# Patient Record
Sex: Male | Born: 1950
Health system: Southern US, Community
[De-identification: ages and names within clinical notes are randomized; demographics above are authoritative.]

## PROBLEM LIST (undated history)

## (undated) DIAGNOSIS — M109 Gout, unspecified: Secondary | ICD-10-CM

## (undated) DIAGNOSIS — I8500 Esophageal varices without bleeding: Secondary | ICD-10-CM

## (undated) DIAGNOSIS — K3189 Other diseases of stomach and duodenum: Secondary | ICD-10-CM

## (undated) DIAGNOSIS — M1712 Unilateral primary osteoarthritis, left knee: Secondary | ICD-10-CM

## (undated) DIAGNOSIS — N200 Calculus of kidney: Secondary | ICD-10-CM

## (undated) DIAGNOSIS — R7309 Other abnormal glucose: Secondary | ICD-10-CM

## (undated) DIAGNOSIS — E079 Disorder of thyroid, unspecified: Secondary | ICD-10-CM

## (undated) DIAGNOSIS — K317 Polyp of stomach and duodenum: Secondary | ICD-10-CM

## (undated) DIAGNOSIS — R9431 Abnormal electrocardiogram [ECG] [EKG]: Secondary | ICD-10-CM

## (undated) DIAGNOSIS — I4891 Unspecified atrial fibrillation: Secondary | ICD-10-CM

## (undated) DIAGNOSIS — Z96659 Presence of unspecified artificial knee joint: Secondary | ICD-10-CM

## (undated) DIAGNOSIS — E785 Hyperlipidemia, unspecified: Secondary | ICD-10-CM

## (undated) HISTORY — DX: Other abnormal glucose: R73.09

## (undated) HISTORY — DX: Calculus of kidney: N20.0

## (undated) HISTORY — PX: APPENDECTOMY: SHX54

## (undated) HISTORY — DX: Unspecified atrial fibrillation: I48.91

## (undated) HISTORY — DX: Abnormal electrocardiogram (ECG) (EKG): R94.31

## (undated) HISTORY — PX: FINGER SURGERY: SHX640

## (undated) HISTORY — DX: Unilateral primary osteoarthritis, left knee: M17.12

## (undated) HISTORY — DX: Gout, unspecified: M10.9

## (undated) HISTORY — DX: Presence of unspecified artificial knee joint: Z96.659

## (undated) HISTORY — PX: OTHER SURGICAL HISTORY: SHX169

## (undated) HISTORY — DX: Hyperlipidemia, unspecified: E78.5

## (undated) HISTORY — DX: Disorder of thyroid, unspecified: E07.9

---

## 2000-08-26 ENCOUNTER — Encounter (INDEPENDENT_AMBULATORY_CARE_PROVIDER_SITE_OTHER): Payer: Self-pay | Admitting: Internal Medicine

## 2000-08-26 LAB — CONVERTED CEMR LAB
Blood Glucose, Fasting: 88 mg/dL
RBC count: 5.29 10*6/uL
WBC, blood: 6.2 10*3/uL

## 2004-12-28 ENCOUNTER — Ambulatory Visit: Payer: Self-pay | Admitting: Family Medicine

## 2005-01-22 ENCOUNTER — Ambulatory Visit: Payer: Self-pay | Admitting: Cardiology

## 2005-01-22 ENCOUNTER — Ambulatory Visit: Payer: Self-pay | Admitting: Internal Medicine

## 2005-01-22 ENCOUNTER — Inpatient Hospital Stay (HOSPITAL_COMMUNITY): Admission: EM | Admit: 2005-01-22 | Discharge: 2005-01-24 | Payer: Self-pay | Admitting: Emergency Medicine

## 2005-01-22 IMAGING — CR DG CHEST 1V PORT
1 series · 1 of 1 positions shown · non-contrast
Comparison: None.

CLINICAL DATA: Chest pain.

PORTABLE CHEST - 1 VIEW

[view not recorded]
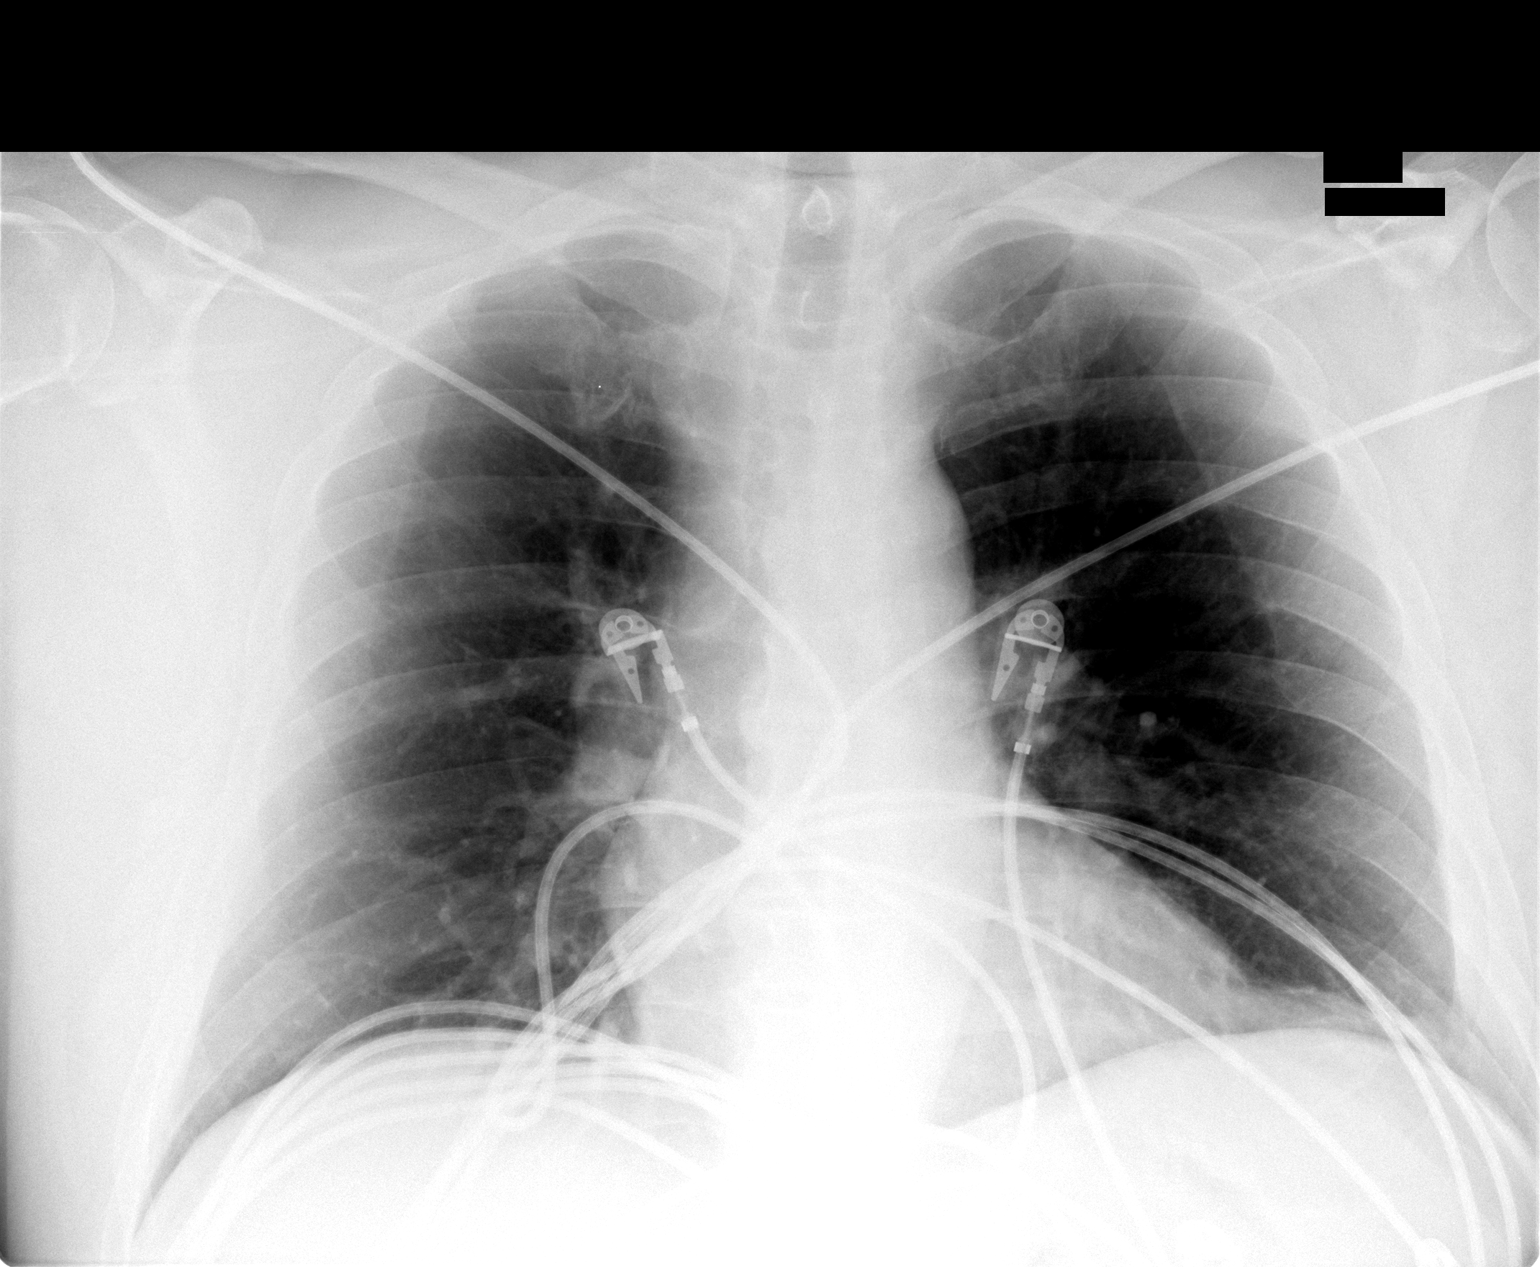

[1 of 1 positions shown; findings below may reference images not displayed]

FINDINGS: Normal sized heart. Mild left basilar linear density. Otherwise,
clear lungs with normal vascularity. Mild thoracic spine degenerative changes.  

IMPRESSION

Mild left basilar atelectasis or scarring.

## 2005-01-23 HISTORY — PX: CARDIAC CATHETERIZATION: SHX172

## 2005-02-12 ENCOUNTER — Ambulatory Visit: Payer: Self-pay | Admitting: Family Medicine

## 2005-02-12 ENCOUNTER — Encounter (INDEPENDENT_AMBULATORY_CARE_PROVIDER_SITE_OTHER): Payer: Self-pay | Admitting: Internal Medicine

## 2005-02-12 LAB — CONVERTED CEMR LAB
Blood Glucose, Fasting: 96 mg/dL
PSA: 1.56 ng/mL

## 2005-03-25 ENCOUNTER — Ambulatory Visit: Payer: Self-pay | Admitting: Family Medicine

## 2005-05-23 ENCOUNTER — Ambulatory Visit: Payer: Self-pay | Admitting: Family Medicine

## 2006-08-12 ENCOUNTER — Ambulatory Visit: Payer: Self-pay | Admitting: Family Medicine

## 2006-11-10 ENCOUNTER — Ambulatory Visit: Payer: Self-pay | Admitting: Family Medicine

## 2006-11-10 DIAGNOSIS — M79609 Pain in unspecified limb: Secondary | ICD-10-CM | POA: Insufficient documentation

## 2006-12-17 ENCOUNTER — Encounter (INDEPENDENT_AMBULATORY_CARE_PROVIDER_SITE_OTHER): Payer: Self-pay | Admitting: Internal Medicine

## 2007-01-21 ENCOUNTER — Telehealth (INDEPENDENT_AMBULATORY_CARE_PROVIDER_SITE_OTHER): Payer: Self-pay | Admitting: *Deleted

## 2007-06-18 ENCOUNTER — Ambulatory Visit: Payer: Self-pay | Admitting: Family Medicine

## 2007-06-18 DIAGNOSIS — IMO0002 Reserved for concepts with insufficient information to code with codable children: Secondary | ICD-10-CM | POA: Insufficient documentation

## 2007-06-19 ENCOUNTER — Ambulatory Visit: Payer: Self-pay | Admitting: Family Medicine

## 2007-07-27 ENCOUNTER — Telehealth (INDEPENDENT_AMBULATORY_CARE_PROVIDER_SITE_OTHER): Payer: Self-pay | Admitting: *Deleted

## 2007-11-25 ENCOUNTER — Telehealth: Payer: Self-pay | Admitting: Family Medicine

## 2008-01-22 ENCOUNTER — Ambulatory Visit: Payer: Self-pay | Admitting: Family Medicine

## 2008-01-22 DIAGNOSIS — M109 Gout, unspecified: Secondary | ICD-10-CM | POA: Insufficient documentation

## 2008-02-15 ENCOUNTER — Encounter: Admission: RE | Admit: 2008-02-15 | Discharge: 2008-02-15 | Payer: Self-pay | Admitting: Family Medicine

## 2008-02-15 ENCOUNTER — Ambulatory Visit: Payer: Self-pay | Admitting: Family Medicine

## 2008-02-15 DIAGNOSIS — N200 Calculus of kidney: Secondary | ICD-10-CM

## 2008-02-15 DIAGNOSIS — IMO0002 Reserved for concepts with insufficient information to code with codable children: Secondary | ICD-10-CM | POA: Insufficient documentation

## 2008-02-15 HISTORY — DX: Calculus of kidney: N20.0

## 2008-02-15 LAB — CONVERTED CEMR LAB
Bilirubin Urine: NEGATIVE
Blood in Urine, dipstick: NEGATIVE
Glucose, Urine, Semiquant: NEGATIVE
Ketones, urine, test strip: NEGATIVE
Protein, U semiquant: NEGATIVE
RBC / HPF: 0

## 2008-02-16 ENCOUNTER — Encounter (INDEPENDENT_AMBULATORY_CARE_PROVIDER_SITE_OTHER): Payer: Self-pay | Admitting: Internal Medicine

## 2008-05-13 ENCOUNTER — Ambulatory Visit: Payer: Self-pay | Admitting: Family Medicine

## 2008-08-18 ENCOUNTER — Telehealth (INDEPENDENT_AMBULATORY_CARE_PROVIDER_SITE_OTHER): Payer: Self-pay | Admitting: Internal Medicine

## 2008-08-30 ENCOUNTER — Ambulatory Visit: Payer: Self-pay | Admitting: Family Medicine

## 2008-08-30 DIAGNOSIS — R071 Chest pain on breathing: Secondary | ICD-10-CM | POA: Insufficient documentation

## 2008-09-02 ENCOUNTER — Encounter: Payer: Self-pay | Admitting: Internal Medicine

## 2009-03-01 ENCOUNTER — Encounter: Payer: Self-pay | Admitting: Family Medicine

## 2009-03-02 ENCOUNTER — Encounter: Payer: Self-pay | Admitting: Family Medicine

## 2009-04-11 ENCOUNTER — Emergency Department (HOSPITAL_BASED_OUTPATIENT_CLINIC_OR_DEPARTMENT_OTHER): Admission: EM | Admit: 2009-04-11 | Discharge: 2009-04-11 | Payer: Self-pay | Admitting: Emergency Medicine

## 2009-04-11 ENCOUNTER — Ambulatory Visit: Payer: Self-pay | Admitting: Diagnostic Radiology

## 2009-04-14 ENCOUNTER — Telehealth: Payer: Self-pay | Admitting: Family Medicine

## 2009-04-14 ENCOUNTER — Encounter (INDEPENDENT_AMBULATORY_CARE_PROVIDER_SITE_OTHER): Payer: Self-pay | Admitting: Internal Medicine

## 2009-04-17 ENCOUNTER — Encounter (INDEPENDENT_AMBULATORY_CARE_PROVIDER_SITE_OTHER): Payer: Self-pay | Admitting: Internal Medicine

## 2009-05-25 ENCOUNTER — Encounter (INDEPENDENT_AMBULATORY_CARE_PROVIDER_SITE_OTHER): Payer: Self-pay | Admitting: Internal Medicine

## 2009-07-03 ENCOUNTER — Encounter: Payer: Self-pay | Admitting: Cardiovascular Disease

## 2009-07-03 ENCOUNTER — Encounter: Payer: Self-pay | Admitting: Family Medicine

## 2009-07-04 ENCOUNTER — Encounter (INDEPENDENT_AMBULATORY_CARE_PROVIDER_SITE_OTHER): Payer: Self-pay | Admitting: Internal Medicine

## 2009-07-04 ENCOUNTER — Ambulatory Visit: Payer: Self-pay | Admitting: Family Medicine

## 2009-07-04 DIAGNOSIS — E785 Hyperlipidemia, unspecified: Secondary | ICD-10-CM

## 2009-07-04 DIAGNOSIS — R9431 Abnormal electrocardiogram [ECG] [EKG]: Secondary | ICD-10-CM

## 2009-07-04 HISTORY — DX: Abnormal electrocardiogram (ECG) (EKG): R94.31

## 2009-07-04 HISTORY — DX: Hyperlipidemia, unspecified: E78.5

## 2009-07-06 ENCOUNTER — Encounter (INDEPENDENT_AMBULATORY_CARE_PROVIDER_SITE_OTHER): Payer: Self-pay | Admitting: Internal Medicine

## 2009-07-06 LAB — CONVERTED CEMR LAB
Alkaline Phosphatase: 85 units/L (ref 39–117)
BUN: 14 mg/dL (ref 6–23)
Bilirubin, Direct: 0.1 mg/dL (ref 0.0–0.3)
CO2: 30 meq/L (ref 19–32)
Calcium: 9.2 mg/dL (ref 8.4–10.5)
Creatinine, Ser: 1.1 mg/dL (ref 0.4–1.5)
Glucose, Bld: 88 mg/dL (ref 70–99)
TSH: 3.88 microintl units/mL (ref 0.35–5.50)
Total Bilirubin: 0.8 mg/dL (ref 0.3–1.2)
Total CHOL/HDL Ratio: 7
VLDL: 32.2 mg/dL (ref 0.0–40.0)

## 2009-07-08 HISTORY — PX: TOTAL KNEE ARTHROPLASTY: SHX125

## 2009-07-10 ENCOUNTER — Ambulatory Visit: Payer: Self-pay | Admitting: Cardiovascular Disease

## 2009-07-16 ENCOUNTER — Encounter (INDEPENDENT_AMBULATORY_CARE_PROVIDER_SITE_OTHER): Payer: Self-pay | Admitting: Internal Medicine

## 2009-07-25 ENCOUNTER — Ambulatory Visit: Payer: Self-pay | Admitting: Family Medicine

## 2009-07-26 LAB — CONVERTED CEMR LAB
BUN: 14 mg/dL (ref 6–23)
Basophils Relative: 0.3 % (ref 0.0–3.0)
CO2: 28 meq/L (ref 19–32)
Chloride: 106 meq/L (ref 96–112)
Eosinophils Absolute: 0.3 10*3/uL (ref 0.0–0.7)
Eosinophils Relative: 5.4 % — ABNORMAL HIGH (ref 0.0–5.0)
HCT: 42.9 % (ref 39.0–52.0)
Hemoglobin: 13.7 g/dL (ref 13.0–17.0)
MCHC: 32 g/dL (ref 30.0–36.0)
MCV: 79.4 fL (ref 78.0–100.0)
Monocytes Absolute: 0.7 10*3/uL (ref 0.1–1.0)
Neutro Abs: 2.9 10*3/uL (ref 1.4–7.7)
Potassium: 3.9 meq/L (ref 3.5–5.1)
RBC: 5.4 M/uL (ref 4.22–5.81)

## 2009-07-27 ENCOUNTER — Ambulatory Visit: Payer: Self-pay | Admitting: Family Medicine

## 2009-07-27 DIAGNOSIS — R7309 Other abnormal glucose: Secondary | ICD-10-CM | POA: Insufficient documentation

## 2009-07-27 HISTORY — DX: Other abnormal glucose: R73.09

## 2009-07-31 ENCOUNTER — Telehealth: Payer: Self-pay | Admitting: Family Medicine

## 2009-08-07 ENCOUNTER — Inpatient Hospital Stay (HOSPITAL_COMMUNITY): Admission: RE | Admit: 2009-08-07 | Discharge: 2009-08-09 | Payer: Self-pay | Admitting: Orthopedic Surgery

## 2009-08-07 DIAGNOSIS — Z96659 Presence of unspecified artificial knee joint: Secondary | ICD-10-CM

## 2009-08-07 HISTORY — DX: Presence of unspecified artificial knee joint: Z96.659

## 2009-12-26 ENCOUNTER — Ambulatory Visit: Payer: Self-pay | Admitting: Family Medicine

## 2009-12-26 DIAGNOSIS — K644 Residual hemorrhoidal skin tags: Secondary | ICD-10-CM | POA: Insufficient documentation

## 2010-02-12 ENCOUNTER — Encounter (INDEPENDENT_AMBULATORY_CARE_PROVIDER_SITE_OTHER): Payer: Self-pay | Admitting: *Deleted

## 2010-08-09 NOTE — Letter (Signed)
Summary: Coronary Risk Screening/[facility]  Coronary Risk Screening/[employer]   Imported By: Lanelle Bal 07/11/2009 09:27:30  _____________________________________________________________________  External Attachment:    Type:   Image     Comment:   External Document

## 2010-08-09 NOTE — Assessment & Plan Note (Signed)
Summary: discuss lab results   Vital Signs:  Patient profile:   60 year old male Weight:      235 pounds Temp:     98.2 degrees F oral Pulse rate:   80 / minute Pulse rhythm:   regular BP sitting:   140 / 88  (left arm) Cuff size:   large  Vitals Entered By: Sydell Axon LPN (July 27, 2009 8:01 AM) CC: Discuss result lab results   History of Present Illness: Pt here to discuss lab results of preop labs. His fasting sugar was high. CBC ok.  Problems Prior to Update: 1)  Hyperlipidemia  (ICD-272.4) 2)  Abnormal Electrocardiogram  (ICD-794.31) 3)  Preoperative Examination  (ICD-V72.84) 4)  Chest Wall Pain, Acute  (ICD-786.52) 5)  Back Pain, Lumbar  (ICD-724.2) 6)  Renal Calculus  (ICD-592.0) 7)  Gout  (ICD-274.9) 8)  Cellulitis and Abscess of Upper Arm and Forearm  (ICD-682.3) 9)  Heel Pain, Right  (ICD-729.5)  Medications Prior to Update: 1)  Indocin 50mg  .... 1 Every 8 Hours As Needed Gout Pain 2)  Allopurinol 100 Mg  Tabs (Allopurinol) .[name].. 1-2 Once Daily For Gout By Mouth As Needed 3)  Ibu 800 Mg  Tabs (Ibuprofen) .[name].. 1 Every 8 Hours With Foood For Pain 4)  Soma 250 Mg Tabs (Carisoprodol) .[name].. 1 Up Ro 4 Times A Day For Muscle Spasm 5)  Simvastatin 40 Mg Tabs (Simvastatin) .... Take 1 Each Day For Cholesterol 6)  Colchicine 0.6 Mg Tabs (Colchicine) .... Take 1-2 At Onset of Pain--Gout, May Repeat 1 Tab Every 2 Hrs Up To 3tabs or Until Diarrhea Starts  Allergies: No Known Drug Allergies  Physical Exam  General:  alert, well-developed, well-nourished, well-hydrated, and overweight-appearing.  NAD No further exam.   Impression & Recommendations:  Problem # 1:  HYPERGLYCEMIA (ICD-790.29) Assessment New  Discussed with pt whose wife is diabetic.  Discussed eating small amts frequently and avoiding sweets and carbs. Exercise regularly top lose qweight. Discussed what sweets and carbs are and strategy therein. Pt understood. Kidney fctn, metabolites and CBC all  ok.  Labs Reviewed: Creat: 1.2 (07/25/2009)     Complete Medication List: 1)  Indocin 50mg   .... 1 every 8 hours as needed gout pain 2)  Allopurinol 100 Mg Tabs (Allopurinol) .[name].. 1-2 once daily for gout by mouth as needed 3)  Ibu 800 Mg Tabs (Ibuprofen) .[name].. 1 every 8 hours with food for pain as needed 4)  Soma 250 Mg Tabs (Carisoprodol) .[name].. 1 up ro 4 times a day for muscle spasm 5)  Simvastatin 40 Mg Tabs (Simvastatin) .... Take 1 each day for cholesterol 6)  Colchicine 0.6 Mg Tabs (Colchicine) .... Take 1-2 at onset of pain--gout, may repeat 1 tab every 2 hrs up to 3tabs or until diarrhea starts  Patient Instructions: 1)  15 mins spent in counselling.  Current Allergies (reviewed today): No known allergies

## 2010-08-09 NOTE — Assessment & Plan Note (Signed)
Summary: NP6/AMD   Visit Type:  surgery clearance Referring Provider:  Everrett Coombe, NP Primary Provider:  Shaune Leeks MD  CC:  no complaints and no past heart problems.  History of Present Illness: 60 year old gentleman presents for initial evaluation of abnormal EKG prior to upcoming knee replacement. He has no history of cardiac problems. He presented in 2006 with chest pain and underwent cardiac catheterization demonstrating minor coronary plaque without obstructive CAD.  The patient denies chest pain, dyspnea, orthopnea, PND, edema, palpitations, lightheadedness, or syncope. He has been active until knee problems have progressed, and he is now limited by knee pain.  Preventive Screening-Counseling & Management  Alcohol-Tobacco     Alcohol drinks/day: 0     Smoking Status: never  Caffeine-Diet-Exercise     Caffeine use/day: 2 cups a day     Does Patient Exercise: not alot due to knee  Current Medications (verified): 1)  Indocin 50mg  .... 1 Every 8 Hours As Needed Gout Pain 2)  Allopurinol 100 Mg  Tabs (Allopurinol) .[name].. 1-2 Once Daily For Gout By Mouth As Needed 3)  Ibu 800 Mg  Tabs (Ibuprofen) .[name].. 1 Every 8 Hours With Foood For Pain 4)  Soma 250 Mg Tabs (Carisoprodol) .[name].. 1 Up Ro 4 Times A Day For Muscle Spasm 5)  Simvastatin 40 Mg Tabs (Simvastatin) .... Take 1 Each Day For Cholesterol 6)  Colchicine 0.6 Mg Tabs (Colchicine) .... Take 1-2 At Onset of Pain--Gout, May Repeat 1 Tab Every 2 Hrs Up To 3tabs or Until Diarrhea Starts  Allergies (verified): No Known Drug Allergies  Past History:  Past medical, surgical, family and social histories (including risk factors) reviewed, and no changes noted (except as noted below).  Past Medical History: Asthma  Past Surgical History: Reviewed history from 09/02/2008 and no changes required. ORTHOSCOPY RIGH AND LEFT KNEE  APPENDECTOMY AT AGE 38 CARDIAC CATHERIZATION NORMAL-(01/23/2005)--neg  Family History: Reviewed  history from 09/02/2008 and no changes required. Father: deceased 38 -  stroke Mother: living 5 - forgetful; smoke Siblings: 1 brother - healthy  Social History: Reviewed history from 07/04/2009 and no changes required. 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 Alcohol drinks/day:  0 Caffeine use/day:  2 cups a day Does Patient Exercise:  not alot due to knee  Review of Systems       Negative except as per HPI   Vital Signs:  Patient profile:   60 year old male Height:      71 inches Weight:      230 pounds BMI:     32.19 Pulse rate:   68 / minute Pulse rhythm:   regular Resp:     16 per minute BP sitting:   136 / 94  (left arm) Cuff size:   regular  Vitals Entered By: Mercer Pod (July 10, 2009 3:09 PM)  Physical Exam  General:  Pt is well-developed, obese male, alert and oriented, no acute distress HEENT: normal Neck: no thyromegaly           JVP normal, carotid upstrokes normal without bruits Lungs: CTA Chest: equal expansion  CV: Apical impulse nondisplaced, RRR without murmur or gallop Abd: soft, NT, positive BS, no HSM, no bruit Back: no CVA tenderness Ext: no clubbing, cyanosis, or edema        femoral pulses 2+ without bruits        pedal pulses 2+ and equal Skin: warm,  dry, no rash Neuro: CNII-XII intact,strength 5/5 = b/l    EKG  Procedure date:  07/03/2009  Findings:      NSR, nonspecific Twave abnormality, hr 88 bpm  Impression & Recommendations:  Problem # 1:  ABNORMAL ELECTROCARDIOGRAM (ICD-794.31) EKG changes are nonspecific. Pt has no cardiac symptoms at present and had a cardiac cath 5 years ago demonstrating patent coronaries with minor nonobstructive CAD - the cath report was reviewed. He is at low risk of cardiac complications from his upcoming surgery and can proceed without further testing.  Problem # 2:  HYPERLIPIDEMIA (ICD-272.4) LDL 180  mg/dL - started on simvastatin after lab result obtained. Followed by Everrett Coombe. His updated medication list for this problem includes:    Simvastatin 40 Mg Tabs (Simvastatin) .[name]... Take 1 each day for cholesterol  CHOL: 235 (07/04/2009)   HDL: 32.40 (07/04/2009)   TG: 161.0 (07/04/2009)  Patient Instructions: 1)  Your physician recommends that you schedule a follow-up appointment as needed. 2)  Your physician recommends that you continue on your current medications as directed. Please refer to the Current Medication list given to you today. 3)  You have been cleared from a cardiac standpoint to proceed with surgery.

## 2010-08-09 NOTE — Letter (Signed)
Summary: Generic Letter  [place] at [facility]  [address] [place], [place] [address]   Phone: [phone]  Fax: [phone]    07/16/2009  Joel Ford [address] [place], [place]  [address]  Dear Mr. Donlan,           Sincerely,   Billie-Lynn Darryle Dennie FNP

## 2010-08-09 NOTE — Letter (Signed)
Summary: Schaller Retirement letter  [place] at [place]  [address]   [place], [place] [address]   Phone: [phone]  Fax: [phone]       02/12/2010 MRN: [id number]  Joel Ford [address] [place], [place]  [address]  Dear [name],  [facility] [place], and [place] announce the retirement of ROBERT N. SCHALLER, M.D., from full-time practice at the [place] office effective January 04, 2010 and his plans of returning part-time.  It is important to Dr. Schaller and to our practice that you understand that [facility] [place] has seven physicians in our office for your health care needs.  We will continue to offer the same exceptional care that you have today.    Dr. Schaller has spoken to many of you about his plans for retirement and returning part-time in the fall.   We will continue to work with you through the transition to schedule appointments for you in the office and meet the high standards that [place] is committed to.   Again, it is with great pleasure that we share the news that Dr. Schaller will return to [facility] [place] in October of 2011 with a reduced schedule.    If you have any questions, or would like to request an appointment with one of our physicians, please call us at [phone] and press the option for Scheduling an appointment.  We take pleasure in providing you with excellent patient care and look forward to seeing you at your next office visit.  Our [place] Physicians are:  Richard Letvak, M.D. Robert Schaller, M.D. Marne Tower, M.D. Amy Bedsole, M.D. Spencer Copland, M.D. Talia Aron, M.D. We proudly welcomed Shaw Duncan, M.D. and Javier Gutierrez, M.D. to the practice in July/August 2011.  Sincerely,  [facility] [place] 

## 2010-08-09 NOTE — Letter (Signed)
Summary: Generic Letter   at [facility]  [address] [place], [place] [address]   Phone: [phone]  Fax: [phone]        Dr. Salvatore Marvel [address] [place], [place] [address]    07/16/2009   RE: SHAQUELLE HERNON     [address]     [place], [place]  [address]   Dear Dr. Thurston Hole,  Margorie John Therman Hughlett is a patient in the [facility] office of [facility] and was seen 07/04/2009 for medical clearance for up coming knee replacement to be done by you.  He had an abnormal EKG at the office visit and was referred to Dr Tonny Bollman, cardiologist for further evaluation. HE was seen on 07/10/2009.  He has had a cardiac cath in 2006 which was negative following an episode of chest pain.  He has newly diagnosed hyperlipidemia and has been started on Simvastatin.  He was deemed cleared for surgery by Dr Excell Seltzer due to his low risk.  His chest x-ray is negative and he is scheduled in for labs the week prior to surgery.  A copy of his xray and Dr Earmon Phoenix note are attached to this letter and he will be given  copies, as well as the labs to take to anesthesia.  If you need any further assistance, please call and either speak to Dr. Hetty Ely or Dr. Milinda Antis as I have retired.  May I add, it has always been a pleasure working with you and your staff.   Sincerely,   Billie-Lynn Kabrea Seeney FNP

## 2010-08-09 NOTE — Progress Notes (Signed)
Summary: Info for anesthesia appt  Phone Note Outgoing Call   Call placed by: Lewanda Rife Call placed to: Patient Summary of Call: Pt envelope to take to anethesia appt is ready and at front desk. Pt said would pick up in AM. Initial call taken by: Lewanda Rife LPN,  July 31, 2009 5:37 PM     Appended Document: Info for anesthesia appt I called back to remind about lab appt on 09/04/09 at 8:50am. Pt's wife said to cancel appt he is having knee surgery and his mom just died. They will reschedule later.

## 2010-08-09 NOTE — Assessment & Plan Note (Signed)
Summary: PROBLEMS WITH HEMROIDS / LFW   Vital Signs:  Patient profile:   60 year old male Weight:      230.75 pounds Temp:     98.5 degrees F oral Pulse rate:   80 / minute Pulse rhythm:   regular BP sitting:   128 / 82  (left arm) Cuff size:   large  Vitals Entered By: Sydell Axon LPN (December 26, 2009 3:29 PM) CC: Problem with hemorrhoids   History of Present Illness: Pt here for hemmorhoids. He was on narcotics a few months ago for knee surgery and developed constipation which reactivated hemms he had had experience with in the remote past. He has been donig better the last day or two but wanted to discuss the situation and have an exam. 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 He has not seen blood but has itching at times.  Problems Prior to Update: 1)  Hyperglycemia  (ICD-790.29) 2)  Hyperlipidemia  (ICD-272.4) 3)  Abnormal Electrocardiogram  (ICD-794.31) 4)  Preoperative Examination  (ICD-V72.84) 5)  Chest Wall Pain, Acute  (ICD-786.52) 6)  Back Pain, Lumbar  (ICD-724.2) 7)  Renal Calculus  (ICD-592.0) 8)  Gout  (ICD-274.9) 9)  Cellulitis and Abscess of Upper Arm and Forearm  (ICD-682.3) 10)  Heel Pain, Right  (ICD-729.5)  Medications Prior to Update: 1)  Indocin 50mg  .... 1 Every 8 Hours As Needed Gout Pain 2)  Allopurinol 100 Mg  Tabs (Allopurinol) .[name].. 1-2 Once Daily For Gout By Mouth As Needed 3)  Ibu 800 Mg  Tabs (Ibuprofen) .[name].. 1 Every 8 Hours With Food For Pain As Needed 4)  Soma 250 Mg Tabs (Carisoprodol) .[name].. 1 Up Ro 4 Times A Day For Muscle Spasm 5)  Simvastatin 40 Mg Tabs (Simvastatin) .... Take 1 Each Day For Cholesterol 6)  Colchicine 0.6 Mg Tabs (Colchicine) .... Take 1-2 At Onset of Pain--Gout, May Repeat 1 Tab Every 2 Hrs Up To 3tabs or Until Diarrhea Starts  Allergies: No Known Drug Allergies  Physical Exam  General:  alert, well-developed, well-nourished, well-hydrated,  and overweight-appearing.  NAD No further exam. Rectal:  Decompressed sizeable hemm at the 12 o'clock position, nontender, non swollen.    Impression & Recommendations:  Problem # 1:  EXTERNAL HEMORRHOIDS (ICD-455.3) Assessment New Recurrence of old problem. Discussed pathophysiology of ext hemmorhoids. Discussed int and vaices as well. See instructions.  Complete Medication List: 1)  Indocin 50mg   .... 1 every 8 hours as needed gout pain 2)  Allopurinol 100 Mg Tabs (Allopurinol) .[name].. 1-2 once daily for gout by mouth as needed 3)  Ibu 800 Mg Tabs (Ibuprofen) .[name].. 1 every 8 hours with food for pain as needed 4)  Soma 250 Mg Tabs (Carisoprodol) .[name].. 1 up ro 4 times a day for muscle spasm 5)  Simvastatin 40 Mg Tabs (Simvastatin) .... Take 1 each day for cholesterol 6)  Colchicine 0.6 Mg Tabs (Colchicine) .... Take 1-2 at onset of pain--gout, may repeat 1 tab every 2 hrs up to 3tabs or until diarrhea starts as needed 7)  Anusol-hc 25 Mg Supp (Hydrocortisone acetate) .... Insert in rectum three times a day for one week then as needed  Patient Instructions: 1)  Use Anusol HC as needed for rectal pain and irritation of hemms. 2)  If inflamed, use minimum of one week and do sitz baths regularly. 3)  If regularly constipated, add  Citrucel one tsp in 8 oz of water in AM chronically. 4)  Increase activity level to get bowels more regular. 5)  Clean rectum after each BM with soap and water, cleansing well after the soap. Prescriptions: ANUSOL-HC 25 MG SUPP (HYDROCORTISONE ACETATE) insert in rectum three times a day for one week then as needed  #30 x 6   Entered and Authorized by:   Shaune Leeks MD   Signed by:   Shaune Leeks MD on 12/26/2009   Method used:   Electronically to        [facility]* (retail)       [address]       [place], [place]  [address]       Ph: [phone]       Fax: [phone]   RxID:   [id number]   Current Allergies (reviewed today): No  known allergies

## 2010-08-09 NOTE — Letter (Signed)
Summary: Nadara Eaton letter  [place] at [facility]  [address] [place], [place] [address]   Phone: [phone]  Fax: [phone]       02/12/2010 MRN: [id number]  Zhamir Wisecup [address] [place], [place]  [address]  Dear Mr. Sheeler,  [facility] [place], and [place] announce the retirement of Arta Silence, M.D., from full-time practice at the [facility] office effective January 04, 2010 and his plans of returning part-time.  It is important to Dr. Hetty Ely and to our practice that you understand that [facility] has seven physicians in our office for your health care needs.  We will continue to offer the same exceptional care that you have today.    Dr. Hetty Ely has spoken to many of you about his plans for retirement and returning part-time in the fall.   We will continue to work with you through the transition to schedule appointments for you in the office and meet the high standards that [facility] is committed to.   Again, it is with great pleasure that we share the news that Dr. Hetty Ely will return to [facility] at [facility] in October of 2011 with a reduced schedule.    If you have any questions, or would like to request an appointment with one of our physicians, please call us at [phone] and press the option for Scheduling an appointment.  We take pleasure in providing you with excellent patient care and look forward to seeing you at your next office visit.  Our [facility] Physicians are:  Tillman Abide, M.D. Laurita Quint, M.D. Roxy Manns, M.D. Kerby Nora, M.D. Hannah Beat, M.D. Ruthe Mannan, M.D. We proudly welcomed Raechel Ache, M.D. and Eustaquio Boyden, M.D. to the practice in July/August 2011.  Sincerely,  [facility]

## 2010-08-09 NOTE — Letter (Signed)
Summary: BMI Screening/[facility]  BMI Screening/[employer]   Imported By: Lanelle Bal 07/11/2009 09:26:25  _____________________________________________________________________  External Attachment:    Type:   Image     Comment:   External Document

## 2010-09-24 LAB — URINE CULTURE
Colony Count: NO GROWTH
Culture: NO GROWTH

## 2010-09-24 LAB — DIFFERENTIAL
Basophils Relative: 0 % (ref 0–1)
Eosinophils Absolute: 0.1 10*3/uL (ref 0.0–0.7)
Eosinophils Relative: 2 % (ref 0–5)
Lymphocytes Relative: 22 % (ref 12–46)
Lymphs Abs: 1.6 10*3/uL (ref 0.7–4.0)
Monocytes Absolute: 0.6 10*3/uL (ref 0.1–1.0)
Monocytes Relative: 8 % (ref 3–12)
Neutrophils Relative %: 69 % (ref 43–77)

## 2010-09-24 LAB — URINALYSIS, ROUTINE W REFLEX MICROSCOPIC
Bilirubin Urine: NEGATIVE
Hgb urine dipstick: NEGATIVE
Ketones, ur: NEGATIVE mg/dL
Protein, ur: NEGATIVE mg/dL
Specific Gravity, Urine: 1.02 (ref 1.005–1.030)
Urobilinogen, UA: 1 mg/dL (ref 0.0–1.0)
pH: 5.5 (ref 5.0–8.0)

## 2010-09-24 LAB — COMPREHENSIVE METABOLIC PANEL
AST: 20 U/L (ref 0–37)
Albumin: 4.1 g/dL (ref 3.5–5.2)
BUN: 12 mg/dL (ref 6–23)
Creatinine, Ser: 1.03 mg/dL (ref 0.4–1.5)
Potassium: 4.3 mEq/L (ref 3.5–5.1)
Total Protein: 7.4 g/dL (ref 6.0–8.3)

## 2010-09-24 LAB — CBC
Hemoglobin: 14.5 g/dL (ref 13.0–17.0)
MCHC: 33.3 g/dL (ref 30.0–36.0)
MCV: 77.2 fL — ABNORMAL LOW (ref 78.0–100.0)
RBC: 5.65 MIL/uL (ref 4.22–5.81)
WBC: 7.4 10*3/uL (ref 4.0–10.5)

## 2010-09-24 LAB — TYPE AND SCREEN
ABO/RH(D): AB POS
Antibody Screen: NEGATIVE

## 2010-09-27 LAB — PROTIME-INR: INR: 1.26 (ref 0.00–1.49)

## 2010-09-27 LAB — CBC
HCT: 33.7 % — ABNORMAL LOW (ref 39.0–52.0)
HCT: 35.6 % — ABNORMAL LOW (ref 39.0–52.0)
Hemoglobin: 11 g/dL — ABNORMAL LOW (ref 13.0–17.0)
Hemoglobin: 11.8 g/dL — ABNORMAL LOW (ref 13.0–17.0)
MCHC: 33.1 g/dL (ref 30.0–36.0)
MCV: 78.9 fL (ref 78.0–100.0)
MCV: 79.9 fL (ref 78.0–100.0)
RBC: 4.45 MIL/uL (ref 4.22–5.81)
WBC: 10.8 10*3/uL — ABNORMAL HIGH (ref 4.0–10.5)
WBC: 9.4 10*3/uL (ref 4.0–10.5)

## 2010-09-27 LAB — BASIC METABOLIC PANEL
BUN: 7 mg/dL (ref 6–23)
BUN: 8 mg/dL (ref 6–23)
Calcium: 7.8 mg/dL — ABNORMAL LOW (ref 8.4–10.5)
Chloride: 103 mEq/L (ref 96–112)
Creatinine, Ser: 1.07 mg/dL (ref 0.4–1.5)
GFR calc Af Amer: >60 mL/min (ref >60)
GFR calc non Af Amer: >60 mL/min (ref >60)
GFR calc non Af Amer: >60 mL/min (ref >60)
Glucose, Bld: 136 mg/dL — ABNORMAL HIGH (ref 70–99)
Potassium: 4 mEq/L (ref 3.5–5.1)
Potassium: 4.3 mEq/L (ref 3.5–5.1)
Sodium: 134 mEq/L — ABNORMAL LOW (ref 135–145)

## 2010-10-11 LAB — URINALYSIS, ROUTINE W REFLEX MICROSCOPIC
Glucose, UA: NEGATIVE mg/dL
Hgb urine dipstick: NEGATIVE
pH: 6.5 (ref 5.0–8.0)

## 2010-10-11 LAB — BASIC METABOLIC PANEL
BUN: 15 mg/dL (ref 6–23)
Calcium: 9 mg/dL (ref 8.4–10.5)
Creatinine, Ser: 1.1 mg/dL (ref 0.4–1.5)
GFR calc non Af Amer: >60 mL/min (ref >60)
Glucose, Bld: 95 mg/dL (ref 70–99)

## 2010-10-11 LAB — URINE CULTURE
Colony Count: NO GROWTH
Culture: NO GROWTH

## 2010-10-11 LAB — CBC
HCT: 43.6 % (ref 39.0–52.0)
Platelets: 203 10*3/uL (ref 150–400)
RDW: 14.3 % (ref 11.5–15.5)
WBC: 6.8 10*3/uL (ref 4.0–10.5)

## 2010-10-11 LAB — DIFFERENTIAL
Basophils Absolute: 0.1 10*3/uL (ref 0.0–0.1)
Eosinophils Relative: 3 % (ref 0–5)
Lymphocytes Relative: 22 % (ref 12–46)
Neutrophils Relative %: 66 % (ref 43–77)

## 2010-11-26 ENCOUNTER — Encounter: Payer: Self-pay | Admitting: Family Medicine

## 2010-11-27 ENCOUNTER — Ambulatory Visit (INDEPENDENT_AMBULATORY_CARE_PROVIDER_SITE_OTHER): Payer: Managed Care, Other (non HMO) | Admitting: Family Medicine

## 2010-11-27 ENCOUNTER — Encounter: Payer: Self-pay | Admitting: Family Medicine

## 2010-11-27 DIAGNOSIS — E785 Hyperlipidemia, unspecified: Secondary | ICD-10-CM

## 2010-11-27 DIAGNOSIS — M109 Gout, unspecified: Secondary | ICD-10-CM

## 2010-11-27 MED ORDER — INDOMETHACIN 50 MG PO CAPS: 50.0000 mg | ORAL_CAPSULE | Freq: Three times a day (TID) | ORAL | Status: DC

## 2010-11-27 MED ORDER — COLCHICINE 0.6 MG PO TABS: 0.6000 mg | ORAL_TABLET | Freq: Every day | ORAL | Status: DC

## 2010-11-27 MED ORDER — ALLOPURINOL 100 MG PO TABS: 100.0000 mg | ORAL_TABLET | Freq: Every day | ORAL | Status: DC

## 2010-11-27 MED ORDER — SIMVASTATIN 40 MG PO TABS: 40.0000 mg | ORAL_TABLET | Freq: Every day | ORAL | Status: DC

## 2010-11-27 NOTE — Assessment & Plan Note (Signed)
Restart colchicine and indomethacin.  Avoid other nsaids.  Take with food.  Start allopurinol after flare is resolved.  List of foods to avoid given to patient.

## 2010-11-27 NOTE — Assessment & Plan Note (Signed)
Restart statin and then return for CPE/labs.

## 2010-11-27 NOTE — Progress Notes (Signed)
Gout pain. L heel pain.  Prev in 1st MTP.  Heel was prev red and tender.  Pain with walking.  No trauma.  "It feels like gout."  HLD- off statin and needs to restart meds.  Due for CPE.    Meds, vitals, and allergies reviewed.   ROS: See HPI.  Otherwise, noncontributory.  Ncat, uncomfortable due to L foot pain rrr ctab L foot with normal inspection and no erythema but ttp at the insertion of the achilles.  Distally NV intact.

## 2010-11-27 NOTE — Patient Instructions (Signed)
Take the indomethacin as needed with food for pain.  Take the colchicine daily when you have a gout flare.  When the pain is gone for about 1 week, start back on the allopurinol.  Look at the list of foods I gave you.  Start back on the cholesterol medicine (simvastatin) and then schedule a physical with labs ahead of time in the next 2-3 months.

## 2011-02-11 ENCOUNTER — Encounter: Payer: Self-pay | Admitting: Family Medicine

## 2011-02-11 ENCOUNTER — Ambulatory Visit (INDEPENDENT_AMBULATORY_CARE_PROVIDER_SITE_OTHER)
Admission: RE | Admit: 2011-02-11 | Discharge: 2011-02-11 | Disposition: A | Discharge status: Home / Self Care | Payer: Managed Care, Other (non HMO) | Source: Ambulatory Visit | Attending: Family Medicine | Admitting: Family Medicine

## 2011-02-11 ENCOUNTER — Ambulatory Visit (INDEPENDENT_AMBULATORY_CARE_PROVIDER_SITE_OTHER): Payer: Managed Care, Other (non HMO) | Admitting: Family Medicine

## 2011-02-11 VITALS — BP 140/90 | HR 80 | Temp 97.6°F | Wt 253.5 lb

## 2011-02-11 DIAGNOSIS — M79646 Pain in unspecified finger(s): Secondary | ICD-10-CM

## 2011-02-11 DIAGNOSIS — M79609 Pain in unspecified limb: Secondary | ICD-10-CM

## 2011-02-11 DIAGNOSIS — E7800 Pure hypercholesterolemia, unspecified: Secondary | ICD-10-CM

## 2011-02-11 DIAGNOSIS — M109 Gout, unspecified: Secondary | ICD-10-CM

## 2011-02-11 MED ORDER — ALLOPURINOL 100 MG PO TABS: 100.0000 mg | ORAL_TABLET | Freq: Every day | ORAL | Status: DC

## 2011-02-11 MED ORDER — HYDROCODONE-ACETAMINOPHEN 5-500 MG PO TABS: 1.0000 | ORAL_TABLET | Freq: Four times a day (QID) | ORAL | Status: AC | PRN

## 2011-02-11 MED ORDER — INDOMETHACIN 50 MG PO CAPS: 50.0000 mg | ORAL_CAPSULE | Freq: Three times a day (TID) | ORAL | Status: DC

## 2011-02-11 MED ORDER — COLCHICINE 0.6 MG PO TABS: 0.6000 mg | ORAL_TABLET | Freq: Every day | ORAL | Status: DC

## 2011-02-11 NOTE — Patient Instructions (Signed)
Use the splint to protect your finger.  Take your regular meds with the vicodin as needed for pain.  It can make you drowsy.  Take care.

## 2011-02-11 NOTE — Progress Notes (Signed)
Started 1 week ago with pain/swelling but minimal redness in R 2nd DIP.  Feels warm. Still puffy.  No trauma.  Pain with flexion at 2nd PIP and DIP.  Ext is okay.    Meds, vitals, and allergies reviewed.   ROS: See HPI.  Otherwise, noncontributory.  nad Normal inspection R wrist and hand except for puffy and tender 2nd DIP, no erythema, minimally warm.  Distally NV intact.   Xrays reviewed.

## 2011-02-12 NOTE — Assessment & Plan Note (Signed)
Likely gout, based on xrays.  Continue routine gout meds with vicodin prn for pain, sedation caution.  DIP splinted in office for protection, tolerated well.

## 2011-02-20 ENCOUNTER — Other Ambulatory Visit: Payer: Managed Care, Other (non HMO)

## 2011-02-28 ENCOUNTER — Encounter: Payer: Managed Care, Other (non HMO) | Admitting: Family Medicine

## 2012-01-31 ENCOUNTER — Encounter: Payer: Self-pay | Admitting: Family Medicine

## 2012-08-30 ENCOUNTER — Encounter: Payer: Self-pay | Admitting: Family Medicine

## 2012-08-30 DIAGNOSIS — M1710 Unilateral primary osteoarthritis, unspecified knee: Secondary | ICD-10-CM | POA: Insufficient documentation

## 2012-08-30 DIAGNOSIS — M179 Osteoarthritis of knee, unspecified: Secondary | ICD-10-CM | POA: Insufficient documentation

## 2012-09-03 ENCOUNTER — Ambulatory Visit (INDEPENDENT_AMBULATORY_CARE_PROVIDER_SITE_OTHER): Payer: 59 | Admitting: Cardiovascular Disease

## 2012-09-03 ENCOUNTER — Encounter: Payer: Self-pay | Admitting: Cardiovascular Disease

## 2012-09-03 VITALS — BP 132/80 | HR 90 | Ht 71.0 in | Wt 238.5 lb

## 2012-09-03 VITALS — BP 136/80 | Ht 71.0 in | Wt 238.5 lb

## 2012-09-03 DIAGNOSIS — Z01810 Encounter for preprocedural cardiovascular examination: Secondary | ICD-10-CM

## 2012-09-03 DIAGNOSIS — Z01818 Encounter for other preprocedural examination: Secondary | ICD-10-CM

## 2012-09-03 DIAGNOSIS — R9431 Abnormal electrocardiogram [ECG] [EKG]: Secondary | ICD-10-CM

## 2012-09-03 NOTE — Patient Instructions (Addendum)
Your stress test is normal.  Follow up as needed.  

## 2012-09-03 NOTE — Procedures (Signed)
    Treadmill Stress test  Indication: Preoperative cardiovascular evaluation and abnormal ECG.  Baseline Data:  Resting EKG shows NSR with rate of 82 bpm, anterolateral and inferior T wave changes suggestive of ischemia. Resting blood pressure of 132/84 mm Hg Stand bruce protocal was used.  Exercise Data:  Patient exercised for 4 min 0 sec,  Peak heart rate of 164 bpm.  This was 103 % of the maximum predicted heart rate. No symptoms of chest pain or lightheadedness were reported at peak stress or in recovery.  Peak Blood pressure recorded was 174/84 Maximal work level: 7 METs.  Heart rate at 3 minutes in recovery was 108 bpm. BP response: Normal HR response: Rapid  EKG with Exercise: Sinus tachycardia with minor 0.5 mg upsloping ST depression in V4 to V6 which is not diagnostic for ischemia.  FINAL IMPRESSION: Normal exercise stress test. No significant EKG changes concerning for ischemia. Fair exercise tolerance.  Recommendation: The patient should be considered at the low risk for cardiovascular complications.

## 2012-09-03 NOTE — Patient Instructions (Addendum)
Your physician has requested that you have an exercise tolerance test. For further information please visit [email or web address]. Please also follow instruction sheet, as given.   

## 2012-09-06 ENCOUNTER — Encounter: Payer: Self-pay | Admitting: Cardiovascular Disease

## 2012-09-06 DIAGNOSIS — Z01810 Encounter for preprocedural cardiovascular examination: Secondary | ICD-10-CM | POA: Insufficient documentation

## 2012-09-06 NOTE — Progress Notes (Signed)
HPI  This is a 62 year old male who is referred for preoperative cardiovascular evaluation before an anticipated left knee arthroplasty scheduled on March 17. He is here due to an abnormal EKG.  He has no history of cardiac problems. He presented in 2006 with chest pain and underwent cardiac catheterization demonstrating minor coronary plaque without obstructive CAD. He was noted to have nonspecific ST changes on his EKG in 2011 before his right knee replacement. He was seen at that time by Dr. Excell Seltzer and no workup was necessary. He underwent the surgery without complications.  His current EKG seems to have more pronounced T wave changes in the inferior and anterolateral leads compared to his previous EKG. However, he has no chest pain or worsening exertional dyspnea. His physical activities are limited due to knee discomfort. Thus, he does not  Engage in any regular exercise. He has history of hyperlipidemia without any other medical problems.  No Known Allergies   Current Outpatient Prescriptions on File Prior to Visit  Medication Sig Dispense Refill  . colchicine 0.6 MG tablet Take 1 tablet (0.6 mg total) by mouth daily. For gout flare.  30 tablet  3  . simvastatin (ZOCOR) 40 MG tablet Take 1 tablet (40 mg total) by mouth at bedtime.  90 tablet  0   No current facility-administered medications on file prior to visit.     Past Medical History  Diagnosis Date  . Asthma   . Gout   . HYPERLIPIDEMIA 07/04/2009    Qualifier: Diagnosis of  By: Jillyn Hidden FNP, Mcarthur Rossetti   . ABNORMAL ELECTROCARDIOGRAM 07/04/2009    Qualifier: Diagnosis of  By: Jillyn Hidden FNP, Mcarthur Rossetti   . HYPERGLYCEMIA 07/27/2009    Qualifier: Diagnosis of  By: Hetty Ely MD, Franne Grip   . RENAL CALCULUS 02/15/2008    Qualifier: Diagnosis of  By: Jillyn Hidden FNP, Mcarthur Rossetti      Past Surgical History  Procedure Laterality Date  . Appendectomy      age 74  . Orthoscopy      Right and left knee  . Total  knee arthroplasty  2011    Right  . Cardiac catheterization  01/23/2005    Normal     Family History  Problem Relation Age of Onset  . Stroke Father      History   Social History  . Marital Status: Married    Spouse Name: N/A    Number of Children: 1  . Years of Education: N/A   Occupational History  . 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 Smoking status: Never Smoker   . Smokeless tobacco: Not on file  . Alcohol Use: No  . Drug Use: No  . Sexually Active: Not on file   Other Topics Concern  . Not on file   Social History Narrative  . No narrative on file     ROS Constitutional: Negative for fever, chills, diaphoresis, activity change, appetite change and fatigue.  HENT: Negative for hearing loss, nosebleeds, congestion, sore throat, facial swelling, drooling, trouble swallowing, neck pain, voice change, sinus pressure and tinnitus.  Eyes: Negative for photophobia, pain, discharge and visual disturbance.  Respiratory: Negative for apnea, cough, chest tightness and wheezing.  Cardiovascular: Negative for chest pain, palpitations and leg swelling.  Gastrointestinal: Negative for nausea, vomiting, abdominal pain, diarrhea, constipation, blood in stool and abdominal distention.  Genitourinary: Negative for dysuria, urgency, frequency, hematuria and decreased urine volume.  Skin: Negative for color change, pallor, rash and wound.  Neurological: Negative for dizziness, tremors, seizures, syncope, speech difficulty, weakness, light-headedness, numbness and headaches.  Psychiatric/Behavioral: Negative for suicidal ideas, hallucinations, behavioral problems and agitation. The patient is not nervous/anxious.     PHYSICAL EXAM   BP 136/80  Ht 5\' 11"  (1.803 m)  Wt 238 lb 8 oz (108.183 kg)  BMI 33.28 kg/m2 Constitutional: He is oriented to person, place, and time. He appears well-developed and well-nourished. No  distress.  HENT: No nasal discharge.  Head: Normocephalic and atraumatic.  Eyes: Pupils are equal and round. Right eye exhibits no discharge. Left eye exhibits no discharge.  Neck: Normal range of motion. Neck supple. No JVD present. No thyromegaly present.  Cardiovascular: Normal rate, regular rhythm, normal heart sounds and. Exam reveals no gallop and no friction rub. No murmur heard.  Pulmonary/Chest: Effort normal and breath sounds normal. No stridor. No respiratory distress. He has no wheezes. He has no rales. He exhibits no tenderness.  Abdominal: Soft. Bowel sounds are normal. He exhibits no distension. There is no tenderness. There is no rebound and no guarding.  Musculoskeletal: Normal range of motion. He exhibits no edema and no tenderness.  Neurological: He is alert and oriented to person, place, and time. Coordination normal.  Skin: Skin is warm and dry. No rash noted. He is not diaphoretic. No erythema. No pallor.  Psychiatric: He has a normal mood and affect. His behavior is normal. Judgment and thought content normal.       EKG: Normal sinus rhythm. The way into work and in the inferior and anterolateral suggestive of ischemia.   ASSESSMENT AND PLAN

## 2012-09-06 NOTE — Assessment & Plan Note (Signed)
The patient has no convincing symptoms of angina. He does report exertional dyspnea which likely reflects physical deconditioning. His current EKG has more abnormalities compared to his previous EKG namely inverted T waves in the inferior and anterolateral leads suggestive of ischemia. Due to that, I decided to proceed with a treadmill stress test to evaluate his symptoms and exercise capacity. He was able to exercise for 4 minutes and achieved greater than 100% of maximal predicted heart rate. He complained of dyspnea and knee discomfort but there was no chest pain. There was no significant ST changes to suggest ischemia. The stress test was normal but there was evidence of physical deconditioning. Due to that, I feel that the patient is at an overall low to moderate risk for the upcoming surgery.

## 2012-09-07 ENCOUNTER — Encounter (HOSPITAL_COMMUNITY): Payer: Self-pay | Admitting: Pharmacy Technician

## 2012-09-07 ENCOUNTER — Ambulatory Visit: Payer: BC Managed Care – PPO | Admitting: Family Medicine

## 2012-09-09 ENCOUNTER — Other Ambulatory Visit: Payer: Self-pay | Admitting: Physician Assistant

## 2012-09-09 ENCOUNTER — Encounter: Payer: Self-pay | Admitting: Physician Assistant

## 2012-09-09 DIAGNOSIS — Z96651 Presence of right artificial knee joint: Secondary | ICD-10-CM

## 2012-09-09 DIAGNOSIS — M1712 Unilateral primary osteoarthritis, left knee: Secondary | ICD-10-CM

## 2012-09-09 DIAGNOSIS — M109 Gout, unspecified: Secondary | ICD-10-CM

## 2012-09-09 NOTE — H&P (Signed)
TOTAL KNEE ADMISSION H&P  Patient is being admitted for left total knee arthroplasty.  Subjective:  Chief Complaint:left knee pain.  HPI: Joel Ford, 62 y.o. male, has a history of pain and functional disability in the left knee due to arthritis and has failed non-surgical conservative treatments for greater than 12 weeks to includeNSAID's and/or analgesics, corticosteriod injections, flexibility and strengthening excercises, weight reduction as appropriate and activity modification.  Onset of symptoms was gradual, starting >10 years ago with gradually worsening course since that time. The patient noted prior procedures on the knee to include  arthroscopy and menisectomy on the left knee(s).  Patient currently rates pain in the left knee(s) at 9 out of 10 with activity. Patient has night pain, worsening of pain with activity and weight bearing, pain that interferes with activities of daily living, crepitus and joint swelling.  Patient has evidence of subchondral sclerosis, periarticular osteophytes and joint space narrowing by imaging studies. There is no active infection.  Patient Active Problem List   Diagnosis Date Noted  . Gout   . Left knee DJD   . Preop cardiovascular exam 09/06/2012  . Arthritis of knee, degenerative 08/30/2012  . EXTERNAL HEMORRHOIDS 12/26/2009  . S/P total knee replacement 08/07/2009  . HYPERGLYCEMIA 07/27/2009  . HYPERLIPIDEMIA 07/04/2009  . ABNORMAL ELECTROCARDIOGRAM 07/04/2009  . CHEST WALL PAIN, ACUTE 08/30/2008  . RENAL CALCULUS 02/15/2008  . BACK PAIN, LUMBAR 02/15/2008  . GOUT 01/22/2008   Past Medical History  Diagnosis Date  . Asthma   . Gout   . HYPERLIPIDEMIA 07/04/2009    Qualifier: Diagnosis of  By: Jillyn Hidden FNP, Mcarthur Rossetti   . ABNORMAL ELECTROCARDIOGRAM 07/04/2009    Qualifier: Diagnosis of  By: Jillyn Hidden FNP, Mcarthur Rossetti   . HYPERGLYCEMIA 07/27/2009    Qualifier: Diagnosis of  By: Hetty Ely MD, Franne Grip   . RENAL CALCULUS  02/15/2008    Qualifier: Diagnosis of  By: Jillyn Hidden FNP, Mcarthur Rossetti   . Left knee DJD   . S/P total knee replacement 08/07/2009    right    Past Surgical History  Procedure Laterality Date  . Appendectomy      age 80  . Orthoscopy      Right and left knee  . Total knee arthroplasty  2011    Right  . Cardiac catheterization  01/23/2005    Normal     (Not in a hospital admission) No Known Allergies   Current Outpatient Prescriptions on File Prior to Visit  Medication Sig Dispense Refill  . lovastatin (MEVACOR) 20 MG tablet Take 20 mg by mouth at bedtime.      . sodium chloride (OCEAN) 0.65 % SOLN nasal spray Place 1 spray into the nose as needed for congestion.      . colchicine 0.6 MG tablet Take 0.6 mg by mouth daily as needed (for gout flares.).       No current facility-administered medications on file prior to visit.     History  Substance Use Topics  . Smoking status: Never Smoker   . Smokeless tobacco: Current User    Types: Chew  . Alcohol Use: No    Family History  Problem Relation Age of Onset  . Stroke Father      Review of Systems  Constitutional: Negative.   HENT: Negative.   Respiratory: Negative.   Cardiovascular: Negative.   Gastrointestinal: Negative.   Genitourinary: Negative.   Musculoskeletal: Positive for joint pain.       Left knee pain  Skin: Negative.   Neurological: Negative.   Endo/Heme/Allergies: Negative.   Psychiatric/Behavioral: Negative.     Objective:  Physical Exam  Constitutional: He is oriented to person, place, and time. He appears well-developed and well-nourished.  HENT:  Head: Normocephalic and atraumatic.  Eyes: EOM are normal. Pupils are equal, round, and reactive to light.  Neck: Neck supple.  Cardiovascular: Normal rate and regular rhythm.  Exam reveals no gallop and no friction rub.   No murmur heard. Respiratory: Effort normal.  GI: Soft.  Musculoskeletal:  He is independently ambulatory with a  moderately antalgic gait and varus deformity in his left knee.  Right knee has no deformity.  Left knee has active range of motion 0-120 degrees.  Varus deformity.  2+ crepitus.  Periarticular osteophytes.  Distal neurovascular exam is intact.  Right knee has well healed total knee incision.  Active range of motion 0-120 degrees.  No pain, swelling or deformity.  He has 2+ dorsalis pedis pulses bilaterally.    Neurological: He is alert and oriented to person, place, and time.  Skin: Skin is warm and dry.  Psychiatric: He has a normal mood and affect.    Vital signs in last 24 hours: Last recorded: 03/05 1300   BP: 162/89 Pulse: 96  Temp: 97.7 F (36.5 C)    Height: 5\' 10"  (1.778 m) SpO2: 96  Weight: 108.863 kg (240 lb)     Labs:   Estimated body mass index is 34.44 kg/(m^2) as calculated from the following:   Height as of this encounter: 5\' 10"  (1.778 m).   Weight as of this encounter: 108.863 kg (240 lb).   Imaging Review Plain radiographs demonstrate severe degenerative joint disease of the left knee(s). The overall alignment issignificant varus. The bone quality appears to be good for age and reported activity level.  Assessment/Plan:  End stage arthritis, left knee  Patient Active Problem List  Diagnosis  . HYPERLIPIDEMIA  . GOUT  . EXTERNAL HEMORRHOIDS  . RENAL CALCULUS  . BACK PAIN, LUMBAR  . CHEST WALL PAIN, ACUTE  . HYPERGLYCEMIA  . ABNORMAL ELECTROCARDIOGRAM  . Arthritis of knee, degenerative  . Preop cardiovascular exam  . Gout  . Left knee DJD  . S/P total knee replacement    The patient history, physical examination, clinical judgment of the provider and imaging studies are consistent with end stage degenerative joint disease of the left knee(s) and total knee arthroplasty is deemed medically necessary. The treatment options including medical management, injection therapy arthroscopy and arthroplasty were discussed at length. The risks and benefits of total  knee arthroplasty were presented and reviewed. The risks due to aseptic loosening, infection, stiffness, patella tracking problems, thromboembolic complications and other imponderables were discussed. The patient acknowledged the explanation, agreed to proceed with the plan and consent was signed. Patient is being admitted for inpatient treatment for surgery, pain control, PT, OT, prophylactic antibiotics, VTE prophylaxis, progressive ambulation and ADL's and discharge planning. The patient is planning to be discharged home with home health services   Kirstin A. Gwinda Passe Physician Assistant [facility] [phone]  09/09/2012, 1:38 PM

## 2012-09-10 ENCOUNTER — Encounter: Payer: Self-pay | Admitting: Family Medicine

## 2012-09-10 ENCOUNTER — Ambulatory Visit (INDEPENDENT_AMBULATORY_CARE_PROVIDER_SITE_OTHER): Payer: 59 | Admitting: Family Medicine

## 2012-09-10 VITALS — BP 146/86 | HR 88 | Temp 98.0°F | Wt 241.0 lb

## 2012-09-10 DIAGNOSIS — IMO0002 Reserved for concepts with insufficient information to code with codable children: Secondary | ICD-10-CM

## 2012-09-10 DIAGNOSIS — E7800 Pure hypercholesterolemia, unspecified: Secondary | ICD-10-CM

## 2012-09-10 DIAGNOSIS — M1710 Unilateral primary osteoarthritis, unspecified knee: Secondary | ICD-10-CM

## 2012-09-10 MED ORDER — LOVASTATIN 20 MG PO TABS: 20.0000 mg | ORAL_TABLET | Freq: Every day | ORAL | Status: DC

## 2012-09-10 NOTE — Patient Instructions (Addendum)
Come back for fasting labs when possible.   I'll send a note to ortho.  Take care.  Skip the dose of cholesterol medicine if you have to take the colchicine.

## 2012-09-10 NOTE — Progress Notes (Signed)
Significant pain from DJD in L knee.  Limiting his activity.  Planned replacement.    Had low risk stress test recently after EKG changes noted. No h/o CVA, MI, TIA, HTN, CHF.  No h/o renal disease, COPD, doesn't smoke.  Was able to tolerate R TKR prev w/o complication.  Now his exercise capacity is limited more by joint pain than anything else.  No CP, mild SOB with exertion (likely due to deconditioning). No BLE edema at the ankles.  No h/o bleeding disorder.  H/o HLD on statin.  No on ASA or coumadin.  H/o hyperglycemia w/o diabetes.    PMH and SH reviewed  ROS: See HPI, otherwise noncontributory.  Meds, vitals, and allergies reviewed.   GEN: nad, alert and oriented HEENT: mucous membranes moist NECK: supple w/o LA CV: rrr PULM: ctab, no inc wob ABD: soft, +bs EXT: no edema SKIN: no acute rash Gait with limp due to L knee pain.

## 2012-09-11 NOTE — Assessment & Plan Note (Signed)
D/w pt about risk/benfits of surgery.  Pain is significant and it is reasonable to proceed with surgery, assuming his preop labs are fine (noted h/o hyperglycemia w/o DM2 dx).  He is appropriately low risk to proceed.  >25 min spent with face to face with patient, >50% counseling and/or coordinating care.

## 2012-09-14 ENCOUNTER — Encounter (HOSPITAL_COMMUNITY): Payer: Self-pay

## 2012-09-14 ENCOUNTER — Encounter (HOSPITAL_COMMUNITY)
Admission: RE | Admit: 2012-09-14 | Discharge: 2012-09-14 | Disposition: A | Discharge status: Home / Self Care | Payer: 59 | Source: Ambulatory Visit | Attending: Physician Assistant | Admitting: Physician Assistant

## 2012-09-14 ENCOUNTER — Encounter (HOSPITAL_COMMUNITY)
Admission: RE | Admit: 2012-09-14 | Discharge: 2012-09-14 | Disposition: A | Discharge status: Home / Self Care | Payer: 59 | Source: Ambulatory Visit | Attending: Orthopedic Surgery | Admitting: Orthopedic Surgery

## 2012-09-14 LAB — PROTIME-INR
INR: 1.04 (ref 0.00–1.49)
Prothrombin Time: 13.5 seconds (ref 11.6–15.2)

## 2012-09-14 LAB — CBC WITH DIFFERENTIAL/PLATELET
HCT: 40 % (ref 39.0–52.0)
Hemoglobin: 12.7 g/dL — ABNORMAL LOW (ref 13.0–17.0)
Lymphs Abs: 1.3 10*3/uL (ref 0.7–4.0)
MCH: 23.3 pg — ABNORMAL LOW (ref 26.0–34.0)
MCHC: 31.8 g/dL (ref 30.0–36.0)
Monocytes Absolute: 0.5 10*3/uL (ref 0.1–1.0)
Monocytes Relative: 8 % (ref 3–12)
Neutro Abs: 3.9 10*3/uL (ref 1.7–7.7)
Neutrophils Relative %: 65 % (ref 43–77)
RBC: 5.44 MIL/uL (ref 4.22–5.81)

## 2012-09-14 LAB — COMPREHENSIVE METABOLIC PANEL
Alkaline Phosphatase: 104 U/L (ref 39–117)
BUN: 17 mg/dL (ref 6–23)
Chloride: 104 mEq/L (ref 96–112)
Creatinine, Ser: 1.02 mg/dL (ref 0.50–1.35)
GFR calc Af Amer: 90 mL/min — ABNORMAL LOW (ref >90)
GFR calc non Af Amer: 77 mL/min — ABNORMAL LOW (ref >90)
Glucose, Bld: 99 mg/dL (ref 70–99)
Potassium: 4.2 mEq/L (ref 3.5–5.1)
Total Bilirubin: 0.3 mg/dL (ref 0.3–1.2)

## 2012-09-14 LAB — URINALYSIS, ROUTINE W REFLEX MICROSCOPIC
Bilirubin Urine: NEGATIVE
Glucose, UA: NEGATIVE mg/dL
Hgb urine dipstick: NEGATIVE
Ketones, ur: NEGATIVE mg/dL
Nitrite: NEGATIVE
Specific Gravity, Urine: 1.023 (ref 1.005–1.030)
pH: 5.5 (ref 5.0–8.0)

## 2012-09-14 NOTE — Pre-Procedure Instructions (Signed)
Yoskar Murrillo [facility]  09/14/2012   Your procedure is scheduled on: Monday 09/21/12   Report to [facility] at 700 AM.  Call this number if you have problems the morning of surgery: [phone]   Remember:   Do not eat food or drink liquids after midnight.   Take these medicines the morning of surgery with A SIP OF WATER: colchicine    Do not wear jewelry, make-up or nail polish.  Do not wear lotions, powders, or perfumes. You may wear deodorant.  Do not shave 48 hours prior to surgery. Men may shave face and neck.  Do not bring valuables to the hospital.  Contacts, dentures or bridgework may not be worn into surgery.  Leave suitcase in the car. After surgery it may be brought to your room.  For patients admitted to the hospital, checkout time is 11:00 AM the day of  discharge.   Patients discharged the day of surgery will not be allowed to drive  home.  Name and phone number of your driver:   Special Instructions: Shower using CHG 2 nights before surgery and the night before surgery.  If you shower the day of surgery use CHG.  Use special wash - you have one bottle of CHG for all showers.  You should use approximately 1/3 of the bottle for each shower.   Please read over the following fact sheets that you were given: Pain Booklet, Coughing and Deep Breathing, Blood Transfusion Information, Total Joint Packet, MRSA Information and Surgical Site Infection Prevention

## 2012-09-15 LAB — URINE CULTURE
Colony Count: NO GROWTH
Culture: NO GROWTH

## 2012-09-18 ENCOUNTER — Ambulatory Visit: Payer: BC Managed Care – PPO | Admitting: Cardiovascular Disease

## 2012-09-20 MED ORDER — DEXTROSE 5 % IV SOLN
3.0000 g | INTRAVENOUS | Status: AC
  Administered 2012-09-21: 3 g via INTRAVENOUS
  Filled 2012-09-20: qty 3000

## 2012-09-21 ENCOUNTER — Encounter (HOSPITAL_COMMUNITY): Payer: Self-pay

## 2012-09-21 ENCOUNTER — Encounter (HOSPITAL_COMMUNITY): Payer: Self-pay | Admitting: Anesthesiology

## 2012-09-21 ENCOUNTER — Ambulatory Visit (HOSPITAL_COMMUNITY): Payer: 59 | Admitting: Anesthesiology

## 2012-09-21 ENCOUNTER — Inpatient Hospital Stay (HOSPITAL_COMMUNITY)
Admission: RE | Admit: 2012-09-21 | Discharge: 2012-09-22 | DRG: 470 | Disposition: A | Discharge status: Home Health | Payer: 59 | Source: Ambulatory Visit | Attending: Orthopedic Surgery | Admitting: Orthopedic Surgery

## 2012-09-21 ENCOUNTER — Encounter (HOSPITAL_COMMUNITY)
Admission: RE | Disposition: A | Discharge status: Home Health | Payer: Self-pay | Source: Ambulatory Visit | Attending: Orthopedic Surgery

## 2012-09-21 DIAGNOSIS — K644 Residual hemorrhoidal skin tags: Secondary | ICD-10-CM | POA: Diagnosis present

## 2012-09-21 DIAGNOSIS — J45909 Unspecified asthma, uncomplicated: Secondary | ICD-10-CM | POA: Diagnosis present

## 2012-09-21 DIAGNOSIS — IMO0002 Reserved for concepts with insufficient information to code with codable children: Secondary | ICD-10-CM | POA: Diagnosis present

## 2012-09-21 DIAGNOSIS — M6289 Other specified disorders of muscle: Secondary | ICD-10-CM | POA: Diagnosis present

## 2012-09-21 DIAGNOSIS — N200 Calculus of kidney: Secondary | ICD-10-CM | POA: Diagnosis present

## 2012-09-21 DIAGNOSIS — E785 Hyperlipidemia, unspecified: Secondary | ICD-10-CM | POA: Diagnosis present

## 2012-09-21 DIAGNOSIS — Z01812 Encounter for preprocedural laboratory examination: Secondary | ICD-10-CM

## 2012-09-21 DIAGNOSIS — M1710 Unilateral primary osteoarthritis, unspecified knee: Principal | ICD-10-CM | POA: Diagnosis present

## 2012-09-21 DIAGNOSIS — M1712 Unilateral primary osteoarthritis, left knee: Secondary | ICD-10-CM | POA: Diagnosis present

## 2012-09-21 DIAGNOSIS — R7309 Other abnormal glucose: Secondary | ICD-10-CM | POA: Diagnosis present

## 2012-09-21 DIAGNOSIS — Z96659 Presence of unspecified artificial knee joint: Secondary | ICD-10-CM

## 2012-09-21 DIAGNOSIS — M109 Gout, unspecified: Secondary | ICD-10-CM | POA: Diagnosis present

## 2012-09-21 DIAGNOSIS — R9431 Abnormal electrocardiogram [ECG] [EKG]: Secondary | ICD-10-CM | POA: Diagnosis present

## 2012-09-21 DIAGNOSIS — Z79899 Other long term (current) drug therapy: Secondary | ICD-10-CM

## 2012-09-21 HISTORY — PX: TOTAL KNEE ARTHROPLASTY: SHX125

## 2012-09-21 LAB — CBC
HCT: 36.4 % — ABNORMAL LOW (ref 39.0–52.0)
Hemoglobin: 11.7 g/dL — ABNORMAL LOW (ref 13.0–17.0)
RBC: 5.03 MIL/uL (ref 4.22–5.81)
WBC: 7.2 10*3/uL (ref 4.0–10.5)

## 2012-09-21 SURGERY — ARTHROPLASTY, KNEE, TOTAL
Anesthesia: Regional | Site: Knee | Laterality: Left | Wound class: Clean

## 2012-09-21 MED ORDER — BUPIVACAINE-EPINEPHRINE PF 0.25-1:200000 % IJ SOLN
INTRAMUSCULAR | Status: AC
  Filled 2012-09-21: qty 30

## 2012-09-21 MED ORDER — ACETAMINOPHEN 10 MG/ML IV SOLN
INTRAVENOUS | Status: DC | PRN
  Administered 2012-09-21: 1000 mg via INTRAVENOUS

## 2012-09-21 MED ORDER — BISACODYL 5 MG PO TBEC
5.0000 mg | DELAYED_RELEASE_TABLET | Freq: Every day | ORAL | Status: DC
  Administered 2012-09-21: 5 mg via ORAL
  Filled 2012-09-21: qty 1

## 2012-09-21 MED ORDER — ACETAMINOPHEN 325 MG PO TABS: 650.0000 mg | ORAL_TABLET | Freq: Four times a day (QID) | ORAL | Status: DC | PRN

## 2012-09-21 MED ORDER — BUPIVACAINE-EPINEPHRINE 0.25% -1:200000 IJ SOLN
INTRAMUSCULAR | Status: DC | PRN
  Administered 2012-09-21: 30 mL

## 2012-09-21 MED ORDER — FENTANYL CITRATE 0.05 MG/ML IJ SOLN
INTRAMUSCULAR | Status: DC | PRN
  Administered 2012-09-21 (×5): 50 ug via INTRAVENOUS

## 2012-09-21 MED ORDER — HYDROMORPHONE HCL PF 1 MG/ML IJ SOLN: 0.5000 mg | INTRAMUSCULAR | Status: DC | PRN

## 2012-09-21 MED ORDER — PHENOL 1.4 % MT LIQD: 1.0000 | OROMUCOSAL | Status: DC | PRN

## 2012-09-21 MED ORDER — ACETAMINOPHEN 10 MG/ML IV SOLN
1000.0000 mg | Freq: Four times a day (QID) | INTRAVENOUS | Status: DC
  Administered 2012-09-21 – 2012-09-22 (×3): 1000 mg via INTRAVENOUS
  Filled 2012-09-21 (×4): qty 100

## 2012-09-21 MED ORDER — BUPIVACAINE-EPINEPHRINE PF 0.5-1:200000 % IJ SOLN
INTRAMUSCULAR | Status: DC | PRN
  Administered 2012-09-21: 30 mL

## 2012-09-21 MED ORDER — ALUM & MAG HYDROXIDE-SIMETH 200-200-20 MG/5ML PO SUSP: 30.0000 mL | ORAL | Status: DC | PRN

## 2012-09-21 MED ORDER — ACETAMINOPHEN 650 MG RE SUPP: 650.0000 mg | Freq: Four times a day (QID) | RECTAL | Status: DC | PRN

## 2012-09-21 MED ORDER — MENTHOL 3 MG MT LOZG: 1.0000 | LOZENGE | OROMUCOSAL | Status: DC | PRN

## 2012-09-21 MED ORDER — DEXAMETHASONE SODIUM PHOSPHATE 4 MG/ML IJ SOLN
INTRAMUSCULAR | Status: DC | PRN
  Administered 2012-09-21: 8 mg via INTRAVENOUS

## 2012-09-21 MED ORDER — MIDAZOLAM HCL 2 MG/2ML IJ SOLN
INTRAMUSCULAR | Status: AC
  Administered 2012-09-21: 2 mg
  Filled 2012-09-21: qty 2

## 2012-09-21 MED ORDER — CEFAZOLIN SODIUM-DEXTROSE 2-3 GM-% IV SOLR
2.0000 g | Freq: Four times a day (QID) | INTRAVENOUS | Status: AC
  Administered 2012-09-21 (×2): 2 g via INTRAVENOUS
  Filled 2012-09-21 (×2): qty 50

## 2012-09-21 MED ORDER — OXYCODONE HCL 5 MG PO TABS: 5.0000 mg | ORAL_TABLET | Freq: Once | ORAL | Status: DC | PRN

## 2012-09-21 MED ORDER — METOCLOPRAMIDE HCL 5 MG/ML IJ SOLN: 5.0000 mg | Freq: Three times a day (TID) | INTRAMUSCULAR | Status: DC | PRN

## 2012-09-21 MED ORDER — ACETAMINOPHEN 10 MG/ML IV SOLN
INTRAVENOUS | Status: AC
  Filled 2012-09-21: qty 100

## 2012-09-21 MED ORDER — CHLORHEXIDINE GLUCONATE 4 % EX LIQD: 60.0000 mL | Freq: Once | CUTANEOUS | Status: DC

## 2012-09-21 MED ORDER — ONDANSETRON HCL 4 MG/2ML IJ SOLN
INTRAMUSCULAR | Status: DC | PRN
  Administered 2012-09-21: 4 mg via INTRAVENOUS

## 2012-09-21 MED ORDER — CELECOXIB 200 MG PO CAPS
200.0000 mg | ORAL_CAPSULE | Freq: Two times a day (BID) | ORAL | Status: DC
  Administered 2012-09-21 – 2012-09-22 (×3): 200 mg via ORAL
  Filled 2012-09-21 (×4): qty 1

## 2012-09-21 MED ORDER — COLCHICINE 0.6 MG PO TABS: 0.6000 mg | ORAL_TABLET | Freq: Every day | ORAL | Status: DC | PRN

## 2012-09-21 MED ORDER — CEFUROXIME SODIUM 1.5 G IJ SOLR
INTRAMUSCULAR | Status: DC | PRN
  Administered 2012-09-21: 1.5 g

## 2012-09-21 MED ORDER — FENTANYL CITRATE 0.05 MG/ML IJ SOLN
INTRAMUSCULAR | Status: AC
  Administered 2012-09-21: 100 ug
  Filled 2012-09-21: qty 2

## 2012-09-21 MED ORDER — ENOXAPARIN SODIUM 30 MG/0.3ML ~~LOC~~ SOLN
30.0000 mg | Freq: Two times a day (BID) | SUBCUTANEOUS | Status: DC
  Administered 2012-09-22: 30 mg via SUBCUTANEOUS
  Filled 2012-09-21 (×3): qty 0.3

## 2012-09-21 MED ORDER — ARTIFICIAL TEARS OP OINT
TOPICAL_OINTMENT | OPHTHALMIC | Status: DC | PRN
  Administered 2012-09-21: 1 via OPHTHALMIC

## 2012-09-21 MED ORDER — LIDOCAINE HCL (CARDIAC) 20 MG/ML IV SOLN
INTRAVENOUS | Status: DC | PRN
  Administered 2012-09-21: 100 mg via INTRAVENOUS

## 2012-09-21 MED ORDER — SIMVASTATIN 10 MG PO TABS
10.0000 mg | ORAL_TABLET | Freq: Every day | ORAL | Status: DC
  Administered 2012-09-21: 10 mg via ORAL
  Filled 2012-09-21 (×2): qty 1

## 2012-09-21 MED ORDER — OXYCODONE HCL 5 MG PO TABS
5.0000 mg | ORAL_TABLET | ORAL | Status: DC | PRN
  Administered 2012-09-22 (×2): 10 mg via ORAL
  Filled 2012-09-21 (×2): qty 2

## 2012-09-21 MED ORDER — ONDANSETRON HCL 4 MG PO TABS: 4.0000 mg | ORAL_TABLET | Freq: Four times a day (QID) | ORAL | Status: DC | PRN

## 2012-09-21 MED ORDER — ONDANSETRON HCL 4 MG/2ML IJ SOLN: 4.0000 mg | Freq: Four times a day (QID) | INTRAMUSCULAR | Status: DC | PRN

## 2012-09-21 MED ORDER — POVIDONE-IODINE 7.5 % EX SOLN
Freq: Once | CUTANEOUS | Status: DC
  Filled 2012-09-21: qty 118

## 2012-09-21 MED ORDER — HYDROMORPHONE HCL PF 1 MG/ML IJ SOLN
0.2500 mg | INTRAMUSCULAR | Status: DC | PRN
  Administered 2012-09-21 (×4): 0.5 mg via INTRAVENOUS

## 2012-09-21 MED ORDER — SALINE SPRAY 0.65 % NA SOLN: 1.0000 | NASAL | Status: DC | PRN

## 2012-09-21 MED ORDER — HYDROMORPHONE HCL PF 1 MG/ML IJ SOLN
INTRAMUSCULAR | Status: AC
  Filled 2012-09-21: qty 1

## 2012-09-21 MED ORDER — LACTATED RINGERS IV SOLN
INTRAVENOUS | Status: DC
  Administered 2012-09-21: 08:00:00 via INTRAVENOUS

## 2012-09-21 MED ORDER — DEXAMETHASONE 6 MG PO TABS
10.0000 mg | ORAL_TABLET | Freq: Three times a day (TID) | ORAL | Status: AC
  Administered 2012-09-21 – 2012-09-22 (×2): 10 mg via ORAL
  Filled 2012-09-21 (×3): qty 1

## 2012-09-21 MED ORDER — POTASSIUM CHLORIDE IN NACL 20-0.9 MEQ/L-% IV SOLN
INTRAVENOUS | Status: DC
  Administered 2012-09-21 – 2012-09-22 (×2): via INTRAVENOUS
  Filled 2012-09-21 (×4): qty 1000

## 2012-09-21 MED ORDER — PROPOFOL 10 MG/ML IV BOLUS
INTRAVENOUS | Status: DC | PRN
  Administered 2012-09-21: 250 mg via INTRAVENOUS
  Administered 2012-09-21: 40 mg via INTRAVENOUS

## 2012-09-21 MED ORDER — DOCUSATE SODIUM 100 MG PO CAPS
100.0000 mg | ORAL_CAPSULE | Freq: Two times a day (BID) | ORAL | Status: DC
  Administered 2012-09-21 – 2012-09-22 (×3): 100 mg via ORAL
  Filled 2012-09-21 (×3): qty 1

## 2012-09-21 MED ORDER — DEXAMETHASONE SODIUM PHOSPHATE 10 MG/ML IJ SOLN
10.0000 mg | Freq: Three times a day (TID) | INTRAMUSCULAR | Status: AC
  Administered 2012-09-21: 10 mg via INTRAVENOUS
  Filled 2012-09-21 (×3): qty 1

## 2012-09-21 MED ORDER — ZOLPIDEM TARTRATE 5 MG PO TABS: 5.0000 mg | ORAL_TABLET | Freq: Every evening | ORAL | Status: DC | PRN

## 2012-09-21 MED ORDER — DIPHENHYDRAMINE HCL 12.5 MG/5ML PO ELIX: 12.5000 mg | ORAL_SOLUTION | ORAL | Status: DC | PRN

## 2012-09-21 MED ORDER — OXYCODONE HCL 5 MG/5ML PO SOLN: 5.0000 mg | Freq: Once | ORAL | Status: DC | PRN

## 2012-09-21 MED ORDER — METOCLOPRAMIDE HCL 10 MG PO TABS: 5.0000 mg | ORAL_TABLET | Freq: Three times a day (TID) | ORAL | Status: DC | PRN

## 2012-09-21 MED ORDER — SODIUM CHLORIDE 0.9 % IR SOLN
Status: DC | PRN
  Administered 2012-09-21: 3000 mL

## 2012-09-21 MED ORDER — LACTATED RINGERS IV SOLN
INTRAVENOUS | Status: DC | PRN
  Administered 2012-09-21 (×2): via INTRAVENOUS

## 2012-09-21 MED ORDER — CEFUROXIME SODIUM 1.5 G IJ SOLR
INTRAMUSCULAR | Status: AC
  Filled 2012-09-21: qty 1.5

## 2012-09-21 SURGICAL SUPPLY — 70 items
BANDAGE ELASTIC 6 VELCRO ST LF (GAUZE/BANDAGES/DRESSINGS) ×1 IMPLANT
BANDAGE ESMARK 6X9 LF (GAUZE/BANDAGES/DRESSINGS) ×1 IMPLANT
BLADE SAGITTAL 25.0X1.19X90 (BLADE) ×2 IMPLANT
BLADE SAW SGTL 11.0X1.19X90.0M (BLADE) IMPLANT
BLADE SAW SGTL 13.0X1.19X90.0M (BLADE) ×2 IMPLANT
BLADE SURG 10 STRL SS (BLADE) ×4 IMPLANT
BNDG CMPR 9X6 STRL LF SNTH (GAUZE/BANDAGES/DRESSINGS) ×1
BNDG CMPR MED 15X6 ELC VLCR LF (GAUZE/BANDAGES/DRESSINGS) ×1
BNDG ELASTIC 6X15 VLCR STRL LF (GAUZE/BANDAGES/DRESSINGS) ×2 IMPLANT
BNDG ESMARK 6X9 LF (GAUZE/BANDAGES/DRESSINGS) ×2
BOWL SMART MIX CTS (DISPOSABLE) ×2 IMPLANT
CEMENT HV SMART SET (Cement) ×4 IMPLANT
CLOTH BEACON ORANGE TIMEOUT ST (SAFETY) ×2 IMPLANT
COVER SURGICAL LIGHT HANDLE (MISCELLANEOUS) ×2 IMPLANT
CUFF TOURNIQUET SINGLE 34IN LL (TOURNIQUET CUFF) ×2 IMPLANT
CUFF TOURNIQUET SINGLE 44IN (TOURNIQUET CUFF) IMPLANT
DRAPE EXTREMITY T 121X128X90 (DRAPE) ×2 IMPLANT
DRAPE INCISE IOBAN 66X45 STRL (DRAPES) ×2 IMPLANT
DRAPE PROXIMA HALF (DRAPES) ×2 IMPLANT
DRAPE U-SHAPE 47X51 STRL (DRAPES) ×2 IMPLANT
DRSG ADAPTIC 3X8 NADH LF (GAUZE/BANDAGES/DRESSINGS) ×2 IMPLANT
DRSG PAD ABDOMINAL 8X10 ST (GAUZE/BANDAGES/DRESSINGS) ×3 IMPLANT
DURAPREP 26ML APPLICATOR (WOUND CARE) ×2 IMPLANT
ELECT CAUTERY BLADE 6.4 (BLADE) ×2 IMPLANT
ELECT REM PT RETURN 9FT ADLT (ELECTROSURGICAL) ×2
ELECTRODE REM PT RTRN 9FT ADLT (ELECTROSURGICAL) ×1 IMPLANT
EVACUATOR 1/8 PVC DRAIN (DRAIN) ×1 IMPLANT
FACESHIELD LNG OPTICON STERILE (SAFETY) ×2 IMPLANT
GLOVE BIO SURGEON STRL SZ7 (GLOVE) ×2 IMPLANT
GLOVE BIOGEL PI IND STRL 7.0 (GLOVE) ×1 IMPLANT
GLOVE BIOGEL PI IND STRL 7.5 (GLOVE) ×1 IMPLANT
GLOVE BIOGEL PI INDICATOR 7.0 (GLOVE) ×1
GLOVE BIOGEL PI INDICATOR 7.5 (GLOVE) ×1
GLOVE ECLIPSE 7.5 STRL STRAW (GLOVE) ×1 IMPLANT
GLOVE SS BIOGEL STRL SZ 7.5 (GLOVE) ×1 IMPLANT
GLOVE SUPERSENSE BIOGEL SZ 7.5 (GLOVE) ×2
GOWN PREVENTION PLUS XLARGE (GOWN DISPOSABLE) ×4 IMPLANT
GOWN STRL NON-REIN LRG LVL3 (GOWN DISPOSABLE) ×4 IMPLANT
HANDPIECE INTERPULSE COAX TIP (DISPOSABLE) ×2
HOOD PEEL AWAY FACE SHEILD DIS (HOOD) ×4 IMPLANT
IMMOBILIZER KNEE 22 UNIV (SOFTGOODS) ×1 IMPLANT
INSERT CUSHION PRONEVIEW LG (MISCELLANEOUS) ×2 IMPLANT
KIT BASIN OR (CUSTOM PROCEDURE TRAY) ×2 IMPLANT
KIT ROOM TURNOVER OR (KITS) ×2 IMPLANT
MANIFOLD NEPTUNE II (INSTRUMENTS) ×2 IMPLANT
NS IRRIG 1000ML POUR BTL (IV SOLUTION) ×2 IMPLANT
PACK TOTAL JOINT (CUSTOM PROCEDURE TRAY) ×2 IMPLANT
PAD ARMBOARD 7.5X6 YLW CONV (MISCELLANEOUS) ×4 IMPLANT
PAD CAST 4YDX4 CTTN HI CHSV (CAST SUPPLIES) ×1 IMPLANT
PADDING CAST COTTON 4X4 STRL (CAST SUPPLIES) ×2
PADDING CAST COTTON 6X4 STRL (CAST SUPPLIES) ×2 IMPLANT
POSITIONER HEAD PRONE TRACH (MISCELLANEOUS) ×2 IMPLANT
RUBBERBAND STERILE (MISCELLANEOUS) ×2 IMPLANT
SET HNDPC FAN SPRY TIP SCT (DISPOSABLE) ×1 IMPLANT
SPONGE GAUZE 4X4 12PLY (GAUZE/BANDAGES/DRESSINGS) ×2 IMPLANT
STRIP CLOSURE SKIN 1/2X4 (GAUZE/BANDAGES/DRESSINGS) ×2 IMPLANT
SUCTION FRAZIER TIP 10 FR DISP (SUCTIONS) ×2 IMPLANT
SUT ETHIBOND NAB CT1 #1 30IN (SUTURE) ×4 IMPLANT
SUT MNCRL AB 3-0 PS2 18 (SUTURE) ×2 IMPLANT
SUT VIC AB 0 CT1 27 (SUTURE) ×6
SUT VIC AB 0 CT1 27XBRD ANBCTR (SUTURE) ×2 IMPLANT
SUT VIC AB 2-0 CT1 27 (SUTURE) ×4
SUT VIC AB 2-0 CT1 TAPERPNT 27 (SUTURE) ×2 IMPLANT
SYR 30ML SLIP (SYRINGE) ×2 IMPLANT
TOWEL OR 17X24 6PK STRL BLUE (TOWEL DISPOSABLE) ×2 IMPLANT
TOWEL OR 17X26 10 PK STRL BLUE (TOWEL DISPOSABLE) ×2 IMPLANT
TRAY FOLEY CATH 14FR (SET/KITS/TRAYS/PACK) ×2 IMPLANT
TUBE CONNECTING 12X1/4 (SUCTIONS) ×1 IMPLANT
WATER STERILE IRR 1000ML POUR (IV SOLUTION) ×4 IMPLANT
YANKAUER SUCT BULB TIP NO VENT (SUCTIONS) ×1 IMPLANT

## 2012-09-21 NOTE — Progress Notes (Signed)
Orthopedic Tech Progress Note Patient Details:  Joel [facility] Oct 20, 1950 [id number]  CPM Left Knee CPM Left Knee: On Left Knee Flexion (Degrees): 60 Left Knee Extension (Degrees): 0 Additional Comments: trapeze bar   Joel Ford 09/21/2012, 12:20 PM

## 2012-09-21 NOTE — Transfer of Care (Signed)
Immediate Anesthesia Transfer of Care Note  Patient: Joel Ford  Procedure(s) Performed: Procedure(s): TOTAL KNEE ARTHROPLASTY (Left)  Patient Location: PACU  Anesthesia Type:General and Regional  Level of Consciousness: awake, alert , oriented and sedated  Airway & Oxygen Therapy: Patient Spontanous Breathing and Patient connected to nasal cannula oxygen  Post-op Assessment: Report given to PACU RN, Post -op Vital signs reviewed and stable and Patient moving all extremities  Post vital signs: Reviewed and stable  Complications: No apparent anesthesia complications

## 2012-09-21 NOTE — Interval H&P Note (Signed)
History and Physical Interval Note:  09/21/2012 8:57 AM  Joel Ford  has presented today for surgery, with the diagnosis of DJD LEFT KNEE  The various methods of treatment have been discussed with the patient and family. After consideration of risks, benefits and other options for treatment, the patient has consented to  Procedure(s): TOTAL KNEE ARTHROPLASTY (Left) as a surgical intervention .  The patient's history has been reviewed, patient examined, no change in status, stable for surgery.  I have reviewed the patient's chart and labs.  Questions were answered to the patient's satisfaction.     Salvatore Marvel A

## 2012-09-21 NOTE — Anesthesia Postprocedure Evaluation (Signed)
Anesthesia Post Note  Patient: Joel Ford  Procedure(s) Performed: Procedure(s) (LRB): TOTAL KNEE ARTHROPLASTY (Left)  Anesthesia type: General  Patient location: PACU  Post pain: Pain level controlled and Adequate analgesia  Post assessment: Post-op Vital signs reviewed, Patient's Cardiovascular Status Stable, Respiratory Function Stable, Patent Airway and Pain level controlled  Last Vitals:  Filed Vitals:   09/21/12 1123  BP:   Pulse:   Temp: 36.4 C  Resp:     Post vital signs: Reviewed and stable  Level of consciousness: awake, alert  and oriented  Complications: No apparent anesthesia complications

## 2012-09-21 NOTE — Evaluation (Signed)
Physical Therapy Evaluation Patient Details Name: Joel Ford MRN: [id number] DOB: October 05, 1950 Today's Date: 09/21/2012 Time: 4098-1191 PT Time Calculation (min): 27 min  PT Assessment / Plan / Recommendation Clinical Impression  Patient is a 62 yo male s/p Lt. TKA.  Did well for first time up.  Will benefit from acute PT to maximize independence prior to discharge home with wife.  Recommend HHPT at discharge.    PT Assessment  Patient needs continued PT services    Follow Up Recommendations  Home health PT;Supervision/Assistance - 24 hour    Does the patient have the potential to tolerate intense rehabilitation      Barriers to Discharge None      Equipment Recommendations  None recommended by PT    Recommendations for Other Services     Frequency 7X/week    Precautions / Restrictions Precautions Precautions: Knee Precaution Booklet Issued: Yes (comment) Precaution Comments: Reviewed precautions with patient and wife. Required Braces or Orthoses: Knee Immobilizer - Left Knee Immobilizer - Left: On except when in CPM;Discontinue once straight leg raise with < 10 degree lag Restrictions Weight Bearing Restrictions: Yes LLE Weight Bearing: Weight bearing as tolerated   Pertinent Vitals/Pain       Mobility  Bed Mobility Bed Mobility: Supine to Sit;Sitting - Scoot to Edge of Bed Supine to Sit: 4: Min assist;HOB flat Sitting - Scoot to [place] of Bed: 4: Min guard Details for Bed Mobility Assistance: Instructed patient and wife on donning KI.  Verbal cues for technique.  Min assist to move LLE off of bed.  Able to sit EOB with good balance. Transfers Transfers: Sit to Stand;Stand to [employer] Transfers Sit to Stand: 4: Min assist;With upper extremity assist;From bed Stand to Sit: 4: Min assist;With upper extremity assist;With armrests;To chair/3-in-1 Stand Pivot Transfers: 4: Min assist;With armrests Details for Transfer Assistance: Verbal cues for hand placement  and placement of LLE during transfers.  Assist to rise to standing and to control descent to chair.  Verbal cues for safe use of RW and technique for pivot transfer. Ambulation/Gait Ambulation/Gait Assistance: Not tested (comment)    Exercises Total Joint Exercises Ankle Circles/Pumps: AROM;Both;10 reps;Seated   PT Diagnosis: Difficulty walking;Acute pain  PT Problem List: Decreased strength;Decreased range of motion;Decreased activity tolerance;Decreased mobility;Decreased knowledge of use of DME;Decreased knowledge of precautions;Pain PT Treatment Interventions: DME instruction;Gait training;Stair training;Functional mobility training;Therapeutic exercise;Patient/family education   PT Goals Acute Rehab PT Goals PT Goal Formulation: With patient/family Time For Goal Achievement: 09/28/12 Potential to Achieve Goals: Good Pt will go Supine/Side to Sit: Independently;with HOB 0 degrees PT Goal: Supine/Side to Sit - Progress: Goal set today Pt will go Sit to Supine/Side: Independently;with HOB 0 degrees PT Goal: Sit to Supine/Side - Progress: Goal set today Pt will go Sit to Stand: with supervision;with upper extremity assist PT Goal: Sit to Stand - Progress: Goal set today Pt will Ambulate: >150 feet;with supervision;with rolling walker PT Goal: Ambulate - Progress: Goal set today Pt will Go Up / Down Stairs: 3-5 stairs;with supervision;with rolling walker PT Goal: Up/Down Stairs - Progress: Goal set today Pt will Perform Home Exercise Program: Independently PT Goal: Perform Home Exercise Program - Progress: Goal set today  Visit Information  Last PT Received On: 09/21/12 Assistance Needed: +1    Subjective Data  Subjective: "I'm a fireman, so I move alot at work" Patient Stated Goal: To go home soon.   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) Communication Communication: No difficulties    Cognition  Cognition Overall Cognitive Status: Appears within functional limits for tasks assessed/performed Arousal/Alertness: Awake/alert Orientation Level: Oriented X4 / Intact Behavior During Session: [place] for tasks performed    Extremity/Trunk Assessment Right Upper Extremity Assessment RUE ROM/Strength/Tone: Within functional levels RUE Sensation: WFL - Light Touch Left Upper Extremity Assessment LUE ROM/Strength/Tone: Within functional levels LUE Sensation: WFL - Light Touch Right Lower Extremity Assessment RLE ROM/Strength/Tone: Within functional levels RLE Sensation: WFL - Light Touch Left Lower Extremity Assessment LLE ROM/Strength/Tone: Deficits;Unable to fully assess;Due to pain LLE ROM/Strength/Tone Deficits: Able to assist moving LLE off of bed. Trunk Assessment Trunk Assessment: Normal   Balance Balance Balance Assessed: Yes Static Sitting Balance Static Sitting - Balance Support: No upper extremity supported;Feet supported Static Sitting - Level of Assistance: 5: Stand by assistance Static Sitting - Comment/# of Minutes: 6  End of Session PT - End of Session Equipment Utilized During Treatment: Gait belt;Left knee immobilizer Activity Tolerance: Patient tolerated treatment well Patient left: in chair;with call bell/phone within reach;with family/visitor present Nurse Communication: Mobility status CPM Left Knee CPM Left Knee: Off  GP     Vena Austria 09/21/2012, 4:42 PM Durenda Hurt. Renaldo Fiddler, [facility] Acute Rehab  Services Pager [phone]

## 2012-09-21 NOTE — H&P (View-Only) (Signed)
TOTAL KNEE ADMISSION H&P  Patient is being admitted for left total knee arthroplasty.  Subjective:  Chief Complaint:left knee pain.  HPI: Joel Ford, 62 y.o. male, has a history of pain and functional disability in the left knee due to arthritis and has failed non-surgical conservative treatments for greater than 12 weeks to includeNSAID's and/or analgesics, corticosteriod injections, flexibility and strengthening excercises, weight reduction as appropriate and activity modification.  Onset of symptoms was gradual, starting >10 years ago with gradually worsening course since that time. The patient noted prior procedures on the knee to include  arthroscopy and menisectomy on the left knee(s).  Patient currently rates pain in the left knee(s) at 9 out of 10 with activity. Patient has night pain, worsening of pain with activity and weight bearing, pain that interferes with activities of daily living, crepitus and joint swelling.  Patient has evidence of subchondral sclerosis, periarticular osteophytes and joint space narrowing by imaging studies. There is no active infection.  Patient Active Problem List   Diagnosis Date Noted  . Gout   . Left knee DJD   . Preop cardiovascular exam 09/06/2012  . Arthritis of knee, degenerative 08/30/2012  . EXTERNAL HEMORRHOIDS 12/26/2009  . S/P total knee replacement 08/07/2009  . HYPERGLYCEMIA 07/27/2009  . HYPERLIPIDEMIA 07/04/2009  . ABNORMAL ELECTROCARDIOGRAM 07/04/2009  . CHEST WALL PAIN, ACUTE 08/30/2008  . RENAL CALCULUS 02/15/2008  . BACK PAIN, LUMBAR 02/15/2008  . GOUT 01/22/2008   Past Medical History  Diagnosis Date  . Asthma   . Gout   . HYPERLIPIDEMIA 07/04/2009    Qualifier: Diagnosis of  By: Jillyn Hidden FNP, Mcarthur Rossetti   . ABNORMAL ELECTROCARDIOGRAM 07/04/2009    Qualifier: Diagnosis of  By: Jillyn Hidden FNP, Mcarthur Rossetti   . HYPERGLYCEMIA 07/27/2009    Qualifier: Diagnosis of  By: Hetty Ely MD, Franne Grip   . RENAL CALCULUS  02/15/2008    Qualifier: Diagnosis of  By: Jillyn Hidden FNP, Mcarthur Rossetti   . Left knee DJD   . S/P total knee replacement 08/07/2009    right    Past Surgical History  Procedure Laterality Date  . Appendectomy      age 56  . Orthoscopy      Right and left knee  . Total knee arthroplasty  2011    Right  . Cardiac catheterization  01/23/2005    Normal     (Not in a hospital admission) No Known Allergies   Current Outpatient Prescriptions on File Prior to Visit  Medication Sig Dispense Refill  . lovastatin (MEVACOR) 20 MG tablet Take 20 mg by mouth at bedtime.      . sodium chloride (OCEAN) 0.65 % SOLN nasal spray Place 1 spray into the nose as needed for congestion.      . colchicine 0.6 MG tablet Take 0.6 mg by mouth daily as needed (for gout flares.).       No current facility-administered medications on file prior to visit.     History  Substance Use Topics  . Smoking status: Never Smoker   . Smokeless tobacco: Current Ishi Danser    Types: Chew  . Alcohol Use: No    Family History  Problem Relation Age of Onset  . Stroke Father      Review of Systems  Constitutional: Negative.   HENT: Negative.   Respiratory: Negative.   Cardiovascular: Negative.   Gastrointestinal: Negative.   Genitourinary: Negative.   Musculoskeletal: Positive for joint pain.       Left knee pain  Skin: Negative.   Neurological: Negative.   Endo/Heme/Allergies: Negative.   Psychiatric/Behavioral: Negative.     Objective:  Physical Exam  Constitutional: He is oriented to person, place, and time. He appears well-developed and well-nourished.  HENT:  Head: Normocephalic and atraumatic.  Eyes: EOM are normal. Pupils are equal, round, and reactive to light.  Neck: Neck supple.  Cardiovascular: Normal rate and regular rhythm.  Exam reveals no gallop and no friction rub.   No murmur heard. Respiratory: Effort normal.  GI: Soft.  Musculoskeletal:  He is independently ambulatory with a  moderately antalgic gait and varus deformity in his left knee.  Right knee has no deformity.  Left knee has active range of motion 0-120 degrees.  Varus deformity.  2+ crepitus.  Periarticular osteophytes.  Distal neurovascular exam is intact.  Right knee has well healed total knee incision.  Active range of motion 0-120 degrees.  No pain, swelling or deformity.  He has 2+ dorsalis pedis pulses bilaterally.    Neurological: He is alert and oriented to person, place, and time.  Skin: Skin is warm and dry.  Psychiatric: He has a normal mood and affect.    Vital signs in last 24 hours: Last recorded: 03/05 1300   BP: 162/89 Pulse: 96  Temp: 97.7 F (36.5 C)    Height: 5\' 10"  (1.778 m) SpO2: 96  Weight: 108.863 kg (240 lb)     Labs:   Estimated body mass index is 34.44 kg/(m^2) as calculated from the following:   Height as of this encounter: 5\' 10"  (1.778 m).   Weight as of this encounter: 108.863 kg (240 lb).   Imaging Review Plain radiographs demonstrate severe degenerative joint disease of the left knee(s). The overall alignment issignificant varus. The bone quality appears to be good for age and reported activity level.  Assessment/Plan:  End stage arthritis, left knee  Patient Active Problem List  Diagnosis  . HYPERLIPIDEMIA  . GOUT  . EXTERNAL HEMORRHOIDS  . RENAL CALCULUS  . BACK PAIN, LUMBAR  . CHEST WALL PAIN, ACUTE  . HYPERGLYCEMIA  . ABNORMAL ELECTROCARDIOGRAM  . Arthritis of knee, degenerative  . Preop cardiovascular exam  . Gout  . Left knee DJD  . S/P total knee replacement    The patient history, physical examination, clinical judgment of the provider and imaging studies are consistent with end stage degenerative joint disease of the left knee(s) and total knee arthroplasty is deemed medically necessary. The treatment options including medical management, injection therapy arthroscopy and arthroplasty were discussed at length. The risks and benefits of total  knee arthroplasty were presented and reviewed. The risks due to aseptic loosening, infection, stiffness, patella tracking problems, thromboembolic complications and other imponderables were discussed. The patient acknowledged the explanation, agreed to proceed with the plan and consent was signed. Patient is being admitted for inpatient treatment for surgery, pain control, PT, OT, prophylactic antibiotics, VTE prophylaxis, progressive ambulation and ADL's and discharge planning. The patient is planning to be discharged home with home health services   Kirstin A. Gwinda Passe Physician Assistant [facility] [phone]  09/09/2012, 1:38 PM

## 2012-09-21 NOTE — Plan of Care (Signed)
Problem: Consults Goal: Diagnosis- Total Joint Replacement Primary Total Knee Left     

## 2012-09-21 NOTE — Anesthesia Preprocedure Evaluation (Signed)
Anesthesia Evaluation  Patient identified by MRN, date of birth, ID band Patient awake    Reviewed: Allergy & Precautions, H&P , NPO status , Patient's Chart, lab work & pertinent test results  Airway Mallampati: II  Neck ROM: full    Dental   Pulmonary          Cardiovascular + Peripheral Vascular Disease     Neuro/Psych    GI/Hepatic   Endo/Other  obese  Renal/GU      Musculoskeletal   Abdominal   Peds  Hematology   Anesthesia Other Findings   Reproductive/Obstetrics                           Anesthesia Physical Anesthesia Plan  ASA: II  Anesthesia Plan: General and Regional   Post-op Pain Management: MAC Combined w/ Regional for Post-op pain   Induction: Intravenous  Airway Management Planned: LMA  Additional Equipment:   Intra-op Plan:   Post-operative Plan:   Informed Consent: I have reviewed the patients History and Physical, chart, labs and discussed the procedure including the risks, benefits and alternatives for the proposed anesthesia with the patient or authorized representative who has indicated his/her understanding and acceptance.     Plan Discussed with: CRNA and Surgeon  Anesthesia Plan Comments:         Anesthesia Quick Evaluation

## 2012-09-21 NOTE — Progress Notes (Signed)
UR COMPLETED  

## 2012-09-21 NOTE — Anesthesia Procedure Notes (Signed)
Anesthesia Regional Block:  Femoral nerve block  Pre-Anesthetic Checklist: ,, timeout performed, Correct Patient, Correct Site, Correct Laterality, Correct Procedure,, site marked, risks and benefits discussed, Surgical consent,  Pre-op evaluation,  At surgeon's request and post-op pain management  Laterality: Left  Prep: chloraprep       Needles:  Injection technique: Single-shot  Needle Type: Other     Needle Length: 5cm 5 cm Needle Gauge: 20 and 20 G    Additional Needles:  Procedures: ultrasound guided (picture in chart) and nerve stimulator Femoral nerve block Narrative:  Start time: 09/21/2012 8:20 AM End time: 09/21/2012 8:29 AM Injection made incrementally with aspirations every 5 mL.  Performed by: Personally   Additional Notes: Monitors applied. Patient sedated. Sterile prep and drape,hand hygiene and sterile gloves were used. Relevant anatomy identified.Needle position confirmed.Local anesthetic injected incrementally after negative aspiration. Local anesthetic spread visualized around nerve(s). Vascular puncture avoided. No complications. Image printed for medical record.The patient tolerated the procedure well.         Femoral nerve block

## 2012-09-21 NOTE — Preoperative (Signed)
Beta Blockers   Reason not to administer Beta Blockers:Not Applicable 

## 2012-09-21 NOTE — Op Note (Signed)
MRN:     [id number] DOB/AGE:    62-Apr-1952 / 62 y.o.       OPERATIVE REPORT    DATE OF PROCEDURE:  09/21/2012       PREOPERATIVE DIAGNOSIS:   DJD LEFT KNEE      Estimated body mass index is 35.37 kg/(m^2) as calculated from the following:   Height as of 09/14/12: 5\' 11"  (1.803 m).   Weight as of 02/11/11: 114.987 kg (253 lb 8 oz).                                                        POSTOPERATIVE DIAGNOSIS:   DJD LEFT KNEE                                                                      PROCEDURE:  Procedure(s): TOTAL KNEE ARTHROPLASTY Using Depuy Sigma RP implants #5 Femur, #5Tibia, 12.69mm sigma RP bearing, 35 Patella     SURGEON: Donaldson Richter A    ASSISTANT:  Kirstin Shepperson PA-C   (Present and scrubbed throughout the case, critical for assistance with exposure, retraction, instrumentation, and closure.)         ANESTHESIA: GET with Femoral Nerve Block  DRAINS: foley, 2 medium hemovac in knee   TOURNIQUET TIME:   COMPLICATIONS:  None     SPECIMENS: None   INDICATIONS FOR PROCEDURE: The patient has  DJD LEFT KNEE, varus deformities, XR shows bone on bone arthritis. Patient has failed all conservative measures including anti-inflammatory medicines, narcotics, attempts at  exercise and weight loss, cortisone injections and viscosupplementation.  Risks and benefits of surgery have been discussed, questions answered.   DESCRIPTION OF PROCEDURE: The patient identified by armband, received  right femoral nerve block and IV antibiotics, in the holding area at [facility]. Patient taken to the operating room, appropriate anesthetic  monitors were attached General endotracheal anesthesia induced with  the patient in supine position, Foley catheter was inserted. Tourniquet  applied high to the operative thigh. Lateral post and foot positioner  applied to the table, the lower extremity was then prepped and draped  in usual sterile fashion from the ankle to the  tourniquet. Time-out procedure was performed. The limb was wrapped with an Esmarch bandage and the tourniquet inflated to 365 mmHg. We began the operation by making the anterior midline incision starting at handbreadth above the patella going over the patella 1 cm medial to and  4 cm distal to the tibial tubercle. Small bleeders in the skin and the  subcutaneous tissue identified and cauterized. Transverse retinaculum was incised and reflected medially and a medial parapatellar arthrotomy was accomplished. the patella was everted and theprepatellar fat pad resected. The superficial medial collateral  ligament was then elevated from anterior to posterior along the proximal  flare of the tibia and anterior half of the menisci resected. The knee was hyperflexed exposing bone on bone arthritis. Peripheral and notch osteophytes as well as the cruciate ligaments were then resected. We continued to  work our way around posteriorly along the proximal tibia, and externally  rotated the tibia subluxing it out from underneath the femur. A McHale  retractor was placed through the notch and a lateral Hohmann retractor  placed, and we then drilled through the proximal tibia in line with the  axis of the tibia followed by an intramedullary guide rod and 2-degree  posterior slope cutting guide. The tibial cutting guide was pinned into place  allowing resection of 4 mm of bone medially and about 6 mm of bone  laterally because of her varus deformity. Satisfied with the tibial resection, we then  entered the distal femur 2 mm anterior to the PCL origin with the  intramedullary guide rod and applied the distal femoral cutting guide  set at 11mm, with 5 degrees of valgus. This was pinned along the  epicondylar axis. At this point, the distal femoral cut was accomplished without difficulty. We then sized for a #5 femoral component and pinned the guide in 3 degrees of external rotation.The chamfer cutting guide was pinned  into place. The anterior, posterior, and chamfer cuts were accomplished without difficulty followed by  the Sigma RP box cutting guide and the box cut. We also removed posterior osteophytes from the posterior femoral condyles. At this  time, the knee was brought into full extension. We checked our  extension and flexion gaps and found them symmetric at 10mm.  The patella thickness measured at 25 mm. We set the cutting guide at 15 and removed the posterior 9.5-10 mm  of the patella sized for 35 button and drilled the lollipop. The knee  was then once again hyperflexed exposing the proximal tibia. We sized for a #5 tibial base plate, applied the smokestack and the conical reamer followed by the the Delta fin keel punch. We then hammered into place the Sigma RP trial femoral component, inserted a 12.5-mm trial bearing, trial patellar button, and took the knee through range of motion from 0-130 degrees. No thumb pressure was required for patellar  tracking. At this point, all trial components were removed, a double batch of DePuy HV cement with 1500 mg of Zinacef was mixed and applied to all bony metallic mating surfaces except for the posterior condyles of the femur itself. In order, we  hammered into place the tibial tray and removed excess cement, the femoral component and removed excess cement, a 12.5-mm Sigma RP bearing  was inserted, and the knee brought to full extension with compression.  The patellar button was clamped into place, and excess cement  removed. While the cement cured the wound was irrigated out with normal saline solution pulse lavage, and medium Hemovac drains were placed.. Ligament stability and patellar tracking were checked and found to be excellent. The tourniquet was then released and hemostasis was obtained with cautery. The parapatellar arthrotomy was closed with  #1 ethibond suture. The subcutaneous tissue with 0 and 2-0 undyed  Vicryl suture, and 4-0 Monocryl.. A dressing of  Xeroform,  4 x 4, dressing sponges, Webril, and Ace wrap applied. Needle and sponge count were correct times 2.The patient awakened, extubated, and taken to recovery room without difficulty. Vascular status was normal, pulses 2+ and symmetric.   Dennison Mcdaid A 09/21/2012, 10:48 AM

## 2012-09-22 ENCOUNTER — Encounter (HOSPITAL_COMMUNITY): Payer: Self-pay | Admitting: General Practice

## 2012-09-22 LAB — CBC
HCT: 33.5 % — ABNORMAL LOW (ref 39.0–52.0)
Hemoglobin: 10.2 g/dL — ABNORMAL LOW (ref 13.0–17.0)
MCH: 22.4 pg — ABNORMAL LOW (ref 26.0–34.0)
MCV: 73.5 fL — ABNORMAL LOW (ref 78.0–100.0)
RBC: 4.56 MIL/uL (ref 4.22–5.81)

## 2012-09-22 LAB — BASIC METABOLIC PANEL
BUN: 12 mg/dL (ref 6–23)
CO2: 24 mEq/L (ref 19–32)
Calcium: 8.2 mg/dL — ABNORMAL LOW (ref 8.4–10.5)
Creatinine, Ser: 0.89 mg/dL (ref 0.50–1.35)
Glucose, Bld: 141 mg/dL — ABNORMAL HIGH (ref 70–99)
Sodium: 138 mEq/L (ref 135–145)

## 2012-09-22 MED ORDER — ACETAMINOPHEN 325 MG PO TABS: 650.0000 mg | ORAL_TABLET | Freq: Four times a day (QID) | ORAL | Status: DC | PRN

## 2012-09-22 MED ORDER — BISACODYL 5 MG PO TBEC: DELAYED_RELEASE_TABLET | ORAL | Status: DC

## 2012-09-22 MED ORDER — ASPIRIN EC 325 MG PO TBEC: DELAYED_RELEASE_TABLET | ORAL | Status: DC

## 2012-09-22 MED ORDER — CELECOXIB 200 MG PO CAPS: 200.0000 mg | ORAL_CAPSULE | Freq: Every day | ORAL | Status: DC

## 2012-09-22 MED ORDER — DSS 100 MG PO CAPS: ORAL_CAPSULE | ORAL | Status: DC

## 2012-09-22 MED ORDER — OXYCODONE HCL 5 MG PO TABS: ORAL_TABLET | ORAL | Status: DC

## 2012-09-22 NOTE — Care Management Note (Signed)
CARE MANAGEMENT NOTE 09/22/2012  Patient:  Joel Ford, Joel Ford   Account Number:  [id number]  Date Initiated:  09/22/2012  Documentation initiated by:  Vance Peper  Subjective/Objective Assessment:   62 yr old male s/p left total knee arthroplasty.     Action/Plan:   CM spoke with patient and wife concerning home health and DME needs. Choice was offered. Patient was preoperatively setup with [facility], no changes. Pt. has rolling walker and 3in1, CPM has been delivered.Has family support at discharge.   Anticipated DC Date:  09/22/2012   Anticipated DC Plan:  HOME W HOME HEALTH SERVICES      DC Planning Services  CM consult      [facility] Choice  HOME HEALTH   Choice offered to / List presented to:  C-1 Patient        HH arranged  HH-2 PT      Status of service:  Completed, signed off Medicare Important Message given?   (If response is "NO", the following Medicare IM given date fields will be blank) Date Medicare IM given:   Date Additional Medicare IM given:    Discharge Disposition:  HOME W HOME HEALTH SERVICES  Per UR Regulation:    If discussed at Long Length of Stay Meetings, dates discussed:    Comments:

## 2012-09-22 NOTE — Discharge Summary (Signed)
Patient ID: Joel Ford MRN: [id number] DOB/AGE: 14-Jun-1951 62 y.o.  Admit date: 09/21/2012 Discharge date: 09/22/2012  Admission Diagnoses:  Principal Problem:   Left knee DJD Active Problems:   HYPERLIPIDEMIA   GOUT   EXTERNAL HEMORRHOIDS   RENAL CALCULUS   BACK PAIN, LUMBAR   HYPERGLYCEMIA   ABNORMAL ELECTROCARDIOGRAM   S/P total knee replacement   Discharge Diagnoses:  Same  Past Medical History  Diagnosis Date  . Gout   . HYPERLIPIDEMIA 07/04/2009    Qualifier: Diagnosis of  By: Jillyn Hidden FNP, Mcarthur Rossetti   . ABNORMAL ELECTROCARDIOGRAM 07/04/2009    Qualifier: Diagnosis of  By: Jillyn Hidden FNP, Mcarthur Rossetti   . HYPERGLYCEMIA 07/27/2009    Qualifier: Diagnosis of  By: Hetty Ely MD, Franne Grip   . RENAL CALCULUS 02/15/2008    Qualifier: Diagnosis of  By: Jillyn Hidden FNP, Mcarthur Rossetti   . S/P total knee replacement 08/07/2009    right  . Left knee DJD     OA    Surgeries: Procedure(s): TOTAL KNEE ARTHROPLASTY on 09/21/2012   Consultants:    Discharged Condition: Improved  Hospital Course: Decklyn Hyder is an 62 y.o. male who was admitted 09/21/2012 for operative treatment ofLeft knee DJD. Patient has severe unremitting pain that affects sleep, daily activities, and work/hobbies. After pre-op clearance the patient was taken to the operating room on 09/21/2012 and underwent  Procedure(s): TOTAL KNEE ARTHROPLASTY.    Patient was given perioperative antibiotics: Anti-infectives   Start     Dose/Rate Route Frequency Ordered Stop   09/21/12 1500  ceFAZolin (ANCEF) IVPB 2 g/50 mL premix     2 g 100 mL/hr over 30 Minutes Intravenous Every 6 hours 09/21/12 1339 09/21/12 2139   09/21/12 0850  cefUROXime (ZINACEF) injection  Status:  Discontinued       As needed 09/21/12 0850 09/21/12 1119   09/21/12 0600  ceFAZolin (ANCEF) 3 g in dextrose 5 % 50 mL IVPB     3 g 160 mL/hr over 30 Minutes Intravenous On call to O.R. 09/20/12 1304 09/21/12 0900       Patient was  given sequential compression devices, early ambulation, and chemoprophylaxis to prevent DVT.  Patient benefited maximally from hospital stay and there were no complications.    Recent vital signs: Patient Vitals for the past 24 hrs:  BP Temp Temp src Pulse Resp SpO2  09/22/12 0800 - - - - 18 -  09/22/12 0559 137/79 mmHg 98.7 F (37.1 C) Oral 86 18 97 %  09/22/12 0100 124/69 mmHg 98.6 F (37 C) Oral 80 18 97 %  09/21/12 2148 127/78 mmHg 98.2 F (36.8 C) Oral 84 18 96 %  09/21/12 1331 144/89 mmHg 98.3 F (36.8 C) - 78 16 95 %  09/21/12 1308 156/96 mmHg - - 73 12 99 %  09/21/12 1300 - 97.9 F (36.6 C) - 76 9 100 %  09/21/12 1253 151/89 mmHg - - 78 10 99 %  09/21/12 1245 - - - 77 13 99 %  09/21/12 1239 142/78 mmHg - - 85 12 98 %  09/21/12 1230 - - - 78 10 98 %  09/21/12 1224 163/97 mmHg - - 84 13 98 %  09/21/12 1215 - - - 90 7 95 %  09/21/12 1208 172/97 mmHg - - 94 15 92 %  09/21/12 1200 - - - 87 10 95 %  09/21/12 1153 160/98 mmHg - - 97 14 95 %  09/21/12 1145 - - - 95 10  95 %  09/21/12 1130 - - - 106 11 94 %  09/21/12 1123 - 97.5 F (36.4 C) - - - -     Recent laboratory studies:  Recent Labs  09/21/12 1213 09/21/12 1704 09/22/12 0550  WBC 7.2  --  11.3*  HGB 11.7*  --  10.2*  HCT 36.4*  --  33.5*  PLT 148*  --  178  NA  --   --  138  K  --   --  4.3  CL  --   --  104  CO2  --   --  24  BUN  --   --  12  CREATININE  --  0.92 0.89  GLUCOSE  --   --  141*  CALCIUM  --   --  8.2*     Discharge Medications:     Medication List    STOP taking these medications       ibuprofen 200 MG tablet  Commonly known as:  ADVIL,MOTRIN      TAKE these medications       acetaminophen 325 MG tablet  Commonly known as:  TYLENOL  Take 2 tablets (650 mg total) by mouth every 6 (six) hours as needed for pain or fever.     aspirin EC 325 MG tablet  1 pill 2 times a day for 14 days to prevent blood clots     bisacodyl 5 MG EC tablet  Commonly known as:  DULCOLAX  Take 2  tablets every night with dinner until bowel movement.  LAXITIVE.  Restart if two days since last bowel movement     celecoxib 200 MG capsule  Commonly known as:  CELEBREX  Take 1 capsule (200 mg total) by mouth daily.     colchicine 0.6 MG tablet  Take 0.6 mg by mouth daily as needed (for gout flares.).     DSS 100 MG Caps  1 tab 2 times a day while on narcotics.  STOOL SOFTENER     lovastatin 20 MG tablet  Commonly known as:  MEVACOR  Take 1 tablet (20 mg total) by mouth at bedtime.     oxyCODONE 5 MG immediate release tablet  Commonly known as:  Oxy IR/ROXICODONE  1-2 tablets every 4-6 hrs as needed for pain     sodium chloride 0.65 % Soln nasal spray  Commonly known as:  OCEAN  Place 1 spray into the nose as needed for congestion.        Diagnostic Studies: Dg Chest 2 View  09/14/2012  *RADIOLOGY REPORT*  Clinical Data: Preoperative radiograph.  Left total knee arthroplasty.  CHEST - 2 VIEW  Comparison: 08/01/2009.  Findings: Scarring is present along the apex of the heart.  There is no airspace disease.  No effusion.  Cardiopericardial silhouette appears within normal limits.  Thoracic spine DISH.  The prominent extrapleural fat along the right chest wall.  IMPRESSION: No active cardiopulmonary disease.  Chronic scarring along the cardiac apex.   Original Report Authenticated By: Andreas Newport, M.D.     Disposition:       Discharge Orders   Future Orders Complete By Expires     CPM  As directed     Comments:      Continuous passive motion machine (CPM):      Use the CPM from 0 to 90 for 6 hours per day.       You may break it up into 2 or 3 sessions per day.  Use CPM for 2 weeks or until you are told to stop.    Call MD / Call 911  As directed     Comments:      If you experience chest pain or shortness of breath, CALL 911 and be transported to the hospital emergency room.  If you develope a fever above 101 F, pus (white drainage) or increased drainage or redness  at the wound, or calf pain, call your surgeon's office.    Change dressing  As directed     Comments:      Change the dressing daily with sterile 4 x 4 inch gauze dressing and apply TED hose.  You may clean the incision with alcohol prior to redressing.    Constipation Prevention  As directed     Comments:      Drink plenty of fluids.  Prune juice may be helpful.  You may use a stool softener, such as Colace (over the counter) 100 mg twice a day.  Use MiraLax (over the counter) for constipation as needed.    Diet - low sodium heart healthy  As directed     Discharge instructions  As directed     Comments:      Total Knee Replacement Care After Refer to this sheet in the next few weeks. These discharge instructions provide you with general information on caring for yourself after you leave the hospital. Your caregiver may also give you specific instructions. Your treatment has been planned according to the most current medical practices available, but unavoidable complications sometimes occur. If you have any problems or questions after discharge, please call your caregiver. Regaining a near full range of motion of your knee within the first 3 to 6 weeks after surgery is critical. HOME CARE INSTRUCTIONS  You may resume a normal diet and activities as directed.  Perform exercises as directed.  Place yellow foam block, yellow side up under heel at all times except when in CPM or when walking.  DO NOT modify, tear, cut, or change in any way. You will receive physical therapy daily  Take showers instead of baths until informed otherwise.  Change bandages (dressings)daily Do not take over-the-counter or prescription medicines for pain, discomfort, or fever. Eat a well-balanced diet.  Avoid lifting or driving until you are instructed otherwise.  Make an appointment to see your caregiver for stitches (suture) or staple removal as directed.  If you have been sent home with a continuous passive motion  machine (CPM machine), 0-90 degrees 6 hrs a day   2 hrs a shift SEEK MEDICAL CARE IF: You have swelling of your calf or leg.  You develop shortness of breath or chest pain.  You have redness, swelling, or increasing pain in the wound.  There is pus or any unusual drainage coming from the surgical site.  You notice a bad smell coming from the surgical site or dressing.  The surgical site breaks open after sutures or staples have been removed.  There is persistent bleeding from the suture or staple line.  You are getting worse or are not improving.  You have any other questions or concerns.  SEEK IMMEDIATE MEDICAL CARE IF:  You have a fever.  You develop a rash.  You have difficulty breathing.  You develop any reaction or side effects to medicines given.  Your knee motion is decreasing rather than improving.  MAKE SURE YOU:  Understand these instructions.  Will watch your condition.  Will get help right  away if you are not doing well or get worse.    Do not put a pillow under the knee. Place it under the heel.  As directed     Comments:      Place yellow foam block, yellow side up under heel at all times except when in CPM or when walking.  DO NOT modify, tear, cut, or change in any way the yellow foam block.    Increase activity slowly as tolerated  As directed     TED hose  As directed     Comments:      Use stockings (TED hose) for 2 weeks on both leg(s).  You may remove them at night for sleeping.       Follow-up Information   Follow up with Nilda Simmer, MD On 10/05/2012. (appt time 2:15)    Contact information:   [address] [place] [address] [phone]        Signed: Pascal Lux 09/22/2012, 9:26 AM

## 2012-09-22 NOTE — Progress Notes (Signed)
Physical Therapy Treatment Patient Details Name: Joel Ford MRN: [id number] DOB: 1950-09-01 Today's Date: 09/22/2012 Time: 6578-4696 PT Time Calculation (min): 34 min  PT Assessment / Plan / Recommendation Comments on Treatment Session  Patient progressing very well this morning. Anticipate discharge home this AM per PA. Ambulation, stairs and therex all reviewed.     Follow Up Recommendations  Home health PT;Supervision/Assistance - 24 hour     Does the patient have the potential to tolerate intense rehabilitation     Barriers to Discharge        Equipment Recommendations  None recommended by PT    Recommendations for Other Services    Frequency 7X/week   Plan Discharge plan remains appropriate;Frequency remains appropriate    Precautions / Restrictions Precautions Precautions: Knee Knee Immobilizer - Left: Discontinue once straight leg raise with < 10 degree lag Restrictions LLE Weight Bearing: Weight bearing as tolerated   Pertinent Vitals/Pain no apparent distress     Mobility  Bed Mobility Bed Mobility: Sit to Supine Supine to Sit: 5: Supervision Sitting - Scoot to Edge of Bed: 5: Supervision Sit to Supine: 5: Supervision Details for Bed Mobility Assistance: increased time but did without cueing Transfers Sit to Stand: 5: Supervision Stand to Sit: 5: Supervision Details for Transfer Assistance: Cues for hand placement Ambulation/Gait Ambulation/Gait Assistance: 5: Supervision Ambulation Distance (Feet): 200 Feet Assistive device: Rolling walker Ambulation/Gait Assistance Details: Cues for RW placement and sequency Gait Pattern: Step-through pattern;Decreased stride length Stairs: Yes Stairs Assistance: 4: Min assist Stairs Assistance Details (indicate cue type and reason): Min A for RW. Cues for technique and sequence Stair Management Technique: No rails;Step to pattern;Backwards;With walker Number of Stairs: 2    Exercises Total Joint Exercises Ankle  Circles/Pumps: AROM;Both;10 reps;Seated Quad Sets: AROM;Left;10 reps Heel Slides: AROM;Left;10 reps Hip ABduction/ADduction: AROM;Left;10 reps Straight Leg Raises: AROM;Left;10 reps   PT Diagnosis:    PT Problem List:   PT Treatment Interventions:     PT Goals Acute Rehab PT Goals PT Goal: Supine/Side to Sit - Progress: Progressing toward goal PT Goal: Sit to Supine/Side - Progress: Progressing toward goal PT Goal: Sit to Stand - Progress: Met PT Goal: Ambulate - Progress: Met PT Goal: Up/Down Stairs - Progress: Progressing toward goal PT Goal: Perform Home Exercise Program - Progress: Progressing toward goal  Visit Information  Last PT Received On: 09/22/12 Assistance Needed: +1    Subjective Data      Cognition  Cognition Overall Cognitive Status: Appears within functional limits for tasks assessed/performed Arousal/Alertness: Awake/alert Orientation Level: Oriented X4 / Intact Behavior During Session: [facility] for tasks performed    Balance     End of Session PT - End of Session Equipment Utilized During Treatment: Gait belt Activity Tolerance: Patient tolerated treatment well Patient left: in chair;with call bell/phone within reach Nurse Communication: Mobility status   GP     Fredrich Birks 09/22/2012, 8:34 AM 09/22/2012 Fredrich Birks PTA [phone] pager [phone] office

## 2013-05-13 ENCOUNTER — Other Ambulatory Visit: Payer: Self-pay

## 2013-09-15 ENCOUNTER — Ambulatory Visit (INDEPENDENT_AMBULATORY_CARE_PROVIDER_SITE_OTHER): Payer: Managed Care, Other (non HMO) | Admitting: Sports Medicine

## 2013-09-15 ENCOUNTER — Ambulatory Visit (INDEPENDENT_AMBULATORY_CARE_PROVIDER_SITE_OTHER): Payer: Managed Care, Other (non HMO)

## 2013-09-15 ENCOUNTER — Encounter: Payer: Self-pay | Admitting: Sports Medicine

## 2013-09-15 VITALS — BP 146/88 | HR 80 | Ht 68.0 in | Wt 230.0 lb

## 2013-09-15 DIAGNOSIS — IMO0002 Reserved for concepts with insufficient information to code with codable children: Secondary | ICD-10-CM

## 2013-09-15 DIAGNOSIS — M47814 Spondylosis without myelopathy or radiculopathy, thoracic region: Secondary | ICD-10-CM

## 2013-09-15 DIAGNOSIS — M47817 Spondylosis without myelopathy or radiculopathy, lumbosacral region: Secondary | ICD-10-CM

## 2013-09-15 MED ORDER — TRAMADOL HCL 50 MG PO TABS: ORAL_TABLET | ORAL | Status: DC

## 2013-09-15 MED ORDER — CYCLOBENZAPRINE HCL 10 MG PO TABS: ORAL_TABLET | ORAL | Status: DC

## 2013-09-15 MED ORDER — PREDNISONE 50 MG PO TABS: ORAL_TABLET | ORAL | Status: DC

## 2013-09-15 NOTE — Assessment & Plan Note (Signed)
This pleasant 63 year old male does have classic right-sided thoracic versus upper lumbar radicular symptoms in what seems to be the T11 or T10 distribution. Formal physical therapy, steroids, NSAIDs, tramadol, Flexeril. X-rays. Return to see me in one month, MRI for interventional injection planning if no better.

## 2013-09-15 NOTE — Progress Notes (Signed)
   Subjective:    I'm seeing this patient as a consultation for:  Crawford GivensGraham Duncan M.D.  CC:  Low back pain  HPI: This is a pleasant 63 year old male with a history of spine spondylosis, he comes in with a several-day history of acute worsening of chronic pain. He localized pain in the right paralumbar and parathoracic muscles with radiation around the right sided lower abdomen near the umbilicus, worse when sitting for long periods of time, rotation, and with bending. Not worse with Valsalva. No bowel or bladder dysfunction, no trauma, no constitutional symptoms.  Past medical history, Surgical history, Family history not pertinant except as noted below, Social history, Allergies, and medications have been entered into the medical record, reviewed, and no changes needed.   Review of Systems: No headache, visual changes, nausea, vomiting, diarrhea, constipation, dizziness, abdominal pain, skin rash, fevers, chills, night sweats, weight loss, swollen lymph nodes, body aches, joint swelling, muscle aches, chest pain, shortness of breath, mood changes, visual or auditory hallucinations.   Objective:   General: Well Developed, well nourished, and in no acute distress.  Neuro/Psych: Alert and oriented x3, extra-ocular muscles intact, able to move all 4 extremities, sensation grossly intact. Skin: Warm and dry, no rashes noted.  Respiratory: Not using accessory muscles, speaking in full sentences, trachea midline.  Cardiovascular: Pulses palpable, no extremity edema. Abdomen: Does not appear distended. Back Exam:  Inspection: Unremarkable  Motion: Flexion 45 deg, Extension 45 deg, Side Bending to 45 deg bilaterally,  Rotation to 45 deg bilaterally  SLR laying: Negative  XSLR laying: Negative  Palpable tenderness: None. FABER: negative. Sensory change: Gross sensation intact to all lumbar and sacral dermatomes.  Reflexes: 2+ at both patellar tendons, 2+ at achilles tendons, Babinski's downgoing.    Strength at foot  Plantar-flexion: 5/5 Dorsi-flexion: 5/5 Eversion: 5/5 Inversion: 5/5  Leg strength  Quad: 5/5 Hamstring: 5/5 Hip flexor: 5/5 Hip abductors: 5/5  Gait unremarkable.  X-rays were reviewed, thoracic spine shows diffuse degenerative change, or spine dose to L3 on L4 retrolisthesis with L3-L4 degenerative disc disease and lower lumbar facet arthrosis.  Impression and Recommendations:   This case required medical decision making of moderate complexity.

## 2013-09-20 ENCOUNTER — Ambulatory Visit: Payer: Managed Care, Other (non HMO) | Admitting: Physical Therapy

## 2013-10-15 ENCOUNTER — Encounter: Payer: Self-pay | Admitting: Sports Medicine

## 2013-10-15 ENCOUNTER — Ambulatory Visit (INDEPENDENT_AMBULATORY_CARE_PROVIDER_SITE_OTHER): Payer: Managed Care, Other (non HMO) | Admitting: Sports Medicine

## 2013-10-15 VITALS — BP 141/77 | HR 76 | Wt 226.0 lb

## 2013-10-15 DIAGNOSIS — IMO0002 Reserved for concepts with insufficient information to code with codable children: Secondary | ICD-10-CM

## 2013-10-15 NOTE — Assessment & Plan Note (Signed)
Symptoms have now resolved with conservative measures including physical therapy, steroids, muscle relaxers, and NSAIDs. Return as needed, he did have right-sided T10 versus T11 radicular symptoms with a thoracic spine x-ray suggestive. He would be a candidate for a thoracic epidural should symptoms return.

## 2013-10-15 NOTE — Addendum Note (Signed)
Addended by: Monica BectonHEKKEKANDAM, Maja Mccaffery J on: 10/15/2013 01:08 PM   Modules accepted: Level of Service

## 2013-10-15 NOTE — Progress Notes (Signed)
  Subjective:    CC: Followup  HPI: Thoracic degenerative disc disease: Joel Ford returns, we treated him conservatively for right-sided thoracic radiculitis in the T11 versus T10 distribution. He returns today pain-free and happy with his results.  Past medical history, Surgical history, Family history not pertinant except as noted below, Social history, Allergies, and medications have been entered into the medical record, reviewed, and no changes needed.   Review of Systems: No fevers, chills, night sweats, weight loss, chest pain, or shortness of breath.   Objective:    General: Well Developed, well nourished, and in no acute distress.  Neuro: Alert and oriented x3, extra-ocular muscles intact, sensation grossly intact.  HEENT: Normocephalic, atraumatic, pupils equal round reactive to light, neck supple, no masses, no lymphadenopathy, thyroid nonpalpable.  Skin: Warm and dry, no rashes. Cardiac: Regular rate and rhythm, no murmurs rubs or gallops, no lower extremity edema.  Respiratory: Clear to auscultation bilaterally. Not using accessory muscles, speaking in full sentences. Back Exam:  Inspection: Unremarkable  Motion: Flexion 45 deg, Extension 45 deg, Side Bending to 45 deg bilaterally,  Rotation to 45 deg bilaterally  SLR laying: Negative  XSLR laying: Negative  Palpable tenderness: None. FABER: negative. Sensory change: Gross sensation intact to all lumbar and sacral dermatomes.  Reflexes: 2+ at both patellar tendons, 2+ at achilles tendons, Babinski's downgoing.  Strength at foot  Plantar-flexion: 5/5 Dorsi-flexion: 5/5 Eversion: 5/5 Inversion: 5/5  Leg strength  Quad: 5/5 Hamstring: 5/5 Hip flexor: 5/5 Hip abductors: 5/5  Gait unremarkable.  Impression and Recommendations:

## 2013-12-15 ENCOUNTER — Emergency Department (HOSPITAL_COMMUNITY)
Admission: EM | Admit: 2013-12-15 | Discharge: 2013-12-15 | Disposition: A | Discharge status: Home / Self Care | Payer: Managed Care, Other (non HMO) | Attending: Emergency Medicine | Admitting: Emergency Medicine

## 2013-12-15 ENCOUNTER — Emergency Department (HOSPITAL_COMMUNITY): Payer: Managed Care, Other (non HMO)

## 2013-12-15 ENCOUNTER — Encounter (HOSPITAL_COMMUNITY): Payer: Self-pay | Admitting: Emergency Medicine

## 2013-12-15 DIAGNOSIS — Z8739 Personal history of other diseases of the musculoskeletal system and connective tissue: Secondary | ICD-10-CM | POA: Insufficient documentation

## 2013-12-15 DIAGNOSIS — Z8639 Personal history of other endocrine, nutritional and metabolic disease: Secondary | ICD-10-CM | POA: Insufficient documentation

## 2013-12-15 DIAGNOSIS — R109 Unspecified abdominal pain: Secondary | ICD-10-CM

## 2013-12-15 DIAGNOSIS — R110 Nausea: Secondary | ICD-10-CM | POA: Insufficient documentation

## 2013-12-15 DIAGNOSIS — Z862 Personal history of diseases of the blood and blood-forming organs and certain disorders involving the immune mechanism: Secondary | ICD-10-CM | POA: Insufficient documentation

## 2013-12-15 DIAGNOSIS — N200 Calculus of kidney: Secondary | ICD-10-CM

## 2013-12-15 DIAGNOSIS — R1033 Periumbilical pain: Secondary | ICD-10-CM | POA: Insufficient documentation

## 2013-12-15 DIAGNOSIS — Z9089 Acquired absence of other organs: Secondary | ICD-10-CM | POA: Insufficient documentation

## 2013-12-15 LAB — URINALYSIS, ROUTINE W REFLEX MICROSCOPIC
BILIRUBIN URINE: NEGATIVE
Glucose, UA: NEGATIVE mg/dL
Ketones, ur: NEGATIVE mg/dL
Leukocytes, UA: NEGATIVE
NITRITE: NEGATIVE
PH: 6 (ref 5.0–8.0)
Protein, ur: NEGATIVE mg/dL
SPECIFIC GRAVITY, URINE: 1.016 (ref 1.005–1.030)
Urobilinogen, UA: 1 mg/dL (ref 0.0–1.0)

## 2013-12-15 LAB — COMPREHENSIVE METABOLIC PANEL
ALBUMIN: 3.8 g/dL (ref 3.5–5.2)
ALK PHOS: 102 U/L (ref 39–117)
ALT: 9 U/L (ref 0–53)
AST: 15 U/L (ref 0–37)
BUN: 13 mg/dL (ref 6–23)
CO2: 23 mEq/L (ref 19–32)
Calcium: 9.1 mg/dL (ref 8.4–10.5)
Chloride: 102 mEq/L (ref 96–112)
Creatinine, Ser: 0.89 mg/dL (ref 0.50–1.35)
GFR calc Af Amer: >90 mL/min (ref >90)
GFR calc non Af Amer: 90 mL/min — ABNORMAL LOW (ref >90)
Glucose, Bld: 145 mg/dL — ABNORMAL HIGH (ref 70–99)
POTASSIUM: 4 meq/L (ref 3.7–5.3)
SODIUM: 141 meq/L (ref 137–147)
TOTAL PROTEIN: 7 g/dL (ref 6.0–8.3)
Total Bilirubin: 0.3 mg/dL (ref 0.3–1.2)

## 2013-12-15 LAB — CBC WITH DIFFERENTIAL/PLATELET
BASOS ABS: 0 10*3/uL (ref 0.0–0.1)
Basophils Relative: 0 % (ref 0–1)
EOS ABS: 0.1 10*3/uL (ref 0.0–0.7)
Eosinophils Relative: 2 % (ref 0–5)
HCT: 35.4 % — ABNORMAL LOW (ref 39.0–52.0)
HEMOGLOBIN: 10.9 g/dL — AB (ref 13.0–17.0)
LYMPHS PCT: 12 % (ref 12–46)
Lymphs Abs: 0.7 10*3/uL (ref 0.7–4.0)
MCH: 21.5 pg — ABNORMAL LOW (ref 26.0–34.0)
MCHC: 30.8 g/dL (ref 30.0–36.0)
MCV: 70 fL — ABNORMAL LOW (ref 78.0–100.0)
Monocytes Absolute: 0.5 10*3/uL (ref 0.1–1.0)
Monocytes Relative: 8 % (ref 3–12)
NEUTROS PCT: 78 % — AB (ref 43–77)
Neutro Abs: 4.8 10*3/uL (ref 1.7–7.7)
Platelets: 160 10*3/uL (ref 150–400)
RBC: 5.06 MIL/uL (ref 4.22–5.81)
RDW: 16.1 % — AB (ref 11.5–15.5)
WBC: 6.1 10*3/uL (ref 4.0–10.5)

## 2013-12-15 LAB — URINE MICROSCOPIC-ADD ON

## 2013-12-15 LAB — LIPASE, BLOOD: Lipase: 63 U/L — ABNORMAL HIGH (ref 11–59)

## 2013-12-15 MED ORDER — HYDROCODONE-ACETAMINOPHEN 5-325 MG PO TABS: 1.0000 | ORAL_TABLET | ORAL | Status: DC | PRN

## 2013-12-15 MED ORDER — MORPHINE SULFATE 4 MG/ML IJ SOLN
6.0000 mg | Freq: Once | INTRAMUSCULAR | Status: AC
  Administered 2013-12-15: 6 mg via INTRAVENOUS
  Filled 2013-12-15: qty 2

## 2013-12-15 MED ORDER — KETOROLAC TROMETHAMINE 30 MG/ML IJ SOLN
30.0000 mg | Freq: Once | INTRAMUSCULAR | Status: AC
  Administered 2013-12-15: 30 mg via INTRAVENOUS
  Filled 2013-12-15: qty 1

## 2013-12-15 MED ORDER — IOHEXOL 300 MG/ML  SOLN
25.0000 mL | INTRAMUSCULAR | Status: DC
  Administered 2013-12-15: 25 mL via ORAL

## 2013-12-15 MED ORDER — SODIUM CHLORIDE 0.9 % IV BOLUS (SEPSIS)
500.0000 mL | Freq: Once | INTRAVENOUS | Status: AC
  Administered 2013-12-15: 500 mL via INTRAVENOUS

## 2013-12-15 MED ORDER — IOHEXOL 300 MG/ML  SOLN
100.0000 mL | Freq: Once | INTRAMUSCULAR | Status: AC | PRN
  Administered 2013-12-15: 100 mL via INTRAVENOUS

## 2013-12-15 NOTE — ED Notes (Signed)
Pt currently in bathroom

## 2013-12-15 NOTE — ED Notes (Signed)
Patient here with complaint of abdominal pain starting around midnight. Patient had returned from a meeting and was awake at the time. States that he didn't eat anything unusual, and nobody else who ate with him yesterday is currently sick.

## 2013-12-15 NOTE — Discharge Instructions (Signed)
Ibuprofen every 6 hours for pain. Results and differential diagnosis were discussed with the patient/parent/guardian. Close follow up outpatient was discussed, comfortable with the plan.   If you were given medicines take as directed.  If you are on coumadin or contraceptives realize their levels and effectiveness is altered by many different medicines.  If you have any reaction (rash, tongues swelling, other) to the medicines stop taking and see a physician.   Please follow up as directed and return to the ER or see a physician for new or worsening symptoms.  Thank you. Filed Vitals:   12/15/13 0420 12/15/13 0530 12/15/13 0600  BP: 144/72 148/79 130/77  Pulse: 76 84 91  Temp: 97.7 F (36.5 C)    TempSrc: Oral    Resp: 20    Height: 5\' 10"  (1.778 m)    Weight: 242 lb (109.77 kg)    SpO2: 97% 94% 92%

## 2013-12-15 NOTE — ED Provider Notes (Signed)
CSN: [id number]     Arrival date & time 12/15/13  0403 History   First MD Initiated Contact with Patient 12/15/13 0515     Chief Complaint  Patient presents with  . Abdominal Pain     (Consider location/radiation/quality/duration/timing/severity/associated sxs/prior Treatment) HPI Comments: 63 year old male with lipids, kidney stone, gout, appendectomy history through the with abdominal pain. Patient had gradual worsening abdominal pain periumbilical that moved to the right lower flank since late this evening. No history of similar. Patient had a kidney stone the past however this feels different. No urinary symptoms fevers chills or vomiting. Patient has nausea. No known aneurysm history.  Patient is a 63 y.o. male presenting with abdominal pain. The history is provided by the patient.  Abdominal Pain Associated symptoms: nausea   Associated symptoms: no chest pain, no chills, no dysuria, no fever, no shortness of breath and no vomiting     Past Medical History  Diagnosis Date  . Gout   . HYPERLIPIDEMIA 07/04/2009    Qualifier: Diagnosis of  By: Jillyn Hidden FNP, Mcarthur Rossetti   . ABNORMAL ELECTROCARDIOGRAM 07/04/2009    Qualifier: Diagnosis of  By: Jillyn Hidden FNP, Mcarthur Rossetti   . HYPERGLYCEMIA 07/27/2009    Qualifier: Diagnosis of  By: Hetty Ely MD, Franne Grip   . RENAL CALCULUS 02/15/2008    Qualifier: Diagnosis of  By: Jillyn Hidden FNP, Mcarthur Rossetti   . S/P total knee replacement 08/07/2009    right  . Left knee DJD     OA   Past Surgical History  Procedure Laterality Date  . Appendectomy      age 68  . Orthoscopy      Right and left knee  . Total knee arthroplasty  2011    Right  . Cardiac catheterization  01/23/2005    Normal  . Finger surgery      RIGHT INDEX, REATTACHED  . Total knee arthroplasty Left 09/21/2012    Dr Thurston Hole  . Total knee arthroplasty Left 09/21/2012    Procedure: TOTAL KNEE ARTHROPLASTY;  Surgeon: Nilda Simmer, MD;  Location: [facility] OR;  Service:  Orthopedics;  Laterality: Left;   Family History  Problem Relation Age of Onset  . Stroke Father   . Colon cancer Neg Hx   . Prostate cancer Neg Hx    History  Substance Use Topics  . Smoking status: Never Smoker   . Smokeless tobacco: Current User    Types: Chew  . Alcohol Use: No    Review of Systems  Constitutional: Negative for fever and chills.  HENT: Negative for congestion.   Eyes: Negative for visual disturbance.  Respiratory: Negative for shortness of breath.   Cardiovascular: Negative for chest pain.  Gastrointestinal: Positive for nausea and abdominal pain. Negative for vomiting.  Genitourinary: Negative for dysuria.  Musculoskeletal: Negative for back pain, neck pain and neck stiffness.  Skin: Negative for rash.  Neurological: Negative for light-headedness and headaches.      Allergies  Review of patient's allergies indicates no known allergies.  Home Medications   Prior to Admission medications   Medication Sig Start Date End Date Taking? Authorizing Provider  ibuprofen (ADVIL,MOTRIN) 200 MG tablet Take 200 mg by mouth every 6 (six) hours as needed for mild pain.   Yes Historical Provider, MD   BP 130/77  Pulse 91  Temp(Src) 97.7 F (36.5 C) (Oral)  Resp 20  Ht 5\' 10"  (1.778 m)  Wt 242 lb (109.77 kg)  BMI 34.72 kg/m2  SpO2 92% Physical  Exam  Nursing note and vitals reviewed. Constitutional: He is oriented to person, place, and time. He appears well-developed and well-nourished.  HENT:  Head: Normocephalic and atraumatic.  Mild dry mucous membranes  Eyes: Conjunctivae are normal. Right eye exhibits no discharge. Left eye exhibits no discharge.  Neck: Normal range of motion. Neck supple. No tracheal deviation present.  Cardiovascular: Normal rate and regular rhythm.   Pulmonary/Chest: Effort normal and breath sounds normal.  Abdominal: Soft. He exhibits no distension. There is tenderness (periumbilical right lower flank). There is no guarding.   Musculoskeletal: He exhibits no edema.  Neurological: He is alert and oriented to person, place, and time.  Skin: Skin is warm. No rash noted.  Psychiatric: He has a normal mood and affect.    ED Course  Procedures (including critical care time) Labs Review Labs Reviewed  CBC WITH DIFFERENTIAL - Abnormal; Notable for the following:    Hemoglobin 10.9 (*)    HCT 35.4 (*)    MCV 70.0 (*)    MCH 21.5 (*)    RDW 16.1 (*)    Neutrophils Relative % 78 (*)    All other components within normal limits  COMPREHENSIVE METABOLIC PANEL - Abnormal; Notable for the following:    Glucose, Bld 145 (*)    GFR calc non Af Amer 90 (*)    All other components within normal limits  LIPASE, BLOOD - Abnormal; Notable for the following:    Lipase 63 (*)    All other components within normal limits  URINALYSIS, ROUTINE W REFLEX MICROSCOPIC - Abnormal; Notable for the following:    Hgb urine dipstick TRACE (*)    All other components within normal limits  URINE MICROSCOPIC-ADD ON    Imaging Review Ct Abdomen Pelvis W Contrast  12/15/2013   CLINICAL DATA:  Abdominal pain beginning at midnight.  EXAM: CT ABDOMEN AND PELVIS WITH CONTRAST  TECHNIQUE: Multidetector CT imaging of the abdomen and pelvis was performed using the standard protocol following bolus administration of intravenous contrast.  CONTRAST:  100mL OMNIPAQUE IOHEXOL 300 MG/ML  SOLN  COMPARISON:  04/17/2009  FINDINGS: Atelectasis or infiltration in both lung bases.  3 mm stone in the distal right ureter at the ureterovesical junction with moderate proximal ureterectasis and pyelocaliectasis and with stranding around the right kidney. Additional nonobstructing intrarenal stone in the lower pole right kidney.  Liver, spleen, gallbladder, pancreas, adrenal glands, abdominal aorta, and inferior vena cava are unremarkable. No significant lymphadenopathy in the abdomen or retroperitoneum. Small parenchymal cysts in the left kidney. No hydronephrosis.  Small accessory spleen. Stomach and small bowel are decompressed. Stool-filled colon without distention. Diverticulosis of the descending and sigmoid region. No diverticulitis. No free air or free fluid in the abdomen.  Pelvis: Mild enlarged prostate gland measuring 5 x 3.4 cm. Bladder wall is not thickened. Appendix is not identified. No free or loculated pelvic fluid collections.  IMPRESSION: 3 mm stone in the distal right ureter at the ureterovesical junction with moderate proximal obstruction. Additional nonobstructing intrarenal stones on the right.   Electronically Signed   By: Burman NievesWilliam  Stevens M.D.   On: 12/15/2013 06:48     EKG Interpretation None      MDM   Final diagnoses:  Abdominal pain  Right kidney stone    Patient presents with nonfocal abdominal pain. Discussed broad differential with patient. Blood work overall unremarkable, reviewed. CT abdomen with contrast showed 3 mm kidney stone distal right ureter. Narcotic pain meds followed by Toradol improved  patient's pain.  Followup outpatient with urology discussed.  Results and differential diagnosis were discussed with the patient/parent/guardian. Close follow up outpatient was discussed, comfortable with the plan.   Medications  iohexol (OMNIPAQUE) 300 MG/ML solution 25 mL (25 mLs Oral Contrast Given 12/15/13 0603)  ketorolac (TORADOL) 30 MG/ML injection 30 mg (not administered)  morphine 4 MG/ML injection 6 mg (6 mg Intravenous Given 12/15/13 0619)  sodium chloride 0.9 % bolus 500 mL (500 mLs Intravenous New Bag/Given 12/15/13 0619)  iohexol (OMNIPAQUE) 300 MG/ML solution 100 mL (100 mLs Intravenous Contrast Given 12/15/13 0640)    Filed Vitals:   12/15/13 0420 12/15/13 0530 12/15/13 0600  BP: 144/72 148/79 130/77  Pulse: 76 84 91  Temp: 97.7 F (36.5 C)    TempSrc: Oral    Resp: 20    Height: 5\' 10"  (1.778 m)    Weight: 242 lb (109.77 kg)    SpO2: 97% 94% 92%       Enid Skeens, MD 12/15/13 [phone]

## 2013-12-15 NOTE — ED Notes (Signed)
Pt A&OX4, ambulatory at d/c with steady gait but wheeled out of ED via wheelchair. NAD.

## 2016-08-05 ENCOUNTER — Encounter (HOSPITAL_COMMUNITY): Payer: Self-pay | Admitting: *Deleted

## 2016-08-05 ENCOUNTER — Observation Stay (HOSPITAL_COMMUNITY)
Admission: EM | Admit: 2016-08-05 | Discharge: 2016-08-07 | Disposition: A | Discharge status: Home / Self Care | Payer: Medicare Other | Attending: Internal Medicine | Admitting: Internal Medicine

## 2016-08-05 ENCOUNTER — Emergency Department (HOSPITAL_COMMUNITY): Payer: Medicare Other

## 2016-08-05 DIAGNOSIS — D519 Vitamin B12 deficiency anemia, unspecified: Secondary | ICD-10-CM | POA: Diagnosis not present

## 2016-08-05 DIAGNOSIS — E039 Hypothyroidism, unspecified: Secondary | ICD-10-CM | POA: Diagnosis present

## 2016-08-05 DIAGNOSIS — R42 Dizziness and giddiness: Secondary | ICD-10-CM | POA: Diagnosis not present

## 2016-08-05 DIAGNOSIS — R079 Chest pain, unspecified: Secondary | ICD-10-CM | POA: Diagnosis not present

## 2016-08-05 DIAGNOSIS — Z96653 Presence of artificial knee joint, bilateral: Secondary | ICD-10-CM | POA: Diagnosis not present

## 2016-08-05 DIAGNOSIS — I480 Paroxysmal atrial fibrillation: Secondary | ICD-10-CM | POA: Diagnosis not present

## 2016-08-05 DIAGNOSIS — R002 Palpitations: Secondary | ICD-10-CM | POA: Diagnosis not present

## 2016-08-05 DIAGNOSIS — R0602 Shortness of breath: Secondary | ICD-10-CM | POA: Diagnosis present

## 2016-08-05 DIAGNOSIS — D509 Iron deficiency anemia, unspecified: Secondary | ICD-10-CM | POA: Insufficient documentation

## 2016-08-05 DIAGNOSIS — R0789 Other chest pain: Secondary | ICD-10-CM | POA: Diagnosis not present

## 2016-08-05 DIAGNOSIS — D500 Iron deficiency anemia secondary to blood loss (chronic): Secondary | ICD-10-CM | POA: Diagnosis present

## 2016-08-05 DIAGNOSIS — I4891 Unspecified atrial fibrillation: Secondary | ICD-10-CM

## 2016-08-05 LAB — BASIC METABOLIC PANEL
Anion gap: 10 (ref 5–15)
BUN: 18 mg/dL (ref 6–20)
CALCIUM: 8.7 mg/dL — AB (ref 8.9–10.3)
CO2: 24 mmol/L (ref 22–32)
CREATININE: 1.16 mg/dL (ref 0.61–1.24)
Chloride: 105 mmol/L (ref 101–111)
Glucose, Bld: 110 mg/dL — ABNORMAL HIGH (ref 65–99)
Potassium: 3.9 mmol/L (ref 3.5–5.1)
SODIUM: 139 mmol/L (ref 135–145)

## 2016-08-05 LAB — CBC
HCT: 36.4 % — ABNORMAL LOW (ref 39.0–52.0)
HEMOGLOBIN: 10.6 g/dL — AB (ref 13.0–17.0)
MCH: 19.4 pg — AB (ref 26.0–34.0)
MCHC: 29.1 g/dL — ABNORMAL LOW (ref 30.0–36.0)
MCV: 66.8 fL — ABNORMAL LOW (ref 78.0–100.0)
PLATELETS: 148 10*3/uL — AB (ref 150–400)
RBC: 5.45 MIL/uL (ref 4.22–5.81)
RDW: 17.7 % — ABNORMAL HIGH (ref 11.5–15.5)
WBC: 6.6 10*3/uL (ref 4.0–10.5)

## 2016-08-05 LAB — I-STAT TROPONIN, ED: TROPONIN I, POC: 0.02 ng/mL (ref 0.00–0.08)

## 2016-08-05 MED ORDER — ASPIRIN 81 MG PO CHEW
243.0000 mg | CHEWABLE_TABLET | Freq: Once | ORAL | Status: AC
  Administered 2016-08-05: 243 mg via ORAL
  Filled 2016-08-05: qty 3

## 2016-08-05 MED ORDER — DILTIAZEM HCL 60 MG PO TABS
60.0000 mg | ORAL_TABLET | Freq: Once | ORAL | Status: AC
  Administered 2016-08-05: 60 mg via ORAL
  Filled 2016-08-05: qty 1

## 2016-08-05 MED ORDER — ASPIRIN 81 MG PO CHEW: 324.0000 mg | CHEWABLE_TABLET | Freq: Once | ORAL | Status: DC

## 2016-08-05 NOTE — ED Triage Notes (Addendum)
PT states chest pressure and dyspnea and dizziness and sob on exertion since Sat, that increased today. EKG shows afib - pt states no hx of afib.

## 2016-08-05 NOTE — ED Provider Notes (Signed)
[facility]-EMERGENCY DEPT Provider Note  CSN: [id number] Arrival date & time: 08/05/16  1358  History   Chief Complaint Chief Complaint  Patient presents with  . Shortness of Breath  . Dizziness   HPI Joel Ford is a 66 y.o. male.  The history is provided by the patient and medical records. No language interpreter was used.  Illness  This is a new problem. The current episode started 2 days ago. The problem occurs constantly. The problem has been gradually worsening. Associated symptoms include chest pain. Pertinent negatives include no abdominal pain, no headaches and no shortness of breath. The symptoms are aggravated by walking and exertion. The symptoms are relieved by rest.    Past Medical History:  Diagnosis Date  . ABNORMAL ELECTROCARDIOGRAM 07/04/2009   Qualifier: Diagnosis of  By: Jillyn Hidden FNP, Mcarthur Rossetti   . Gout   . HYPERGLYCEMIA 07/27/2009   Qualifier: Diagnosis of  By: Hetty Ely MD, Franne Grip   . HYPERLIPIDEMIA 07/04/2009   Qualifier: Diagnosis of  By: Jillyn Hidden FNP, Mcarthur Rossetti   . Left knee DJD    OA  . RENAL CALCULUS 02/15/2008   Qualifier: Diagnosis of  By: Jillyn Hidden FNP, Mcarthur Rossetti   . S/P total knee replacement 08/07/2009   right   Patient Active Problem List   Diagnosis Date Noted  . Paroxysmal atrial fibrillation (HCC) 08/05/2016  . Gout   . Left knee DJD   . Preop cardiovascular exam 09/06/2012  . Arthritis of knee, degenerative 08/30/2012  . EXTERNAL HEMORRHOIDS 12/26/2009  . S/P total knee replacement 08/07/2009  . HYPERGLYCEMIA 07/27/2009  . HYPERLIPIDEMIA 07/04/2009  . ABNORMAL ELECTROCARDIOGRAM 07/04/2009  . CHEST WALL PAIN, ACUTE 08/30/2008  . RENAL CALCULUS 02/15/2008  . Thoracic or lumbosacral neuritis or radiculitis 02/15/2008  . GOUT 01/22/2008   Past Surgical History:  Procedure Laterality Date  . APPENDECTOMY     age 10  . CARDIAC CATHETERIZATION  01/23/2005   Normal  . FINGER SURGERY     RIGHT INDEX, REATTACHED  .  orthoscopy     Right and left knee  . TOTAL KNEE ARTHROPLASTY  2011   Right  . TOTAL KNEE ARTHROPLASTY Left 09/21/2012   Dr Thurston Hole  . TOTAL KNEE ARTHROPLASTY Left 09/21/2012   Procedure: TOTAL KNEE ARTHROPLASTY;  Surgeon: Nilda Simmer, MD;  Location: [facility] OR;  Service: Orthopedics;  Laterality: Left;   Home Medications    Prior to Admission medications   Medication Sig Start Date End Date Taking? Authorizing Provider  ibuprofen (ADVIL,MOTRIN) 200 MG tablet Take 800 mg by mouth every 6 (six) hours as needed for headache or mild pain.    Yes Historical Provider, MD   Family History Family History  Problem Relation Age of Onset  . Stroke Father   . Colon cancer Neg Hx   . Prostate cancer Neg Hx    Social History Social History  Substance Use Topics  . Smoking status: Never Smoker  . Smokeless tobacco: Current User    Types: Chew  . Alcohol use No   Allergies   Patient has no known allergies.  Review of Systems Review of Systems  Respiratory: Negative for shortness of breath.   Cardiovascular: Positive for chest pain and leg swelling.  Gastrointestinal: Negative for abdominal pain.  Neurological: Positive for light-headedness. Negative for headaches.  All other systems reviewed and are negative.  Physical Exam Updated Vital Signs BP 125/87   Pulse 92   Temp 98.1 F (36.7 C)   Resp 17  Ht 5\' 11"  (1.803 m)   Wt 108.9 kg   SpO2 98%   BMI 33.47 kg/m   Physical Exam  Constitutional: He is oriented to person, place, and time. No distress.  Overweight elderly Caucasian male  HENT:  Head: Normocephalic and atraumatic.  Eyes: EOM are normal. Pupils are equal, round, and reactive to light.  Neck: Normal range of motion. Neck supple.  Cardiovascular: Normal heart sounds.   Irregularly irregular rhythm with heart rate anywhere from 70s to 130s  Pulmonary/Chest: Effort normal and breath sounds normal.  Abdominal: Soft. Bowel sounds are normal. He exhibits no distension.  There is no tenderness.  Musculoskeletal: Normal range of motion.  Neurological: He is alert and oriented to person, place, and time.  Skin: Skin is warm and dry. Capillary refill takes less than 2 seconds. He is not diaphoretic.  Nursing note and vitals reviewed.   ED Treatments / Results  Labs (all labs ordered are listed, but only abnormal results are displayed) Labs Reviewed  BASIC METABOLIC PANEL - Abnormal; Notable for the following:       Result Value   Glucose, Bld 110 (*)    Calcium 8.7 (*)    All other components within normal limits  CBC - Abnormal; Notable for the following:    Hemoglobin 10.6 (*)    HCT 36.4 (*)    MCV 66.8 (*)    MCH 19.4 (*)    MCHC 29.1 (*)    RDW 17.7 (*)    Platelets 148 (*)    All other components within normal limits  I-STAT TROPOININ, ED   EKG  EKG Interpretation  Date/Time:  Monday August 05 2016 14:36:23 EST Ventricular Rate:  121 PR Interval:    QRS Duration: 86 QT Interval:  328 QTC Calculation: 465 R Axis:   40 Text Interpretation:  Atrial fibrillation with rapid ventricular response Septal infarct , age undetermined T wave abnormality, consider lateral ischemia Abnormal ECG Confirmed by Rubin Payor  MD, NATHAN [phone]) on 08/05/2016 7:58:42 PM      Radiology Dg Chest 2 View  Result Date: 08/05/2016 CLINICAL DATA:  Chest pressure EXAM: CHEST  2 VIEW COMPARISON:  09/14/2012 FINDINGS: Normal heart size and mediastinal contours. There is no edema, consolidation, effusion, or pneumothorax. Spondylosis with multilevel thoracic ankylosis. No acute osseous finding. IMPRESSION: No acute finding. Electronically Signed   By: Marnee Spring M.D.   On: 08/05/2016 15:00   Procedures Procedures (including critical care time)  Medications Ordered in ED Medications  diltiazem (CARDIZEM) tablet 60 mg (60 mg Oral Given 08/05/16 2048)  aspirin chewable tablet 243 mg (243 mg Oral Given 08/05/16 2048)    Initial Impression / Assessment and Plan  / ED Course  I have reviewed the triage vital signs and the nursing notes.  66 y.o. male with above stated PMHx, HPI, and physical. Symptom onset 2 days ago. Patient with lightheadedness associated with irregular palpitations. Patient with onset of left-sided chest pain earlier today. Exertional. Nonradiating.  EKG showing A-fib with heart rate in 70's. I-STAT troponin not elevated. CBC without leukocytosis but notable for chronic anemia. BMP unremarkable. Patient intermittently spiking HR to 130s on exam room. Given PO Diltiazem. Patient without history of diabetes or hypertension or hyperlipidemia. Given age patient has a chads Vascor of 1.  Laboratory and imaging results were personally reviewed by myself and used in the medical decision making of this patient's treatment and disposition.  Pt admitted to medicine for further evaluation and management of  new onset A. fib. Pt understands and agrees with the plan and has no further questions or concerns.   Pt care discussed with and followed by my attending, Dr. Benjiman CoreNathan Pickering  Angelina Okyan Delories Mauri, MD Pager [phone]  Final Clinical Impressions(s) / ED Diagnoses   Final diagnoses:  New onset atrial fibrillation (HCC)  Lightheadedness  Palpitations  Left sided chest pain   New Prescriptions New Prescriptions   No medications on file     Angelina Okyan Azaryah Oleksy, MD 08/05/16 2203    Benjiman CoreNathan Pickering, MD 08/05/16 2325

## 2016-08-05 NOTE — H&P (Signed)
History and Physical  Patient Name: Joel Ford     RUE:[id number]    DOB: 1950-11-21    DOA: 08/05/2016 PCP: Crawford GivensGraham Duncan, MD   Patient coming from: Home  Chief Complaint: Presyncope  HPI: Joel Ford is a [age] y.o. male with no significant past medical history who presents with pre-syncope.  The patient was in his usual state of health until Saturday 2 days ago when he had sudden onset light-headedness, pre-syncope, and shortness of breath with exertion.  Yesterday his wife noticed he seemed "very winded from just walking down the aisle in the grocery store" and today he felt like he had a vague intermittent chest tightness, and so he came to the ER.  ED course: -Afebrile, heart rate 93 and irregular, BP 152/54, respirations 100 -Na 139, K 3.9, Cr 1.16 (baseline 0.9-1.1), WBC 6.6K, Hgb 10.6 and microcytic, baseline 11-12 -Troponin 0.02 -ECG showed new Afib rate 121 -He was given diltiazem 60 mg orally and TRH were asked to evaluate   He has no personal history of CHF, diabetes, vascular disease, stroke, hypertension or heart disease.  He presented with chest pain in 2006, underwent LHC that showed non-obstructive disease, and has been healthy since then.  Does not see a PCP regularly.     ROS: Review of Systems  Respiratory: Positive for shortness of breath. Negative for cough, sputum production and wheezing.   Cardiovascular: Positive for chest pain and palpitations. Negative for orthopnea, leg swelling and PND.  Neurological: Positive for dizziness. Negative for loss of consciousness.  All other systems reviewed and are negative.         Past Medical History:  Diagnosis Date  . ABNORMAL ELECTROCARDIOGRAM 07/04/2009   Qualifier: Diagnosis of  By: Jillyn HiddenBean FNP, Mcarthur RossettiBillie-Lynn Daniels   . Gout   . HYPERGLYCEMIA 07/27/2009   Qualifier: Diagnosis of  By: Hetty ElySchaller MD, Franne Gripobert Neal   . HYPERLIPIDEMIA 07/04/2009   Qualifier: Diagnosis of  By: Jillyn HiddenBean FNP, Mcarthur RossettiBillie-Lynn Daniels   . Left  knee DJD    OA  . RENAL CALCULUS 02/15/2008   Qualifier: Diagnosis of  By: Jillyn HiddenBean FNP, Mcarthur RossettiBillie-Lynn Daniels   . S/P total knee replacement 08/07/2009   right    Past Surgical History:  Procedure Laterality Date  . APPENDECTOMY     age 66  . CARDIAC CATHETERIZATION  01/23/2005   Normal  . FINGER SURGERY     RIGHT INDEX, REATTACHED  . orthoscopy     Right and left knee  . TOTAL KNEE ARTHROPLASTY  2011   Right  . TOTAL KNEE ARTHROPLASTY Left 09/21/2012   Dr Thurston HoleWainer  . TOTAL KNEE ARTHROPLASTY Left 09/21/2012   Procedure: TOTAL KNEE ARTHROPLASTY;  Surgeon: Nilda Simmerobert A Wainer, MD;  Location: [facility] OR;  Service: Orthopedics;  Laterality: Left;    Social History: Patient lives with his wife.  The patient walks unassisted.  He is retired from the [employer].  He does not smoke.    No Known Allergies  Family history: family history includes Stroke in his father.  Prior to Admission medications   Medication Sig Start Date End Date Taking? Authorizing Provider  ibuprofen (ADVIL,MOTRIN) 200 MG tablet Take 800 mg by mouth every 6 (six) hours as needed for headache or mild pain.    Yes Historical Provider, MD       Physical Exam: BP 121/73   Pulse 73   Temp 98.1 F (36.7 C)   Resp 15   Ht 5\' 11"  (1.803 m)  Wt 108.9 kg (240 lb)   SpO2 98%   BMI 33.47 kg/m  General appearance: Well-developed, adult male, alert and in no acute distress.   Eyes: Anicteric, conjunctiva pink, lids and lashes normal. PERRL.    ENT: No nasal deformity, discharge, epistaxis.  Hearing normal. OP moist without lesions.   Neck: No neck masses.  Trachea midline.  No thyromegaly/tenderness. Lymph: No cervical or supraclavicular lymphadenopathy. Skin: Warm and dry.  No jaundice.  No suspicious rashes or lesions. Cardiac: Irregularly irregular, nl S1-S2, no murmurs appreciated.  Capillary refill is brisk.  JVP normal.  No LE edema.  Radial and DP pulses 2+ and symmetric. Respiratory: Normal respiratory rate and rhythm.   CTAB without rales or wheezes. Abdomen: Abdomen soft.  No TTP. No ascites, distension, hepatosplenomegaly.   MSK: No deformities or effusions.  No cyanosis or clubbing. Neuro: Cranial nerves 3-12 intact.  Sensation intact to light touch. Speech is fluent.  Muscle strength normal.    Psych: Sensorium intact and responding to questions, attention normal.  Behavior appropriate.  Affect normal.  Judgment and insight appear normal.     Labs on Admission:  I have personally reviewed following labs and imaging studies: CBC:  Recent Labs Lab 08/05/16 1435  WBC 6.6  HGB 10.6*  HCT 36.4*  MCV 66.8*  PLT 148*   Basic Metabolic Panel:  Recent Labs Lab 08/05/16 1435  NA 139  K 3.9  CL 105  CO2 24  GLUCOSE 110*  BUN 18  CREATININE 1.16  CALCIUM 8.7*   GFR: Estimated Creatinine Clearance: 79.7 mL/min (by C-G formula based on SCr of 1.16 mg/dL).  Liver Function Tests: No results for input(s): AST, ALT, ALKPHOS, BILITOT, PROT, ALBUMIN in the last 168 hours. No results for input(s): LIPASE, AMYLASE in the last 168 hours. No results for input(s): AMMONIA in the last 168 hours. Coagulation Profile: No results for input(s): INR, PROTIME in the last 168 hours. Cardiac Enzymes: No results for input(s): CKTOTAL, CKMB, CKMBINDEX, TROPONINI in the last 168 hours. BNP (last 3 results) No results for input(s): PROBNP in the last 8760 hours. HbA1C: No results for input(s): HGBA1C in the last 72 hours. CBG: No results for input(s): GLUCAP in the last 168 hours. Lipid Profile: No results for input(s): CHOL, HDL, LDLCALC, TRIG, CHOLHDL, LDLDIRECT in the last 72 hours. Thyroid Function Tests: No results for input(s): TSH, T4TOTAL, FREET4, T3FREE, THYROIDAB in the last 72 hours. Anemia Panel: No results for input(s): VITAMINB12, FOLATE, FERRITIN, TIBC, IRON, RETICCTPCT in the last 72 hours. Sepsis Labs: Invalid input(s): PROCALCITONIN, LACTICIDVEN No results found for this or any previous  visit (from the past 240 hour(s)).       Radiological Exams on Admission: Personally reviewed CXR clear: Dg Chest 2 View  Result Date: 08/05/2016 CLINICAL DATA:  Chest pressure EXAM: CHEST  2 VIEW COMPARISON:  09/14/2012 FINDINGS: Normal heart size and mediastinal contours. There is no edema, consolidation, effusion, or pneumothorax. Spondylosis with multilevel thoracic ankylosis. No acute osseous finding. IMPRESSION: No acute finding. Electronically Signed   By: Marnee Spring M.D.   On: 08/05/2016 15:00    EKG: Independently reviewed. Rate 121, QTc 465, atrial fibrillation, with some rate related TWI.    Assessment/Plan  1. Paroxysmal atrial fibrillation:  CHADS2Vasc 1 for age, overall low risk for VTE ~0.6%/yr. -Agree with observing overnight and consultation with Cardiology tomorrow re: anticoagulation vs DCCV or neither -Diltiazem 30 mg PO q6 for now -Care Management consult re: coverage of NOACs -Consult  to Cardiology, appreciate cares -Obtain TSH and mag -Echo etc. Per Cardiology      DVT prophylaxis: Lovenox  Code Status: FULL  Family Communication: Wife at bedside  Disposition Plan: Anticipate start diltiazem Consults called: Cardiology, via Inbasket Admission status: OBS At the point of initial evaluation, it is my clinical opinion that admission for OBSERVATION is reasonable and necessary because the patient's presenting complaints in the context of their chronic conditions represent sufficient risk of deterioration or significant morbidity to constitute reasonable grounds for close observation in the hospital setting, but that the patient may be medically stable for discharge from the hospital within 24 to 48 hours.    Medical decision making: Patient seen at 8:45 PM on 08/05/2016.  The patient was discussed with Dr. Joaquim Lai.  What exists of the patient's chart was reviewed in depth and summarized above.  Clinical condition: stable.        Alberteen Sam [facility] Pager [phone]

## 2016-08-06 ENCOUNTER — Observation Stay (HOSPITAL_BASED_OUTPATIENT_CLINIC_OR_DEPARTMENT_OTHER): Payer: Medicare Other

## 2016-08-06 DIAGNOSIS — D518 Other vitamin B12 deficiency anemias: Secondary | ICD-10-CM

## 2016-08-06 DIAGNOSIS — R55 Syncope and collapse: Secondary | ICD-10-CM | POA: Diagnosis not present

## 2016-08-06 DIAGNOSIS — I4891 Unspecified atrial fibrillation: Secondary | ICD-10-CM

## 2016-08-06 DIAGNOSIS — Z7901 Long term (current) use of anticoagulants: Secondary | ICD-10-CM

## 2016-08-06 DIAGNOSIS — D509 Iron deficiency anemia, unspecified: Secondary | ICD-10-CM | POA: Diagnosis present

## 2016-08-06 DIAGNOSIS — D519 Vitamin B12 deficiency anemia, unspecified: Secondary | ICD-10-CM | POA: Diagnosis present

## 2016-08-06 DIAGNOSIS — D500 Iron deficiency anemia secondary to blood loss (chronic): Secondary | ICD-10-CM | POA: Diagnosis present

## 2016-08-06 DIAGNOSIS — E039 Hypothyroidism, unspecified: Secondary | ICD-10-CM | POA: Diagnosis not present

## 2016-08-06 DIAGNOSIS — D508 Other iron deficiency anemias: Secondary | ICD-10-CM | POA: Diagnosis not present

## 2016-08-06 DIAGNOSIS — I480 Paroxysmal atrial fibrillation: Secondary | ICD-10-CM

## 2016-08-06 LAB — IRON AND TIBC
Iron: 14 ug/dL — ABNORMAL LOW (ref 45–182)
Saturation Ratios: 3 % — ABNORMAL LOW (ref 17.9–39.5)
TIBC: 452 ug/dL — AB (ref 250–450)
UIBC: 438 ug/dL

## 2016-08-06 LAB — TSH: TSH: 16.551 u[IU]/mL — ABNORMAL HIGH (ref 0.350–4.500)

## 2016-08-06 LAB — VITAMIN B12: Vitamin B-12: 77 pg/mL — ABNORMAL LOW (ref 180–914)

## 2016-08-06 LAB — ECHOCARDIOGRAM COMPLETE
Height: 70 in
Weight: 3600 oz

## 2016-08-06 LAB — MAGNESIUM: MAGNESIUM: 2 mg/dL (ref 1.7–2.4)

## 2016-08-06 LAB — T4, FREE: FREE T4: 0.81 ng/dL (ref 0.61–1.12)

## 2016-08-06 MED ORDER — CYANOCOBALAMIN 1000 MCG/ML IJ SOLN
1000.0000 ug | Freq: Once | INTRAMUSCULAR | Status: AC
  Administered 2016-08-06: 1000 ug via INTRAMUSCULAR
  Filled 2016-08-06: qty 1

## 2016-08-06 MED ORDER — ACETAMINOPHEN 650 MG RE SUPP: 650.0000 mg | Freq: Four times a day (QID) | RECTAL | Status: DC | PRN

## 2016-08-06 MED ORDER — DILTIAZEM HCL 60 MG PO TABS
60.0000 mg | ORAL_TABLET | Freq: Four times a day (QID) | ORAL | Status: DC
  Administered 2016-08-06 – 2016-08-07 (×3): 60 mg via ORAL
  Filled 2016-08-06 (×3): qty 1

## 2016-08-06 MED ORDER — DILTIAZEM HCL 30 MG PO TABS
30.0000 mg | ORAL_TABLET | Freq: Four times a day (QID) | ORAL | Status: DC
  Administered 2016-08-06 (×3): 30 mg via ORAL
  Filled 2016-08-06 (×4): qty 1

## 2016-08-06 MED ORDER — SODIUM CHLORIDE 0.9% FLUSH
3.0000 mL | Freq: Two times a day (BID) | INTRAVENOUS | Status: DC
  Administered 2016-08-06 – 2016-08-07 (×3): 3 mL via INTRAVENOUS

## 2016-08-06 MED ORDER — ENOXAPARIN SODIUM 40 MG/0.4ML ~~LOC~~ SOLN: 40.0000 mg | SUBCUTANEOUS | Status: DC

## 2016-08-06 MED ORDER — ACETAMINOPHEN 325 MG PO TABS: 650.0000 mg | ORAL_TABLET | Freq: Four times a day (QID) | ORAL | Status: DC | PRN

## 2016-08-06 MED ORDER — PERFLUTREN LIPID MICROSPHERE
1.0000 mL | INTRAVENOUS | Status: AC | PRN
  Administered 2016-08-06: 2 mL via INTRAVENOUS
  Filled 2016-08-06: qty 10

## 2016-08-06 MED ORDER — LEVOTHYROXINE SODIUM 25 MCG PO TABS
25.0000 ug | ORAL_TABLET | Freq: Every day | ORAL | Status: DC
  Administered 2016-08-06 – 2016-08-07 (×2): 25 ug via ORAL
  Filled 2016-08-06 (×2): qty 1

## 2016-08-06 MED ORDER — FERROUS SULFATE 325 (65 FE) MG PO TABS
325.0000 mg | ORAL_TABLET | Freq: Two times a day (BID) | ORAL | Status: DC
  Administered 2016-08-06 – 2016-08-07 (×2): 325 mg via ORAL
  Filled 2016-08-06 (×2): qty 1

## 2016-08-06 MED ORDER — ASPIRIN 325 MG PO TABS
325.0000 mg | ORAL_TABLET | Freq: Every day | ORAL | Status: DC
  Administered 2016-08-07: 325 mg via ORAL
  Filled 2016-08-06: qty 1

## 2016-08-06 NOTE — Care Management Note (Addendum)
Case Management Note  Patient Details  Name: Joel Ford MRN: [id number] Date of Birth: 04-05-1951  Subjective/Objective: Pt presented for Presyncope. PTA independent. Pt is from home with wife. Plan will be to return home once stable.                  Action/Plan: No Needs identified by CM at this time.   Expected Discharge Date:                  Expected Discharge Plan:  Home/Self Care  In-House Referral:  NA  Discharge planning Services  CM Consult  Post Acute Care Choice:  NA Choice offered to:  NA  DME Arranged:  N/A DME Agency:  NA  HH Arranged:  NA HH Agency:  NA  Status of Service:  Completed, signed off  If discussed at Long Length of Stay Meetings, dates discussed:    Additional Comments: 08-07-16 [address] Tomi BambergerBrenda Graves-Bigelow, RN,BSN [phone] CM did provide pt with 30 day free Xarelto Card. Pt uses [facility] and Xarelto is not available. CM did call [facility] [place] and medication is available. No further needs from CM at this time.    08-07-16 [address] Qadri Graves-Bigelow, [place] [phone] S/W CATHERINE @ [facility] # [phone]   1. ELIQUIS 2.5 MG BID  COVER- YES  CO-PAY- $ 45.00  TIER- 3 DRUG  PRIOR APPROVAL- NO   2. XARELTO  20 MG DAILY  COVER- YES  CO-PAY- $ 45.00  TIER- 3 DRUG  PRIOR APPROVAL- NO   CM will provide 30 day free card before d/c. No further needs from CM at this time.   08-06-16 [id number] Tomi BambergerBrenda Graves-Bigelow, RN, BSN [phone] Benefits check in process for Xarelto and Eliquis. CM will make pt aware of cost once completed.  Gala LewandowskyGraves-Bigelow, Garfield Coiner Kaye, RN 08/06/2016, 8:48 AM

## 2016-08-06 NOTE — Progress Notes (Signed)
PROGRESS NOTE                                                                                                                                                                                                             Patient Demographics:    Joel Ford, is a 66 y.o. male, DOB - 1951/06/21, VFI:[id number]  Admit date - 08/05/2016   Admitting Physician Alberteen Sam, MD  Outpatient Primary MD for the patient is Crawford Givens, MD  LOS - 0  Outpatient Specialists:None  Chief Complaint  Patient presents with  . Shortness of Breath  . Dizziness       Brief Narrative   66 year old male with no known past medical history presented to the ED with near syncope associated with lightheadedness and shortness of breath with exertion. He also had vague intermittent chest tightness. Denies recent illness or travel. In the ED he was in A. fib (rate controlled) Blood work showed mildly elevated creatinine, microcytic anemia. EKG showed A. fib with RVR (rate 121) and negative troponin. Patient given oral Cardizem 60 mg and placed on observation.    Subjective:   Denies dizziness today. Still in A. fib but heart rate mostly controlled occasionally goes up to 140s.   Assessment  & Plan :   Paroxysmal A. fib Risk factor includes hypothyroidism possible hypertension. TSH markedly elevated, low normal free T4. I will start him on Synthroid 25 g daily. Started on Cardizem, dose increased to 60 mg every 6 hours for better rate control.  2-D echo shows LVH with normal EF of 60-65%, no wall motion abnormalities. Also has mild aortic stenosis and mild-to-moderate MR with mildly elevated PA pressure.  His CHADS2vasc score is only 1. Placed on full dose aspirin. Check lipid panel in a.m. -Further recommendations per cardiology.  Near-syncope  Likely combination of paroxysmal A. fib, thyroidism and anemia.  Hypothyroidism TSH  of 16 with low normal free T4. Denies any associated symptoms. Started on low-dose Synthroid.  Severe microcytic anemia with iron deficiency and B12 deficiency -Both low iron stores and B12 (77) area denies weight loss or noticing dark stool. Reports very occasional alcohol use and occasional use of NSAIDs for pain. Started on iron supplement. Check stool for Hemoccult. Patient reports remote colonoscopy. Patient reports frequent numbness in his fingers. Given a dose of IM vitamin  B12. Check vitamin D level. Patient will need to be discharged on vitamin B12 supplements (that has daily by mouth or monthly injections).      Code Status : Full code  Family Communication  : None at bedside  Disposition Plan  : Home possibly tomorrow if heart rate better controlled.  Barriers For Discharge : Improving symptoms, 2-D echo  Consults  :  Cardiology  Procedures  : 2-D echo  DVT Prophylaxis  :  Lovenox   Lab Results  Component Value Date   PLT 148 (L) 08/05/2016    Antibiotics  :  None  Anti-infectives    None        Objective:   Vitals:   08/06/16 0216 08/06/16 0300 08/06/16 0604 08/06/16 1444  BP:  137/82 112/81 114/80  Pulse: 97 79  80  Resp:  18    Temp:  98 F (36.7 C)  97.8 F (36.6 C)  TempSrc:  Oral  Oral  SpO2:  98%  97%  Weight:  102.1 kg (225 lb)    Height:  5\' 10"  (1.778 m)      Wt Readings from Last 3 Encounters:  08/06/16 102.1 kg (225 lb)  12/15/13 109.8 kg (242 lb)  10/15/13 102.5 kg (226 lb)     Intake/Output Summary (Last 24 hours) at 08/06/16 1458 Last data filed at 08/06/16 1000  Gross per 24 hour  Intake                3 ml  Output                0 ml  Net                3 ml     Physical Exam  Gen: not in distress HEENT: Pallor present, moist mucosa, supple neck Chest: clear b/l, no added sounds CVS: S1 and S2 irregular, no murmurs rub or gallop GI: soft, NT, ND,  Musculoskeletal: warm, no edema     Data Review:     CBC  Recent Labs Lab 08/05/16 1435  WBC 6.6  HGB 10.6*  HCT 36.4*  PLT 148*  MCV 66.8*  MCH 19.4*  MCHC 29.1*  RDW 17.7*    Chemistries   Recent Labs Lab 08/05/16 1435 08/06/16 0332  NA 139  --   K 3.9  --   CL 105  --   CO2 24  --   GLUCOSE 110*  --   BUN 18  --   CREATININE 1.16  --   CALCIUM 8.7*  --   MG  --  2.0   ------------------------------------------------------------------------------------------------------------------ No results for input(s): CHOL, HDL, LDLCALC, TRIG, CHOLHDL, LDLDIRECT in the last 72 hours.  No results found for: HGBA1C ------------------------------------------------------------------------------------------------------------------  Recent Labs  08/06/16 0332  TSH 16.551*   ------------------------------------------------------------------------------------------------------------------  Recent Labs  08/06/16 0332  VITAMINB12 77*  TIBC 452*  IRON 14*    Coagulation profile No results for input(s): INR, PROTIME in the last 168 hours.  No results for input(s): DDIMER in the last 72 hours.  Cardiac Enzymes No results for input(s): CKMB, TROPONINI, MYOGLOBIN in the last 168 hours.  Invalid input(s): CK ------------------------------------------------------------------------------------------------------------------ No results found for: BNP  Inpatient Medications  Scheduled Meds: . [START ON 08/07/2016] aspirin  325 mg Oral Daily  . cyanocobalamin  1,000 mcg Intramuscular Once  . diltiazem  30 mg Oral Q6H  . enoxaparin (LOVENOX) injection  40 mg Subcutaneous Q24H  . ferrous sulfate  325 mg Oral BID WC  . levothyroxine  25 mcg Oral QAC breakfast  . sodium chloride flush  3 mL Intravenous Q12H   Continuous Infusions: PRN Meds:.acetaminophen **OR** acetaminophen, perflutren lipid microspheres (DEFINITY) IV suspension  Micro Results No results found for this or any previous visit (from the past 240  hour(s)).  Radiology Reports Dg Chest 2 View  Result Date: 08/05/2016 CLINICAL DATA:  Chest pressure EXAM: CHEST  2 VIEW COMPARISON:  09/14/2012 FINDINGS: Normal heart size and mediastinal contours. There is no edema, consolidation, effusion, or pneumothorax. Spondylosis with multilevel thoracic ankylosis. No acute osseous finding. IMPRESSION: No acute finding. Electronically Signed   By: Marnee SpringJonathon  Watts M.D.   On: 08/05/2016 15:00    Time Spent in minutes  25   Eddie NorthHUNGEL, Marah Park M.D on 08/06/2016 at 2:58 PM  Between 7am to 7pm - Pager - [phone]  After 7pm go to [email or web address] - password [email or web address]  [facility] -  Office  [phone]

## 2016-08-06 NOTE — Consult Note (Signed)
Cardiology Consult    Patient ID: Joel Ford MRN: [id number], DOB/AGE: [birth year]   Admit date: 08/05/2016 Date of Consult: 08/06/2016  Primary Physician: Crawford GivensGraham Duncan, MD Reason for Consult: Afib  Primary Cardiologist: New, has seen Dr. Kirke CorinArida in the past one time.  Requesting Provider: Dr. Gonzella Lexhungel   Patient Profile    Joel Ford is a 66 year old male with a past medical history of HLD and abnormal EKG. He presented with atrial fibrillation and cardiology was consulted.   History of Present Illness  Joel Ford started feeling SOB while he was walking his dog on Saturday 08/03/16. This was usual for him and he really didn't feel well for most of the weekend. On Monday, 08/05/16 he was at the grocery store with his wife and he was dyspneic with minimal activity. When he and his wife returned home, she took his BP and "it was high". Although she doesn't remember the exact reading. He asked his wide to take him to the hospital.   His EKG showed atrial fibrillation with a rate of 121. He was started on po diltiazem and his rates have improved. He does not have a history of heart disease, denies exertional chest pain or SOB. He worked as a Oncologistfire captain for 34 years.   He does not smoke or drink alcohol.   Past Medical History   Past Medical History:  Diagnosis Date  . ABNORMAL ELECTROCARDIOGRAM 07/04/2009   Qualifier: Diagnosis of  By: Jillyn HiddenBean FNP, Mcarthur RossettiBillie-Lynn Daniels   . Gout   . HYPERGLYCEMIA 07/27/2009   Qualifier: Diagnosis of  By: Hetty ElySchaller MD, Franne Gripobert Neal   . HYPERLIPIDEMIA 07/04/2009   Qualifier: Diagnosis of  By: Jillyn HiddenBean FNP, Mcarthur RossettiBillie-Lynn Daniels   . Left knee DJD    OA  . RENAL CALCULUS 02/15/2008   Qualifier: Diagnosis of  By: Jillyn HiddenBean FNP, Mcarthur RossettiBillie-Lynn Daniels   . S/P total knee replacement 08/07/2009   right    Past Surgical History:  Procedure Laterality Date  . APPENDECTOMY     age 66  . CARDIAC CATHETERIZATION  01/23/2005   Normal  . FINGER SURGERY     RIGHT INDEX,  REATTACHED  . orthoscopy     Right and left knee  . TOTAL KNEE ARTHROPLASTY  2011   Right  . TOTAL KNEE ARTHROPLASTY Left 09/21/2012   Dr Thurston HoleWainer  . TOTAL KNEE ARTHROPLASTY Left 09/21/2012   Procedure: TOTAL KNEE ARTHROPLASTY;  Surgeon: Nilda Simmerobert A Wainer, MD;  Location: [facility] OR;  Service: Orthopedics;  Laterality: Left;     Allergies  No Known Allergies  Inpatient Medications    . [START ON 08/07/2016] aspirin  325 mg Oral Daily  . cyanocobalamin  1,000 mcg Intramuscular Once  . diltiazem  30 mg Oral Q6H  . enoxaparin (LOVENOX) injection  40 mg Subcutaneous Q24H  . ferrous sulfate  325 mg Oral BID WC  . levothyroxine  25 mcg Oral QAC breakfast  . sodium chloride flush  3 mL Intravenous Q12H    Family History    Family History  Problem Relation Age of Onset  . Stroke Father   . Colon cancer Neg Hx   . Prostate cancer Neg Hx     Social History    Social History   Social History  . Marital status: Married    Spouse name: N/A  . Number of children: 1  . Years of education: N/A   Occupational History  . 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 Smoking status: Never Smoker  . Smokeless tobacco: Current User    Types: Chew  . Alcohol use No  . Drug use: No  . Sexual activity: Not on file   Other Topics Concern  . Not on file   Social History Narrative   Firefighter.     [employer].     Married 1977     Review of Systems    General:  No chills, fever, night sweats or weight changes.  Cardiovascular:  No chest pain, dyspnea on exertion, edema, orthopnea, palpitations, paroxysmal nocturnal dyspnea. Dermatological: No rash, lesions/masses Respiratory: No cough, dyspnea Urologic: No hematuria, dysuria Abdominal:   No nausea, vomiting, diarrhea, bright red blood per rectum, melena, or hematemesis Neurologic:  No visual changes, wkns, changes in mental status. All other systems reviewed and are otherwise negative  except as noted above.  Physical Exam    Blood pressure 112/81, pulse 79, temperature 98 F (36.7 C), temperature source Oral, resp. rate 18, height 5\' 10"  (1.778 m), weight 225 lb (102.1 kg), SpO2 98 %.  General: Pleasant, NAD Psych: Normal affect. Neuro: Alert and oriented X 3. Moves all extremities spontaneously. HEENT: Normal  Neck: Supple without bruits or JVD. Lungs:  Resp regular and unlabored, CTA. Heart: IRRR no s3, s4, or murmurs. Abdomen: Soft, non-tender, non-distended, BS + x 4.  Extremities: No clubbing, cyanosis or edema. DP/PT/Radials 2+ and equal bilaterally.  Labs    Troponin [facility] of Care Test)  Recent Labs  08/05/16 1452  TROPIPOC 0.02   No results for input(s): CKTOTAL, CKMB, TROPONINI in the last 72 hours. Lab Results  Component Value Date   WBC 6.6 08/05/2016   HGB 10.6 (L) 08/05/2016   HCT 36.4 (L) 08/05/2016   MCV 66.8 (L) 08/05/2016   PLT 148 (L) 08/05/2016    Recent Labs Lab 08/05/16 1435  NA 139  K 3.9  CL 105  CO2 24  BUN 18  CREATININE 1.16  CALCIUM 8.7*  GLUCOSE 110*   Lab Results  Component Value Date   CHOL 235 (H) 07/04/2009   HDL 32.40 (L) 07/04/2009   TRIG 161.0 (H) 07/04/2009   No results found for: [facility]   Radiology Studies    Dg Chest 2 View  Result Date: 08/05/2016 CLINICAL DATA:  Chest pressure EXAM: CHEST  2 VIEW COMPARISON:  09/14/2012 FINDINGS: Normal heart size and mediastinal contours. There is no edema, consolidation, effusion, or pneumothorax. Spondylosis with multilevel thoracic ankylosis. No acute osseous finding. IMPRESSION: No acute finding. Electronically Signed   By: Marnee Spring M.D.   On: 08/05/2016 15:00    EKG & Cardiac Imaging    EKG: Afib  Echocardiogram: pending   Assessment & Plan    1. New onset atrial fibrillation: Patient was initially symptomatic with palpitations, SOB and activity intolerance. He feels much better with the addition of Cardizem, which has slowed his rate down to  the 90's.   Will transition to long acting Cardizem 240mg  daily.   This patients CHA2DS2-VASc Score and unadjusted Ischemic Stroke Rate (% per year) is equal to 2.2 % stroke rate/year from a score of 2 Above score calculated as 1 point each if present [CHF, HTN, DM, Vascular=MI/PAD/Aortic Plaque, Age if 65-74, or Male], 2 points each if present [Age > 75, or Stroke/TIA/TE]  CHA2DS2-VASc is only 2, but I would still start anti-coagulation with a DOAC (pending no valvular heart disease on Echo).   Would plan for outpatient  TEE/DCCV in one month.  Also will plan for an outpatient stress test, after cardioversion.   2. HTN: Well controlled.   3. Hypothyroidism: TSH is 16, started on Synthroid by primary team.      Signed, Little Ishikawa, NP 08/06/2016, 1:44 PM Pager: [phone]   I have seen, examined and evaluated the patient this PM along with Suzzette Righter, NP.  After reviewing all the available data and chart, we discussed the patients laboratory, study & physical findings as well as symptoms in detail. I agree with her findings, examination as well as impression recommendations as per our discussion.    Patient presented with what looks like new onset A. fib rate initially controlled rate with oral diltiazem. I would convert this to daily diltiazem. He feels much better now that his heart rate is slowed. He is not actively having any heart failure symptoms.  His echo is now been completed showing EF of 60-65% with very mild aortic stenosis. No regional wall motion abnormalities. Mildly elevated PA pressures. Mild to moderate mitral regurgitation.  With a relatively normal echocardiogram and no overt heart failure I think he's probably finding be discharged on oral calcium channel blocker. We have recommended anticoagulation with a DOAC (either ELIQUIS or Xarelto) which would then allow [place] to potentially cardiovert him if he remains in A. fib after about a month.  Once he is no longer in  A. fib, we can consider an outpatient stress test. I will see him in follow-up to determine the timing of this.   Bryan Lemma, M.D., M.S. Interventional Cardiologist   Pager # [phone] Phone # [phone] [address] [place], [place] [address]

## 2016-08-06 NOTE — Progress Notes (Signed)
  Echocardiogram 2D Echocardiogram with Definity has been performed.  Nolon RodBrown, Tony 08/06/2016, 2:46 PM

## 2016-08-07 DIAGNOSIS — D508 Other iron deficiency anemias: Secondary | ICD-10-CM | POA: Diagnosis not present

## 2016-08-07 DIAGNOSIS — D519 Vitamin B12 deficiency anemia, unspecified: Secondary | ICD-10-CM

## 2016-08-07 DIAGNOSIS — E039 Hypothyroidism, unspecified: Secondary | ICD-10-CM

## 2016-08-07 DIAGNOSIS — I480 Paroxysmal atrial fibrillation: Secondary | ICD-10-CM | POA: Diagnosis not present

## 2016-08-07 DIAGNOSIS — I4891 Unspecified atrial fibrillation: Secondary | ICD-10-CM | POA: Diagnosis not present

## 2016-08-07 LAB — VITAMIN D 25 HYDROXY (VIT D DEFICIENCY, FRACTURES): Vit D, 25-Hydroxy: 22.3 ng/mL — ABNORMAL LOW (ref 30.0–100.0)

## 2016-08-07 MED ORDER — DILTIAZEM HCL ER COATED BEADS 240 MG PO CP24: 240.0000 mg | ORAL_CAPSULE | Freq: Every day | ORAL | 0 refills | Status: DC

## 2016-08-07 MED ORDER — LEVOTHYROXINE SODIUM 25 MCG PO TABS: 25.0000 ug | ORAL_TABLET | Freq: Every day | ORAL | 0 refills | Status: DC

## 2016-08-07 MED ORDER — FERROUS SULFATE 325 (65 FE) MG PO TABS: 325.0000 mg | ORAL_TABLET | Freq: Every day | ORAL | 0 refills | Status: DC

## 2016-08-07 MED ORDER — RIVAROXABAN 20 MG PO TABS: 20.0000 mg | ORAL_TABLET | Freq: Every day | ORAL | 0 refills | Status: DC

## 2016-08-07 MED ORDER — METOPROLOL TARTRATE 25 MG PO TABS: 12.5000 mg | ORAL_TABLET | Freq: Two times a day (BID) | ORAL | 0 refills | Status: DC

## 2016-08-07 MED ORDER — DILTIAZEM HCL ER COATED BEADS 240 MG PO CP24
240.0000 mg | ORAL_CAPSULE | Freq: Every day | ORAL | Status: DC
Start: 2016-08-07 — End: 2016-08-07
  Administered 2016-08-07: 240 mg via ORAL
  Filled 2016-08-07: qty 1

## 2016-08-07 NOTE — Progress Notes (Signed)
Progress Note  Patient Name: Joel Ford Date of Encounter: 08/07/2016  Primary Cardiologist: Dr. Kirke CorinArida  Subjective   Feels great, denies chest pain and palpitations.   Inpatient Medications    Scheduled Meds: . aspirin  325 mg Oral Daily  . diltiazem  240 mg Oral Daily  . enoxaparin (LOVENOX) injection  40 mg Subcutaneous Q24H  . ferrous sulfate  325 mg Oral BID WC  . levothyroxine  25 mcg Oral QAC breakfast  . sodium chloride flush  3 mL Intravenous Q12H   Continuous Infusions:  PRN Meds: acetaminophen **OR** acetaminophen   Vital Signs    Vitals:   08/06/16 1444 08/06/16 2242 08/06/16 2339 08/07/16 0419  BP: 114/80  (!) 90/54 110/75  Pulse: 80   71  Resp:    16  Temp: 97.8 F (36.6 C) 98.8 F (37.1 C)  98.4 F (36.9 C)  TempSrc: Oral Oral  Axillary  SpO2: 97% 98%  100%  Weight:    220 lb 9.6 oz (100.1 kg)  Height:        Intake/Output Summary (Last 24 hours) at 08/07/16 1057 Last data filed at 08/07/16 0800  Gross per 24 hour  Intake              240 ml  Output                0 ml  Net              240 ml   Filed Weights   08/05/16 1423 08/06/16 0300 08/07/16 0419  Weight: 240 lb (108.9 kg) 225 lb (102.1 kg) 220 lb 9.6 oz (100.1 kg)    Telemetry    Afib, rates in the 100's- Personally Reviewed    Physical Exam   GEN: No acute distress.   Neck: No JVD Cardiac: IRRR, no murmurs, rubs, or gallops.  Respiratory: Clear to auscultation bilaterally. GI: Soft, nontender, non-distended  MS: No edema; No deformity. Neuro:  Nonfocal  Psych: Normal affect   Labs    Chemistry Recent Labs Lab 08/05/16 1435  NA 139  K 3.9  CL 105  CO2 24  GLUCOSE 110*  BUN 18  CREATININE 1.16  CALCIUM 8.7*  GFRNONAA >60  GFRAA >60  ANIONGAP 10     Hematology Recent Labs Lab 08/05/16 1435  WBC 6.6  RBC 5.45  HGB 10.6*  HCT 36.4*  MCV 66.8*  MCH 19.4*  MCHC 29.1*  RDW 17.7*  PLT 148*    Cardiac EnzymesNo results for input(s): TROPONINI  in the last 168 hours.  Recent Labs Lab 08/05/16 1452  TROPIPOC 0.02     BNPNo results for input(s): BNP, PROBNP in the last 168 hours.   DDimer No results for input(s): DDIMER in the last 168 hours.   Radiology    Dg Chest 2 View  Result Date: 08/05/2016 CLINICAL DATA:  Chest pressure EXAM: CHEST  2 VIEW COMPARISON:  09/14/2012 FINDINGS: Normal heart size and mediastinal contours. There is no edema, consolidation, effusion, or pneumothorax. Spondylosis with multilevel thoracic ankylosis. No acute osseous finding. IMPRESSION: No acute finding. Electronically Signed   By: Marnee SpringJonathon  Watts M.D.   On: 08/05/2016 15:00    Cardiac Studies   Transthoracic Echocardiography 08/06/16 Study Conclusions  - Left ventricle: The cavity size was normal. There was mild   concentric hypertrophy. Systolic function was normal. The   estimated ejection fraction was in the range of 60% to 65%. Wall   motion was  normal; there were no regional wall motion   abnormalities. - Aortic valve: There was very mild stenosis. There was trivial   regurgitation. Valve area (VTI): 2.13 cm^2. Valve area (Vmax):   1.81 cm^2. Valve area (Vmean): 1.94 cm^2. - Mitral valve: There was mild to moderate regurgitation directed   centrally. - Left atrium: The atrium was mildly to moderately dilated. - Pulmonary arteries: Systolic pressure was mildly increased. PA   peak pressure: 37 mm Hg (S).  Patient Profile     66 y.o. male with a past medical history of HLD and abnormal EKG. He presented with atrial fibrillation and cardiology was consulted.   Assessment & Plan    1. New onset atrial fibrillation: Patient was initially symptomatic with palpitations, SOB and activity intolerance. He feels much better with the addition of Cardizem, which has slowed his rate down. He still has some rates in the 100's.   Will transition to long acting Cardizem 240mg  daily. Would add metoprolol 12.5mg  BID.   This patients  CHA2DS2-VASc Score and unadjusted Ischemic Stroke Rate (% per year) is equal to 2.2 % stroke rate/year from a score of 2 Above score calculated as 1 point each if present [CHF, HTN, DM, Vascular=MI/PAD/Aortic Plaque, Age if 65-74, or Male], 2 points each if present [Age > 75, or Stroke/TIA/TE]  CHA2DS2-VASc is only 2, but I would still start anti-coagulation with a DOAC. Case manager working on cost of Xarelto vs. Eliquis.   Would plan for outpatient TEE/DCCV in one month.  Also will plan for an outpatient stress test, after cardioversion.   2. HTN: Well controlled.   3. Hypothyroidism: TSH is 16, started on Synthroid by primary team.    4. Severe microcytic anemia with iron deficiency and B12 deficiency -Both low iron stores and B12 (77) area denies weight loss or noticing dark stool.  Started on iron supplement.  Patient reports frequent numbness in his fingers. Given a dose of IM vitamin B12.  Patient will need to be discharged on vitamin B12 supplements (that has daily by mouth or monthly injections) Per IM Team.    Signed, Little Ishikawa, NP  08/07/2016, 10:57 AM     New onset A. fib, now rate controlled as noted above. I think the best option in order to allow [place] to potentially consider cardioversion in roughly 4 weeks would be to start him on DOAC that would be on for minimum of 2 months. We can then determine the need for long-term prophylaxis. There is concern about iron deficiency anemia, severe therefore we need to monitor his hemoglobin levels. Best case scenario would be that he spontaneously converted back to sinus rhythm as of which would preclude the need for and to a DOAC.  Plan would then be for outpatient stress test evaluation once we have restored sinus rhythm.   He is set to be discharged today on oral medications as discussed.   Bryan Lemma, M.D., M.S. Interventional Cardiologist   Pager # [phone] Phone # [phone] [address] [place], [place] [address]

## 2016-08-07 NOTE — Discharge Instructions (Signed)

## 2016-08-07 NOTE — Discharge Summary (Signed)
Physician Discharge Summary  Ossie Beltran Stfleur ZOX:[id number] DOB: [birth year] DOA: 08/05/2016  PCP: Crawford Givens, MD  Admit date: 08/05/2016 Discharge date: 08/07/2016   Recommendations for Outpatient Follow-Up:   follow up B12 def Cbc 1 week re Hgb (started on xarelto) TSH 6 weeks Iron panel to follow anemia Vit D also slightly low- consider replacement Follow up with cards for possible TEE/cardioversion and outpatient stress test    Discharge Diagnosis:   Principal Problem:   Paroxysmal atrial fibrillation (HCC) Active Problems:   New onset atrial fibrillation (HCC)   B12 deficiency anemia   Anemia, iron deficiency   Hypothyroidism   Discharge disposition:  Home  Discharge Condition: Improved.  Diet recommendation: Low sodium, heart healthy.    Wound care: None.   History of Present Illness:   [facility] is a 66 y.o. male with no significant past medical history who presents with pre-syncope.  The patient was in his usual state of health until Saturday 2 days ago when he had sudden onset light-headedness, pre-syncope, and shortness of breath with exertion.  Yesterday his wife noticed he seemed "very winded from just walking down the aisle in the grocery store" and today he felt like he had a vague intermittent chest tightness, and so he came to the ER.    Hospital Course by Problem:   1. New onset atrial fibrillation- parox:  -long acting Cardizem 240mg  daily and cardiology added metoprolol 12.5mg  BID.  -CHA2DS2-VASc Score 2 but cards recommends starting anti-coagulation with a DOAC for outpatient TEE/DCCV in one month. plans for an outpatient stress test, after cardioversion.  -xarelto started  2. HTN: Well controlled.   3. Hypothyroidism: TSH is 16, started on Synthroid- will need outpatient follow up  4. Severe microcytic anemia with iron deficiency and B12 deficiency -Both low iron stores and B12 (77) area denies weight loss or noticing dark  stool.  Started on iron supplement.   Given a dose of IM vitamin B12 and discharged on PO daily replacement- will need to be followed as an outpatient    Medical Consultants:    cards   Discharge Exam:   Vitals:   08/06/16 2339 08/07/16 0419  BP: (!) 90/54 110/75  Pulse:  71  Resp:  16  Temp:  98.4 F (36.9 C)   Vitals:   08/06/16 1444 08/06/16 2242 08/06/16 2339 08/07/16 0419  BP: 114/80  (!) 90/54 110/75  Pulse: 80   71  Resp:    16  Temp: 97.8 F (36.6 C) 98.8 F (37.1 C)  98.4 F (36.9 C)  TempSrc: Oral Oral  Axillary  SpO2: 97% 98%  100%  Weight:    100.1 kg (220 lb 9.6 oz)  Height:        Gen:  NAD- anxious to be discharged    The results of significant diagnostics from this hospitalization (including imaging, microbiology, ancillary and laboratory) are listed below for reference.     Procedures and Diagnostic Studies:   Dg Chest 2 View  Result Date: 08/05/2016 CLINICAL DATA:  Chest pressure EXAM: CHEST  2 VIEW COMPARISON:  09/14/2012 FINDINGS: Normal heart size and mediastinal contours. There is no edema, consolidation, effusion, or pneumothorax. Spondylosis with multilevel thoracic ankylosis. No acute osseous finding. IMPRESSION: No acute finding. Electronically Signed   By: Marnee Spring M.D.   On: 08/05/2016 15:00     Labs:   Basic Metabolic Panel:  Recent Labs Lab 08/05/16 1435 08/06/16 0332  NA 139  --  K 3.9  --   CL 105  --   CO2 24  --   GLUCOSE 110*  --   BUN 18  --   CREATININE 1.16  --   CALCIUM 8.7*  --   MG  --  2.0   GFR Estimated Creatinine Clearance: 75.3 mL/min (by C-G formula based on SCr of 1.16 mg/dL). Liver Function Tests: No results for input(s): AST, ALT, ALKPHOS, BILITOT, PROT, ALBUMIN in the last 168 hours. No results for input(s): LIPASE, AMYLASE in the last 168 hours. No results for input(s): AMMONIA in the last 168 hours. Coagulation profile No results for input(s): INR, PROTIME in the last 168  hours.  CBC:  Recent Labs Lab 08/05/16 1435  WBC 6.6  HGB 10.6*  HCT 36.4*  MCV 66.8*  PLT 148*   Cardiac Enzymes: No results for input(s): CKTOTAL, CKMB, CKMBINDEX, TROPONINI in the last 168 hours. BNP: Invalid input(s): POCBNP CBG: No results for input(s): GLUCAP in the last 168 hours. D-Dimer No results for input(s): DDIMER in the last 72 hours. Hgb A1c No results for input(s): HGBA1C in the last 72 hours. Lipid Profile No results for input(s): CHOL, HDL, LDLCALC, TRIG, CHOLHDL, LDLDIRECT in the last 72 hours. Thyroid function studies  Recent Labs  08/06/16 0332  TSH 16.551*   Anemia work up  Recent Labs  08/06/16 0332  VITAMINB12 77*  TIBC 452*  IRON 14*   Microbiology No results found for this or any previous visit (from the past 240 hour(s)).   Discharge Instructions:   Discharge Instructions    Diet - low sodium heart healthy    Complete by:  As directed    Discharge instructions    Complete by:  As directed    Would take OTC B12 1000 mcg/day as well as Fe CBC 1 week with PCP   Increase activity slowly    Complete by:  As directed      Allergies as of 08/07/2016   No Known Allergies     Medication List    STOP taking these medications   ibuprofen 200 MG tablet Commonly known as:  ADVIL,MOTRIN     TAKE these medications   diltiazem 240 MG 24 hr capsule Commonly known as:  CARDIZEM CD Take 1 capsule (240 mg total) by mouth daily. Start taking on:  08/08/2016   ferrous sulfate 325 (65 FE) MG tablet Commonly known as:  FERROUSUL Take 1 tablet (325 mg total) by mouth daily with breakfast.   levothyroxine 25 MCG tablet Commonly known as:  SYNTHROID, LEVOTHROID Take 1 tablet (25 mcg total) by mouth daily before breakfast. Start taking on:  08/08/2016   metoprolol tartrate 25 MG tablet Commonly known as:  LOPRESSOR Take 0.5 tablets (12.5 mg total) by mouth 2 (two) times daily.   rivaroxaban 20 MG Tabs tablet Commonly known as:   XARELTO Take 1 tablet (20 mg total) by mouth daily with supper.      Follow-up Information    Crawford Givens, MD Follow up in 1 week(s).   Specialty:  Family Medicine Why:  for follow up of labs-- CBC 1 week (TSH 6 weeks); HR and BP check at next visit Contact information: [address] [place] [address] [phone]        Bryan Lemma, MD Follow up in 4 week(s).   Specialty:  Cardiology Contact information: [address] [place] [address] [phone]  Time coordinating discharge: 35 min  Signed:  Desira Alessandrini U Sencere Symonette   [facility] 08/07/2016, 12:00 PM

## 2016-08-07 NOTE — Care Management Obs Status (Signed)
MEDICARE OBSERVATION STATUS NOTIFICATION   Patient Details  Name: Marcy PanningLee Roy Dopson MRN: [id number] Date of Birth: 10-28-1950   Medicare Observation Status Notification Given:  Yes    Gala LewandowskyGraves-Bigelow, Amaris Garrette Kaye, RN 08/07/2016, 8:53 AM

## 2016-08-27 ENCOUNTER — Encounter: Payer: Self-pay | Admitting: Cardiovascular Disease

## 2016-08-27 ENCOUNTER — Ambulatory Visit (INDEPENDENT_AMBULATORY_CARE_PROVIDER_SITE_OTHER): Payer: Medicare Other | Admitting: Cardiovascular Disease

## 2016-08-27 VITALS — BP 122/60 | HR 70 | Ht 71.0 in | Wt 224.5 lb

## 2016-08-27 DIAGNOSIS — I359 Nonrheumatic aortic valve disorder, unspecified: Secondary | ICD-10-CM | POA: Diagnosis not present

## 2016-08-27 DIAGNOSIS — R0602 Shortness of breath: Secondary | ICD-10-CM | POA: Diagnosis not present

## 2016-08-27 DIAGNOSIS — I480 Paroxysmal atrial fibrillation: Secondary | ICD-10-CM

## 2016-08-27 DIAGNOSIS — R9431 Abnormal electrocardiogram [ECG] [EKG]: Secondary | ICD-10-CM | POA: Diagnosis not present

## 2016-08-27 MED ORDER — COLCHICINE 0.6 MG PO TABS: 0.6000 mg | ORAL_TABLET | Freq: Every day | ORAL | 0 refills | Status: DC

## 2016-08-27 NOTE — Progress Notes (Signed)
Cardiology Office Note   Date:  08/27/2016   ID:  [facility], [facility] Ford 1950/12/30, MRN [id number]  PCP:  Laurance Flatten, NP  Cardiologist:   Lorine Bears, MD   Chief Complaint  Patient presents with  . other    Patient was in the hospital for Afib. Meds reviewed verbally with patient.       History of Present Illness: [facility] Ford is a 65 y.o. male who presents for a follow-up visit regarding recently diagnosed atrial fibrillation. He has known history of hyperlipidemia and abnormal EKG and was seen by me in 2015. He presented recently to [facility] with shortness of breath and elevated blood pressure. EKG showed atrial fibrillation with rapid ventricular response. Echocardiogram showed normal LV systolic function, mild aortic stenosis, mild to moderate mitral regurgitation, mild to moderately dilated left atrium and mild pulmonary hypertension. He was treated with diltiazem and was started on anticoagulation with Xarelto. He has been doing well since hospital discharge with improved shortness of breath and no chest pain. No palpitations. The patient was noted to have iron deficiency anemia and was started on furosemide for. He is supposed to see a new primary care physician next week given that he has not been seen by a physician in many years.  Past Medical History:  Diagnosis Date  . ABNORMAL ELECTROCARDIOGRAM 07/04/2009   Qualifier: Diagnosis of  By: Jillyn Hidden FNP, Mcarthur Rossetti   . Gout   . HYPERGLYCEMIA 07/27/2009   Qualifier: Diagnosis of  By: Hetty Ely MD, Franne Grip   . HYPERLIPIDEMIA 07/04/2009   Qualifier: Diagnosis of  By: Jillyn Hidden FNP, Mcarthur Rossetti   . Left knee DJD    OA  . RENAL CALCULUS 02/15/2008   Qualifier: Diagnosis of  By: Jillyn Hidden FNP, Mcarthur Rossetti   . S/P total knee replacement 08/07/2009   right    Past Surgical History:  Procedure Laterality Date  . APPENDECTOMY     age 72  . CARDIAC CATHETERIZATION  01/23/2005   Normal  . FINGER SURGERY     RIGHT  INDEX, REATTACHED  . orthoscopy     Right and left knee  . TOTAL KNEE ARTHROPLASTY  2011   Right  . TOTAL KNEE ARTHROPLASTY Left 09/21/2012   Dr Thurston Hole  . TOTAL KNEE ARTHROPLASTY Left 09/21/2012   Procedure: TOTAL KNEE ARTHROPLASTY;  Surgeon: Nilda Simmer, MD;  Location: [facility];  Service: Orthopedics;  Laterality: Left;     Current Outpatient Prescriptions  Medication Sig Dispense Refill  . diltiazem (CARDIZEM CD) 240 MG 24 hr capsule Take 1 capsule (240 mg total) by mouth daily. 30 capsule 0  . ferrous sulfate (FERROUSUL) 325 (65 FE) MG tablet Take 1 tablet (325 mg total) by mouth daily with breakfast. 30 tablet 0  . levothyroxine (SYNTHROID, LEVOTHROID) 25 MCG tablet Take 1 tablet (25 mcg total) by mouth daily before breakfast. 30 tablet 0  . metoprolol tartrate (LOPRESSOR) 25 MG tablet Take 0.5 tablets (12.5 mg total) by mouth 2 (two) times daily. 30 tablet 0  . rivaroxaban (XARELTO) 20 MG TABS tablet Take 1 tablet (20 mg total) by mouth daily with supper. 30 tablet 0  . colchicine 0.6 MG tablet Take 1 tablet (0.6 mg total) by mouth daily. 32 tablet 0   No current facility-administered medications for this visit.     Allergies:   Patient has no known allergies.    Social History:  The patient  reports that he has never smoked. His smokeless tobacco  use includes Chew. He reports that he does not drink alcohol or use drugs.   Family History:  The patient's family history includes Stroke in his father.    ROS:  Please see the history of present illness.   Otherwise, review of systems are positive for none.   All other systems are reviewed and negative.    PHYSICAL EXAM: VS:  BP 122/60 (BP Location: Left Arm, Patient Position: Sitting, Cuff Size: Normal)   Pulse 70   Ht 5\' 11"  (1.803 m)   Wt 224 lb 8 oz (101.8 kg)   BMI 31.31 kg/m  , BMI Body mass index is 31.31 kg/m. GEN: Well nourished, well developed, in no acute distress  HEENT: normal  Neck: no JVD, carotid bruits, or  masses Cardiac: RRR; no  rubs, or gallops,no edema . 2/6 systolic ejection murmur in the aortic area and 2/6 holosystolic murmur at the apex Respiratory:  clear to auscultation bilaterally, normal work of breathing GI: soft, nontender, nondistended, + BS MS: no deformity or atrophy  Skin: warm and dry, no rash Neuro:  Strength and sensation are intact Psych: euthymic mood, full affect   EKG:  EKG is ordered today. The ekg ordered today demonstrates normal sinus rhythm with anterolateral T wave changes suggestive of ischemia.   Recent Labs: 08/05/2016: BUN 18; Creatinine, Ser 1.16; Hemoglobin 10.6; Platelets 148; Potassium 3.9; Sodium 139 08/06/2016: Magnesium 2.0; TSH 16.551    Lipid Panel    Component Value Date/Time   CHOL 235 (H) 07/04/2009 1014   TRIG 161.0 (H) 07/04/2009 1014   HDL 32.40 (L) 07/04/2009 1014   CHOLHDL 7 07/04/2009 1014   VLDL 32.2 07/04/2009 1014   LDLDIRECT 179.9 07/04/2009 1014      Wt Readings from Last 3 Encounters:  08/27/16 224 lb 8 oz (101.8 kg)  08/07/16 220 lb 9.6 oz (100.1 kg)  12/15/13 242 lb (109.8 kg)       No flowsheet data found.    ASSESSMENT AND PLAN:  1.  Paroxysmal atrial fibrillation: The patient is noted to be back in normal sinus rhythm today but given his valvular heart disease and dilated left atrium, he is at risk for recurrent atrial fibrillation. Continue treatment with diltiazem and small dose metoprolol. Continue long-term anticoagulation.  2. Exertional dyspnea with abnormal EKG suggestive of anterolateral ischemia: I requested a treadmill Myoview for evaluation. Given his abnormal EKG, a treadmill stress test alone is not sufficient.  3. Gout affecting his right foot: Similar to what he had in the past. I agreed to give him short-term supply of colchicine until he sees his primary care physician.  4. Iron deficiency anemia: I recommend referral to GI for colonoscopy especially that he is going to stay on long-term  anticoagulation.  5. Mild aortic stenosis and mild to moderate mitral regurgitation: Would consider repeat echocardiogram in one to 2 years.    Disposition:   FU with me in 1 month  Signed,  Lorine BearsMuhammad Delani Kohli, MD  08/27/2016 3:23 PM    [facility]

## 2016-08-27 NOTE — Patient Instructions (Addendum)
Medication Instructions:  Your physician has recommended you make the following change in your medication:  START taking colchicine 0.6mg  twice daily for two days then once daily   Your physician has requested that you have a lexiscan myoview. For further information please visit [email or web address] Please follow instruction sheet, as given.  [facility] MYOVIEW  Your caregiver has ordered a Stress Test with nuclear imaging. The purpose of this test is to evaluate the blood supply to your heart muscle. This procedure is referred to as a "Non-Invasive Stress Test." This is because other than having an IV started in your vein, nothing is inserted or "invades" your body. Cardiac stress tests are done to find areas of poor blood flow to the heart by determining the extent of coronary artery disease (CAD). Some patients exercise on a treadmill, which naturally increases the blood flow to your heart, while others who are  unable to walk on a treadmill due to physical limitations have a pharmacologic/chemical stress agent called Lexiscan . This medicine will mimic walking on a treadmill by temporarily increasing your coronary blood flow.   Please note: these test may take anywhere between 2-4 hours to complete  PLEASE REPORT TO [facility] MEDICAL MALL ENTRANCE  THE VOLUNTEERS AT THE FIRST DESK WILL DIRECT YOU WHERE TO GO  Date of Procedure:__Wed, February 28______  Arrival Time for Procedure:___7:15am______  Instructions regarding medication:   _xx___:  Hold diltiazem and metoprolol the morning of your test.   PLEASE NOTIFY THE OFFICE AT LEAST 24 HOURS IN ADVANCE IF YOU ARE UNABLE TO KEEP YOUR APPOINTMENT.  [phone] AND  PLEASE NOTIFY NUCLEAR MEDICINE AT [facility] AT LEAST 24 HOURS IN ADVANCE IF YOU ARE UNABLE TO KEEP YOUR APPOINTMENT. [phone]  How to prepare for your Myoview test:  1. Do not eat or drink after midnight 2. No caffeine for 24 hours prior to test 3. No smoking 24 hours prior to  test. 4. Your medication may be taken with water.  If your doctor stopped a medication because of this test, do not take that medication. 5. Ladies, please do not wear dresses.  Skirts or pants are appropriate. Please wear a short sleeve shirt. 6. No perfume, cologne or lotion. 7. Wear comfortable walking shoes. No heels!           Follow-Up: Your physician recommends that you schedule a follow-up appointment in: one month with Dr. Kirke Corin.    Any Other Special Instructions Will Be Listed Below (If Applicable).     If you need a refill on your cardiac medications before your next appointment, please call your pharmacy.  Cardiac Nuclear Scanning A cardiac nuclear scan is used to check your heart for problems, such as the following:  A portion of the heart is not getting enough blood.  Part of the heart muscle has died, which happens with a heart attack.  The heart wall is not working normally.  In this test, a radioactive dye (tracer) is injected into your bloodstream. After the tracer has traveled to your heart, a scanning device is used to measure how much of the tracer is absorbed by or distributed to various areas of your heart. LET [facility] CARE PROVIDER KNOW ABOUT:  Any allergies you have.  All medicines you are taking, including vitamins, herbs, eye drops, creams, and over-the-counter medicines.  Previous problems you or members of your family have had with the use of anesthetics.  Any blood disorders you have.  Previous surgeries you have had.  Medical  conditions you have.  RISKS AND COMPLICATIONS Generally, this is a safe procedure. However, as with any procedure, problems can occur. Possible problems include:   Serious chest pain.  Rapid heartbeat.  Sensation of warmth in your chest. This usually passes quickly. BEFORE THE PROCEDURE Ask your health care provider about changing or stopping your regular medicines. PROCEDURE This procedure is usually  done at a hospital and takes 2-4 hours.  An IV tube is inserted into one of your veins.  Your health care provider will inject a small amount of radioactive tracer through the tube.  You will then wait for 20-40 minutes while the tracer travels through your bloodstream.  You will lie down on an exam table so images of your heart can be taken. Images will be taken for about 15-20 minutes.  You will exercise on a treadmill or stationary bike. While you exercise, your heart activity will be monitored with an electrocardiogram (ECG), and your blood pressure will be checked.  If you are unable to exercise, you may be given a medicine to make your heart beat faster.  When blood flow to your heart has peaked, tracer will again be injected through the IV tube.  After 20-40 minutes, you will get back on the exam table and have more images taken of your heart.  When the procedure is over, your IV tube will be removed. AFTER THE PROCEDURE  You will likely be able to leave shortly after the test. Unless your health care provider tells you otherwise, you may return to your normal schedule, including diet, activities, and medicines.  Make sure you find out how and when you will get your test results. This information is not intended to replace advice given to you by your health care provider. Make sure you discuss any questions you have with your health care provider. Document Released: 07/19/2004 Document Revised: 06/29/2013 Document Reviewed: 06/02/2013 Elsevier Interactive Patient Education  2017 [employer] Cardiac Nuclear Scanning A cardiac nuclear scan is used to check your heart for problems, such as the following:  A portion of the heart is not getting enough blood.  Part of the heart muscle has died, which happens with a heart attack.  The heart wall is not working normally.  In this test, a radioactive dye (tracer) is injected into your bloodstream. After the tracer has traveled to  your heart, a scanning device is used to measure how much of the tracer is absorbed by or distributed to various areas of your heart. LET [facility] CARE PROVIDER KNOW ABOUT:  Any allergies you have.  All medicines you are taking, including vitamins, herbs, eye drops, creams, and over-the-counter medicines.  Previous problems you or members of your family have had with the use of anesthetics.  Any blood disorders you have.  Previous surgeries you have had.  Medical conditions you have.  RISKS AND COMPLICATIONS Generally, this is a safe procedure. However, as with any procedure, problems can occur. Possible problems include:   Serious chest pain.  Rapid heartbeat.  Sensation of warmth in your chest. This usually passes quickly. BEFORE THE PROCEDURE Ask your health care provider about changing or stopping your regular medicines. PROCEDURE This procedure is usually done at a hospital and takes 2-4 hours.  An IV tube is inserted into one of your veins.  Your health care provider will inject a small amount of radioactive tracer through the tube.  You will then wait for 20-40 minutes while the tracer travels through your  bloodstream.  You will lie down on an exam table so images of your heart can be taken. Images will be taken for about 15-20 minutes.  You will exercise on a treadmill or stationary bike. While you exercise, your heart activity will be monitored with an electrocardiogram (ECG), and your blood pressure will be checked.  If you are unable to exercise, you may be given a medicine to make your heart beat faster.  When blood flow to your heart has peaked, tracer will again be injected through the IV tube.  After 20-40 minutes, you will get back on the exam table and have more images taken of your heart.  When the procedure is over, your IV tube will be removed. AFTER THE PROCEDURE  You will likely be able to leave shortly after the test. Unless your health care  provider tells you otherwise, you may return to your normal schedule, including diet, activities, and medicines.  Make sure you find out how and when you will get your test results. This information is not intended to replace advice given to you by your health care provider. Make sure you discuss any questions you have with your health care provider. Document Released: 07/19/2004 Document Revised: 06/29/2013 Document Reviewed: 06/02/2013 Elsevier Interactive Patient Education  2017 [employer]

## 2016-08-28 ENCOUNTER — Other Ambulatory Visit: Payer: Self-pay

## 2016-08-28 ENCOUNTER — Telehealth: Payer: Self-pay | Admitting: Cardiovascular Disease

## 2016-08-28 MED ORDER — PREDNISONE 20 MG PO TABS: ORAL_TABLET | ORAL | 0 refills | Status: DC

## 2016-08-28 NOTE — Telephone Encounter (Signed)
NSAIDs are not recommended given that he is on Xarelto. Recommend Prednisone 20 mg daily for 5 days but take 40 mg the first day.

## 2016-08-28 NOTE — Telephone Encounter (Signed)
Pt reports colchicine was $90 after insurance. He is agreeable to prednisone and understands instructions. [facility]cription [facility]

## 2016-08-28 NOTE — Telephone Encounter (Signed)
Pt informed pharmacist he can not afford colchicine and requests indomethacin for gout. Routed to Dr. Kirke CorinArida.

## 2016-09-03 ENCOUNTER — Ambulatory Visit (INDEPENDENT_AMBULATORY_CARE_PROVIDER_SITE_OTHER): Payer: Medicare Other | Admitting: Adult Health

## 2016-09-03 ENCOUNTER — Encounter: Payer: Self-pay | Admitting: Adult Health

## 2016-09-03 VITALS — BP 155/82 | HR 59 | Ht 71.0 in | Wt 221.5 lb

## 2016-09-03 DIAGNOSIS — Z23 Encounter for immunization: Secondary | ICD-10-CM | POA: Diagnosis not present

## 2016-09-03 DIAGNOSIS — E784 Other hyperlipidemia: Secondary | ICD-10-CM | POA: Diagnosis not present

## 2016-09-03 DIAGNOSIS — I480 Paroxysmal atrial fibrillation: Secondary | ICD-10-CM

## 2016-09-03 DIAGNOSIS — R7309 Other abnormal glucose: Secondary | ICD-10-CM

## 2016-09-03 DIAGNOSIS — Z0000 Encounter for general adult medical examination without abnormal findings: Secondary | ICD-10-CM | POA: Diagnosis not present

## 2016-09-03 DIAGNOSIS — E7849 Other hyperlipidemia: Secondary | ICD-10-CM

## 2016-09-03 NOTE — Patient Instructions (Signed)
Atrial Fibrillation Atrial fibrillation is a type of irregular or rapid heartbeat (arrhythmia). In atrial fibrillation, the heart quivers continuously in a chaotic pattern. This occurs when parts of the heart receive disorganized signals that make the heart unable to pump blood normally. This can increase the risk for stroke, heart failure, and other heart-related conditions. There are different types of atrial fibrillation, including:  Paroxysmal atrial fibrillation. This type starts suddenly, and it usually stops on its own shortly after it starts.  Persistent atrial fibrillation. This type often lasts longer than a week. It may stop on its own or with treatment.  Long-lasting persistent atrial fibrillation. This type lasts longer than 12 months.  Permanent atrial fibrillation. This type does not go away. Talk with your health care provider to learn about the type of atrial fibrillation that you have. What are the causes? This condition is caused by some heart-related conditions or procedures, including:  A heart attack.  Coronary artery disease.  Heart failure.  Heart valve conditions.  High blood pressure.  Inflammation of the sac that surrounds the heart (pericarditis).  Heart surgery.  Certain heart rhythm disorders, such as Wolf-Parkinson-White syndrome. Other causes include:  Pneumonia.  Obstructive sleep apnea.  Blockage of an artery in the lungs (pulmonary embolism, or PE).  Lung cancer.  Chronic lung disease.  Thyroid problems, especially if the thyroid is overactive (hyperthyroidism).  Caffeine.  Excessive alcohol use or illegal drug use.  Use of some medicines, including certain decongestants and diet pills. Sometimes, the cause cannot be found. What increases the risk? This condition is more likely to develop in:  People who are older in age.  People who smoke.  People who have diabetes mellitus.  People who are overweight (obese).  Athletes  who exercise vigorously. What are the signs or symptoms? Symptoms of this condition include:  A feeling that your heart is beating rapidly or irregularly.  A feeling of discomfort or pain in your chest.  Shortness of breath.  Sudden light-headedness or weakness.  Getting tired easily during exercise. In some cases, there are no symptoms. How is this diagnosed? Your health care provider may be able to detect atrial fibrillation when taking your pulse. If detected, this condition may be diagnosed with:  An electrocardiogram (ECG).  A Holter monitor test that records your heartbeat patterns over a 24-hour period.  Transthoracic echocardiogram (TTE) to evaluate how blood flows through your heart.  Transesophageal echocardiogram (TEE) to view more detailed images of your heart.  A stress test.  Imaging tests, such as a CT scan or chest X-ray.  Blood tests. How is this treated? The main goals of treatment are to prevent blood clots from forming and to keep your heart beating at a normal rate and rhythm. The type of treatment that you receive depends on many factors, such as your underlying medical conditions and how you feel when you are experiencing atrial fibrillation. This condition may be treated with:  Medicine to slow down the heart rate, bring the heart's rhythm back to normal, or prevent clots from forming.  Electrical cardioversion. This is a procedure that resets your heart's rhythm by delivering a controlled, low-energy shock to the heart through your skin.  Different types of ablation, such as catheter ablation, catheter ablation with pacemaker, or surgical ablation. These procedures destroy the heart tissues that send abnormal signals. When the pacemaker is used, it is placed under your skin to help your heart beat in a regular rhythm. Follow these instructions  at home:  Take over-the counter and prescription medicines only as told by your health care provider.  If  your health care provider prescribed a blood-thinning medicine (anticoagulant), take it exactly as told. Taking too much blood-thinning medicine can cause bleeding. If you do not take enough blood-thinning medicine, you will not have the protection that you need against stroke and other problems.  Do not use tobacco products, including cigarettes, chewing tobacco, and e-cigarettes. If you need help quitting, ask your health care provider.  If you have obstructive sleep apnea, manage your condition as told by your health care provider.  Do not drink alcohol.  Do not drink beverages that contain caffeine, such as coffee, soda, and tea.  Maintain a healthy weight. Do not use diet pills unless your health care provider approves. Diet pills may make heart problems worse.  Follow diet instructions as told by your health care provider.  Exercise regularly as told by your health care provider.  Keep all follow-up visits as told by your health care provider. This is important. How is this prevented?  Avoid drinking beverages that contain caffeine or alcohol.  Avoid certain medicines, especially medicines that are used for breathing problems.  Avoid certain herbs and herbal medicines, such as those that contain ephedra or ginseng.  Do not use illegal drugs, such as cocaine and amphetamines.  Do not smoke.  Manage your high blood pressure. Contact a health care provider if:  You notice a change in the rate, rhythm, or strength of your heartbeat.  You are taking an anticoagulant and you notice increased bruising.  You tire more easily when you exercise or exert yourself. Get help right away if:  You have chest pain, abdominal pain, sweating, or weakness.  You feel nauseous.  You notice blood in your vomit, bowel movement, or urine.  You have shortness of breath.  You suddenly have swollen feet and ankles.  You feel dizzy.  You have sudden weakness or numbness of the face, arm,  or leg, especially on one side of the body.  You have trouble speaking, trouble understanding, or both (aphasia).  Your face or your eyelid droops on one side. These symptoms may represent a serious problem that is an emergency. Do not wait to see if the symptoms will go away. Get medical help right away. Call your local emergency services (911 in the [place] Do not drive yourself to the hospital. This information is not intended to replace advice given to you by your health care provider. Make sure you discuss any questions you have with your health care provider. Document Released: 06/24/2005 Document Revised: 11/01/2015 Document Reviewed: 10/19/2014 Elsevier Interactive Patient Education  2017 [employer]  Please continue medications as directed. Continue with Cardiology as directed. Regular follow-up in 6 months, sooner if needed. Please schedule fasting nurse visit at your convenience.

## 2016-09-03 NOTE — Progress Notes (Signed)
Subjective:    Patient ID: Joel Ford, male    DOB: June 25, 1951, 66 y.o.   MRN: [id number]  HPI:  Joel Ford presents to establish as a new pt.  He is a very pleasant 65 year that served in [employer] for > 35 years and recently retired 3 years ago.  PMH:  Atrial Fib (recent hospitalization for uncontrolled rate), hyperglycemia, elevated cholesterol, various musculoskeletal issues.  He also presents with tremor of head and RUE-that has been present for > 5 years, however he denies hx of neurological d/o, hx of cervical neck injury.  He has never seen Neurologist and denies tremor affecting ADLs.  He was seen by Cards last week and has Stress Test scheduled for next week.  He is happily retired and remains active with golf and yard work.   Patient Care Team    Relationship Specialty Notifications Start End  Malon Kindle, NP PCP - General Family Medicine  08/27/16   Iran Ouch, MD Consulting Physician Cardiology  09/03/16     Patient Active Problem List   Diagnosis Date Noted  . Health care maintenance 09/03/2016  . New onset atrial fibrillation (HCC) 08/06/2016  . B12 deficiency anemia 08/06/2016  . Anemia, iron deficiency 08/06/2016  . Hypothyroidism 08/06/2016  . Paroxysmal atrial fibrillation (HCC) 08/05/2016  . Gout   . Left knee DJD   . Preop cardiovascular exam 09/06/2012  . Arthritis of knee, degenerative 08/30/2012  . EXTERNAL HEMORRHOIDS 12/26/2009  . S/P total knee replacement 08/07/2009  . HYPERGLYCEMIA 07/27/2009  . Hyperlipidemia 07/04/2009  . ABNORMAL ELECTROCARDIOGRAM 07/04/2009  . CHEST WALL PAIN, ACUTE 08/30/2008  . RENAL CALCULUS 02/15/2008  . Thoracic or lumbosacral neuritis or radiculitis 02/15/2008  . GOUT 01/22/2008     Past Medical History:  Diagnosis Date  . ABNORMAL ELECTROCARDIOGRAM 07/04/2009   Qualifier: Diagnosis of  By: Jillyn Hidden FNP, Mcarthur Rossetti   . Atrial fibrillation (HCC)   . Gout   . HYPERGLYCEMIA 07/27/2009   Qualifier: Diagnosis  of  By: Hetty Ely MD, Franne Grip   . HYPERLIPIDEMIA 07/04/2009   Qualifier: Diagnosis of  By: Jillyn Hidden FNP, Mcarthur Rossetti   . Left knee DJD    OA  . RENAL CALCULUS 02/15/2008   Qualifier: Diagnosis of  By: Jillyn Hidden FNP, Mcarthur Rossetti   . S/P total knee replacement 08/07/2009   right  . Thyroid disease      Past Surgical History:  Procedure Laterality Date  . APPENDECTOMY     age 52  . CARDIAC CATHETERIZATION  01/23/2005   Normal  . FINGER SURGERY     RIGHT INDEX, REATTACHED  . orthoscopy     Right and left knee  . TOTAL KNEE ARTHROPLASTY  2011   Right  . TOTAL KNEE ARTHROPLASTY Left 09/21/2012   Dr Thurston Hole  . TOTAL KNEE ARTHROPLASTY Left 09/21/2012   Procedure: TOTAL KNEE ARTHROPLASTY;  Surgeon: Nilda Simmer, MD;  Location: [facility] OR;  Service: Orthopedics;  Laterality: Left;     Family History  Problem Relation Age of Onset  . Stroke Father   . Healthy Mother   . Healthy Brother   . Healthy Son   . Colon cancer Neg Hx   . Prostate cancer Neg Hx      History  Drug Use No     History  Alcohol Use No     History  Smoking Status  . Never Smoker  Smokeless Tobacco  . Current User  . Types: Chew  Outpatient Encounter Prescriptions as of 09/03/2016  Medication Sig  . diltiazem (CARDIZEM CD) 240 MG 24 hr capsule Take 1 capsule (240 mg total) by mouth daily.  . ferrous sulfate (FERROUSUL) 325 (65 FE) MG tablet Take 1 tablet (325 mg total) by mouth daily with breakfast.  . levothyroxine (SYNTHROID, LEVOTHROID) 25 MCG tablet Take 1 tablet (25 mcg total) by mouth daily before breakfast.  . metoprolol tartrate (LOPRESSOR) 25 MG tablet Take 0.5 tablets (12.5 mg total) by mouth 2 (two) times daily.  . rivaroxaban (XARELTO) 20 MG TABS tablet Take 1 tablet (20 mg total) by mouth daily with supper.  . [DISCONTINUED] predniSONE (DELTASONE) 20 MG tablet Take 40mg  on day one then 20mg  once daily on days 2-5   No facility-administered encounter medications on file as  of 09/03/2016.     Allergies: Patient has no known allergies.  Body mass index is 30.89 kg/m.  Blood pressure (!) 155/82, pulse (!) 59, height 5\' 11"  (1.803 m), weight 221 lb 8 oz (100.5 kg).     Review of Systems  Constitutional: Negative for activity change, appetite change, chills, diaphoresis, fatigue, fever and unexpected weight change.  HENT: Negative for congestion.   Eyes: Negative for visual disturbance.  Respiratory: Negative for cough and shortness of breath.   Cardiovascular: Negative for chest pain, palpitations and leg swelling.  Gastrointestinal: Negative for abdominal distention, abdominal pain, constipation, diarrhea, nausea and vomiting.  Endocrine: Negative for cold intolerance, heat intolerance, polydipsia, polyphagia and polyuria.  Genitourinary: Negative for difficulty urinating and flank pain.  Musculoskeletal: Negative for arthralgias, back pain, gait problem, joint swelling, myalgias, neck pain and neck stiffness.  Skin: Negative for color change, pallor, rash and wound.  Neurological: Positive for tremors. Negative for dizziness, seizures, syncope, facial asymmetry, speech difficulty, weakness, light-headedness, numbness and headaches.       Tremor of head and RUE > 5 years. Never evaluated by Neurology and denies cervical neck injury/hx of Neurological d/o.  He denies tremor affecting ADLs.  Psychiatric/Behavioral: Negative for agitation, behavioral problems, decreased concentration and sleep disturbance. The patient is not nervous/anxious and is not hyperactive.        Objective:   Physical Exam  Constitutional: He is oriented to person, place, and time. He appears well-developed and well-nourished. No distress.  HENT:  Head: Normocephalic and atraumatic.  Right Ear: External ear normal.  Left Ear: External ear normal.  Constant tremor of head/neck.  Eyes: Conjunctivae are normal. Pupils are equal, round, and reactive to light.  Neck: Normal range  of motion. Neck supple.  Cardiovascular: Normal rate, regular rhythm, normal heart sounds and intact distal pulses.   No murmur heard. Pulmonary/Chest: Breath sounds normal. No respiratory distress. He has no wheezes. He has no rales. He exhibits no tenderness.  Lymphadenopathy:    He has no cervical adenopathy.  Neurological: He is alert and oriented to person, place, and time. He has normal reflexes. He displays tremor. No cranial nerve deficit or sensory deficit. He exhibits normal muscle tone. Coordination and gait normal.  Constant tremor noted of head and RUE.     Skin: Skin is warm and dry. No rash noted. He is not diaphoretic. No erythema. No pallor.  Psychiatric: He has a normal mood and affect. His behavior is normal. Judgment and thought content normal.  Nursing note and vitals reviewed.         Assessment & Plan:   1. Other hyperlipidemia   2. HYPERGLYCEMIA   3. Need for Tdap  vaccination   4. Paroxysmal atrial fibrillation (HCC)   5. Health care maintenance     Paroxysmal atrial fibrillation [facility](HCC) Recent hospitalization for uncontrolled Atrial Fibrillation. Cards visit last week, has Stress Test scheduled next week. Continue Diltiazem, Metoprolol, Riaroxaban as directed.    Hyperlipidemia Reduce saturated fat intake. Please schedule fasting lab nurse visit.  HYPERGLYCEMIA Will check A1c. Continue to reduce sugar/CHO intake. Increase regular exercise.  Health care maintenance Heart healthy diet. Increase regular exercise. Please schedule fasting lab nurse visit at your convenience. Continue f/u with Cards as directed. PCP f/u every 6 months, sooner if needed.    FOLLOW-UP:  Return in about 6 months (around 03/03/2017) for Regular Follow Up.

## 2016-09-03 NOTE — Assessment & Plan Note (Signed)
Recent hospitalization for uncontrolled Atrial Fibrillation. Cards visit last week, has Stress Test scheduled next week. Continue Diltiazem, Metoprolol, Riaroxaban as directed.

## 2016-09-03 NOTE — Assessment & Plan Note (Signed)
Reduce saturated fat intake. Please schedule fasting lab nurse visit.

## 2016-09-03 NOTE — Assessment & Plan Note (Signed)
Heart healthy diet. Increase regular exercise. Please schedule fasting lab nurse visit at your convenience. Continue f/u with Cards as directed. PCP f/u every 6 months, sooner if needed.

## 2016-09-03 NOTE — Assessment & Plan Note (Signed)
Will check A1c. Continue to reduce sugar/CHO intake. Increase regular exercise.

## 2016-09-04 ENCOUNTER — Encounter
Admission: RE | Admit: 2016-09-04 | Discharge: 2016-09-04 | Disposition: A | Discharge status: Home / Self Care | Payer: Medicare Other | Source: Ambulatory Visit | Attending: Cardiovascular Disease | Admitting: Cardiovascular Disease

## 2016-09-04 ENCOUNTER — Telehealth: Payer: Self-pay | Admitting: Cardiovascular Disease

## 2016-09-04 DIAGNOSIS — R0602 Shortness of breath: Secondary | ICD-10-CM | POA: Diagnosis not present

## 2016-09-04 DIAGNOSIS — R9431 Abnormal electrocardiogram [ECG] [EKG]: Secondary | ICD-10-CM | POA: Diagnosis not present

## 2016-09-04 LAB — NM MYOCAR MULTI W/SPECT W/WALL MOTION / EF
CHL CUP NUCLEAR SDS: 1
CHL CUP RESTING HR STRESS: 55 {beats}/min
CSEPEDS: 30 s
CSEPEW: 6.4 METS
CSEPPHR: 150 {beats}/min
Exercise duration (min): 4 min
LV dias vol: 75 mL (ref 62–150)
LVSYSVOL: 37 mL
NUC STRESS TID: 0.9
Percent HR: 93 %
SRS: 3
SSS: 3

## 2016-09-04 MED ORDER — METOPROLOL TARTRATE 25 MG PO TABS: 12.5000 mg | ORAL_TABLET | Freq: Two times a day (BID) | ORAL | 0 refills | Status: DC

## 2016-09-04 MED ORDER — TECHNETIUM TC 99M TETROFOSMIN IV KIT
30.5460 | PACK | Freq: Once | INTRAVENOUS | Status: AC | PRN
  Administered 2016-09-04: 30.546 via INTRAVENOUS

## 2016-09-04 MED ORDER — TECHNETIUM TC 99M TETROFOSMIN IV KIT
13.0000 | PACK | Freq: Once | INTRAVENOUS | Status: AC | PRN
  Administered 2016-09-04: 13.27 via INTRAVENOUS

## 2016-09-04 MED ORDER — RIVAROXABAN 20 MG PO TABS
20.0000 mg | ORAL_TABLET | Freq: Every day | ORAL | 0 refills | Status: DC
Start: 2016-09-04 — End: 2016-09-05

## 2016-09-04 MED ORDER — DILTIAZEM HCL ER COATED BEADS 240 MG PO CP24: 240.0000 mg | ORAL_CAPSULE | Freq: Every day | ORAL | 0 refills | Status: DC

## 2016-09-04 NOTE — Telephone Encounter (Signed)
Refill sent for Diltiazem, Metoprolol and xarelto.

## 2016-09-04 NOTE — Telephone Encounter (Signed)
*  STAT* If patient is at the pharmacy, call can be transferred to refill team.   1. Which medications need to be refilled? (please list name of each medication and dose if known) All heart meds need to be refilled.  2. Which pharmacy/location (including street and city if local pharmacy) is medication to be sent to? [facility]  3. Do they need a 30 day or 90 day supply? 30 day

## 2016-09-05 ENCOUNTER — Telehealth: Payer: Self-pay | Admitting: Cardiovascular Disease

## 2016-09-05 ENCOUNTER — Other Ambulatory Visit: Payer: Self-pay

## 2016-09-05 MED ORDER — METOPROLOL TARTRATE 25 MG PO TABS: 12.5000 mg | ORAL_TABLET | Freq: Two times a day (BID) | ORAL | 3 refills | Status: DC

## 2016-09-05 MED ORDER — DILTIAZEM HCL ER COATED BEADS 240 MG PO CP24: 240.0000 mg | ORAL_CAPSULE | Freq: Every day | ORAL | 3 refills | Status: DC

## 2016-09-05 MED ORDER — RIVAROXABAN 20 MG PO TABS: 20.0000 mg | ORAL_TABLET | Freq: Every day | ORAL | 3 refills | Status: DC

## 2016-09-05 NOTE — Telephone Encounter (Signed)
Returned call to patient's wife, ok per DPR. Discussed patient's medications and verified that should still be on the diltiazem, levothyroxine, and metoprolol as noted in OV from 08/27/16 with Dr Kirke CorinArida. Refills sent in to new pharmacy for these meds. Advised for her to contact patient' PCP regarding iron and synthroid for management and if she has any problems with that to call us back to discuss. She verbalized understanding.

## 2016-09-05 NOTE — Telephone Encounter (Signed)
Please call pt wife to discuss which medications pt should be taking. She has the understanding he should be taking 5 "heart medications based on D/C from his hospital stay". Refill requests was sent for approval from Xarelto, Cardizem, and Metoprolol.

## 2016-09-24 ENCOUNTER — Other Ambulatory Visit
Admission: RE | Admit: 2016-09-24 | Discharge: 2016-09-24 | Disposition: A | Discharge status: Home / Self Care | Payer: Medicare Other | Source: Ambulatory Visit | Attending: Cardiovascular Disease | Admitting: Cardiovascular Disease

## 2016-09-24 ENCOUNTER — Encounter: Payer: Self-pay | Admitting: Cardiovascular Disease

## 2016-09-24 ENCOUNTER — Ambulatory Visit (INDEPENDENT_AMBULATORY_CARE_PROVIDER_SITE_OTHER): Payer: Medicare Other | Admitting: Cardiovascular Disease

## 2016-09-24 VITALS — BP 132/80 | HR 63 | Ht 70.0 in | Wt 221.5 lb

## 2016-09-24 DIAGNOSIS — I359 Nonrheumatic aortic valve disorder, unspecified: Secondary | ICD-10-CM

## 2016-09-24 DIAGNOSIS — I480 Paroxysmal atrial fibrillation: Secondary | ICD-10-CM | POA: Diagnosis not present

## 2016-09-24 LAB — CBC
HEMATOCRIT: 40.6 % (ref 40.0–52.0)
HEMOGLOBIN: 13 g/dL (ref 13.0–18.0)
MCH: 23.9 pg — AB (ref 26.0–34.0)
MCHC: 31.9 g/dL — AB (ref 32.0–36.0)
MCV: 74.9 fL — ABNORMAL LOW (ref 80.0–100.0)
Platelets: 135 10*3/uL — ABNORMAL LOW (ref 150–440)
RBC: 5.42 MIL/uL (ref 4.40–5.90)
RDW: 25.7 % — AB (ref 11.5–14.5)
WBC: 6.2 10*3/uL (ref 3.8–10.6)

## 2016-09-24 LAB — BASIC METABOLIC PANEL
Anion gap: 7 (ref 5–15)
BUN: 19 mg/dL (ref 6–20)
CALCIUM: 9.2 mg/dL (ref 8.9–10.3)
CHLORIDE: 103 mmol/L (ref 101–111)
CO2: 29 mmol/L (ref 22–32)
CREATININE: 1.15 mg/dL (ref 0.61–1.24)
GFR calc Af Amer: >60 mL/min (ref >60)
GFR calc non Af Amer: >60 mL/min (ref >60)
GLUCOSE: 102 mg/dL — AB (ref 65–99)
Potassium: 4.1 mmol/L (ref 3.5–5.1)
Sodium: 139 mmol/L (ref 135–145)

## 2016-09-24 NOTE — Patient Instructions (Signed)
Medication Instructions:  Your physician recommends that you continue on your current medications as directed. Please refer to the Current Medication list given to you today.   Labwork: BMET, CBC today in [facility]  Testing/Procedures: none  Follow-Up: Your physician wants you to follow-up in: six months with Dr. Kirke CorinArida.  You will receive a reminder letter in the mail two months in advance. If you don't receive a letter, please call our office to schedule the follow-up appointment.   Any Other Special Instructions Will Be Listed Below (If Applicable).     If you need a refill on your cardiac medications before your next appointment, please call your pharmacy.

## 2016-09-24 NOTE — Progress Notes (Signed)
Cardiology Office Note   Date:  09/24/2016   ID:  Jennefer BravoLee Roy Kistler, DOB [birth year], MRN [id number]  PCP:  Laurance FlattenKaty Bess, NP  Cardiologist:   Lorine BearsMuhammad Taisley Mordan, MD   Chief Complaint  Patient presents with  . [facility].  Pt stated that he is "doing good" today.  Meds reviewed verbally with patient.       History of Present Illness: Marcy PanningLee Roy Pevey is a 66 y.o. male who presents for a follow-up visit regarding paroxysmal atrial fibrillation. He was hospitalized in January 2018 due to A. fib with RVR. Echocardiogram showed normal LV systolic function, mild aortic stenosis, mild to moderate mitral regurgitation, mild to moderately dilated left atrium and mild pulmonary hypertension. He was treated with diltiazem and was started on anticoagulation with Xarelto. He was found to have iron deficiency anemia and was started on ferrous sulfate.  He was noted to be in sinus rhythm upon follow-up.  He underwent a treadmill stress test which showed no evidence of ischemia. He has been doing very well and denies any chest pain or shortness of breath. No palpitations or dizziness.  Past Medical History:  Diagnosis Date  . ABNORMAL ELECTROCARDIOGRAM 07/04/2009   Qualifier: Diagnosis of  By: Jillyn HiddenBean FNP, Mcarthur RossettiBillie-Lynn Daniels   . Atrial fibrillation (HCC)   . Gout   . HYPERGLYCEMIA 07/27/2009   Qualifier: Diagnosis of  By: Hetty ElySchaller MD, Franne Gripobert Neal   . HYPERLIPIDEMIA 07/04/2009   Qualifier: Diagnosis of  By: Jillyn HiddenBean FNP, Mcarthur RossettiBillie-Lynn Daniels   . Left knee DJD    OA  . RENAL CALCULUS 02/15/2008   Qualifier: Diagnosis of  By: Jillyn HiddenBean FNP, Mcarthur RossettiBillie-Lynn Daniels   . S/P total knee replacement 08/07/2009   right  . Thyroid disease     Past Surgical History:  Procedure Laterality Date  . APPENDECTOMY     age 66  . CARDIAC CATHETERIZATION  01/23/2005   Normal  . FINGER SURGERY     RIGHT INDEX, REATTACHED  . orthoscopy     Right and left knee  . TOTAL KNEE ARTHROPLASTY   2011   Right  . TOTAL KNEE ARTHROPLASTY Left 09/21/2012   Dr Thurston HoleWainer  . TOTAL KNEE ARTHROPLASTY Left 09/21/2012   Procedure: TOTAL KNEE ARTHROPLASTY;  Surgeon: Nilda Simmerobert A Wainer, MD;  Location: [facility] OR;  Service: Orthopedics;  Laterality: Left;     Current Outpatient Prescriptions  Medication Sig Dispense Refill  . diltiazem (CARDIZEM CD) 240 MG 24 hr capsule Take 1 capsule (240 mg total) by mouth daily. 90 capsule 3  . ferrous sulfate (FERROUSUL) 325 (65 FE) MG tablet Take 1 tablet (325 mg total) by mouth daily with breakfast. 30 tablet 0  . levothyroxine (SYNTHROID, LEVOTHROID) 25 MCG tablet Take 1 tablet (25 mcg total) by mouth daily before breakfast. 30 tablet 0  . metoprolol tartrate (LOPRESSOR) 25 MG tablet Take 0.5 tablets (12.5 mg total) by mouth 2 (two) times daily. 90 tablet 3  . rivaroxaban (XARELTO) 20 MG TABS tablet Take 1 tablet (20 mg total) by mouth daily with supper. 90 tablet 3   No current facility-administered medications for this visit.     Allergies:   Patient has no known allergies.    Social History:  The patient  reports that he has never smoked. His smokeless tobacco use includes Chew. He reports that he does not drink alcohol or use drugs.   Family History:  The patient's family history  includes Healthy in his brother, mother, and son; Stroke in his father.    ROS:  Please see the history of present illness.   Otherwise, review of systems are positive for none.   All other systems are reviewed and negative.    PHYSICAL EXAM: VS:  BP 132/80 (BP Location: Left Arm, Patient Position: Sitting, Cuff Size: Normal)   Pulse 63   Ht 5\' 10"  (1.778 m)   Wt 221 lb 8 oz (100.5 kg)   BMI 31.78 kg/m  , BMI Body mass index is 31.78 kg/m. GEN: Well nourished, well developed, in no acute distress  HEENT: normal  Neck: no JVD, carotid bruits, or masses Cardiac: RRR; no  rubs, or gallops,no edema . 2/6 systolic ejection murmur in the aortic area and 2/6 holosystolic murmur  at the apex Respiratory:  clear to auscultation bilaterally, normal work of breathing GI: soft, nontender, nondistended, + BS MS: no deformity or atrophy  Skin: warm and dry, no rash Neuro:  Strength and sensation are intact Psych: euthymic mood, full affect   EKG:  EKG is not ordered today.    Recent Labs: 08/05/2016: BUN 18; Creatinine, Ser 1.16; Hemoglobin 10.6; Platelets 148; Potassium 3.9; Sodium 139 08/06/2016: Magnesium 2.0; TSH 16.551    Lipid Panel    Component Value Date/Time   CHOL 235 (H) 07/04/2009 1014   TRIG 161.0 (H) 07/04/2009 1014   HDL 32.40 (L) 07/04/2009 1014   CHOLHDL 7 07/04/2009 1014   VLDL 32.2 07/04/2009 1014   LDLDIRECT 179.9 07/04/2009 1014      Wt Readings from Last 3 Encounters:  09/24/16 221 lb 8 oz (100.5 kg)  09/03/16 221 lb 8 oz (100.5 kg)  08/27/16 224 lb 8 oz (101.8 kg)       No flowsheet data found.    ASSESSMENT AND PLAN:  1.  Paroxysmal atrial fibrillation:  The patient continues to be in normal sinus rhythm. Continue treatment with diltiazem and small dose metoprolol. Continue long-term anticoagulation. I requested CBC and basic metabolic profile given that he is on anticoagulation.  2. Iron deficiency anemia: Repeat CBC today. I offered him referral to GI for colonoscopy but he wants to wait.  3. Mild aortic stenosis and mild to moderate mitral regurgitation: I recommend repeat echocardiogram in one to 2 years.   4. Abnormal EKG: Nuclear stress test showed no evidence of ischemia.  Disposition:   FU with me in 6 months  Signed,  Lorine Bears, MD  09/24/2016 2:18 PM    [facility]

## 2016-10-18 ENCOUNTER — Other Ambulatory Visit: Payer: Self-pay

## 2016-10-18 NOTE — Telephone Encounter (Signed)
Attempted to reach pt re: unread MyChart message regarding recent lab test results.  LVM to return call.  Tiajuana Amass, CMA

## 2016-10-21 MED ORDER — FERROUS SULFATE 325 (65 FE) MG PO TABS: 325.0000 mg | ORAL_TABLET | Freq: Every day | ORAL | 0 refills | Status: DC

## 2016-10-21 MED ORDER — LEVOTHYROXINE SODIUM 25 MCG PO TABS: 25.0000 ug | ORAL_TABLET | Freq: Every day | ORAL | 0 refills | Status: DC

## 2016-10-21 NOTE — Telephone Encounter (Signed)
RXs faxed to pharmacy.   T. Nelson, CMA 

## 2016-10-21 NOTE — Telephone Encounter (Signed)
Pt informed of results.  Pt declined referral to GI, stating that he feels "fine".  Pt requested refills on ferrous sulfate and levothyroxine.  Tiajuana Amass, CMA

## 2016-11-27 ENCOUNTER — Other Ambulatory Visit: Payer: Self-pay

## 2016-11-27 MED ORDER — FERROUS SULFATE 325 (65 FE) MG PO TABS: 325.0000 mg | ORAL_TABLET | Freq: Every day | ORAL | 0 refills | Status: DC

## 2016-11-27 MED ORDER — LEVOTHYROXINE SODIUM 25 MCG PO TABS: 25.0000 ug | ORAL_TABLET | Freq: Every day | ORAL | 0 refills | Status: DC

## 2016-11-27 NOTE — Telephone Encounter (Signed)
Staff message sent to Genella RifeGail Howard to call pt to schedule f/u OV with labs for thyroid.  Tiajuana Amass. Aaren Atallah, CMA

## 2016-12-26 ENCOUNTER — Other Ambulatory Visit: Payer: Self-pay

## 2016-12-26 MED ORDER — LEVOTHYROXINE SODIUM 25 MCG PO TABS: 25.0000 ug | ORAL_TABLET | Freq: Every day | ORAL | 1 refills | Status: DC

## 2016-12-26 NOTE — Telephone Encounter (Signed)
Refill request for levothyroxine.  Refill sent.  Patient needs to keep appointment for further refills.

## 2016-12-27 ENCOUNTER — Other Ambulatory Visit: Payer: Self-pay

## 2016-12-27 MED ORDER — FERROUS SULFATE 325 (65 FE) MG PO TABS: 325.0000 mg | ORAL_TABLET | Freq: Every day | ORAL | 1 refills | Status: DC

## 2016-12-29 ENCOUNTER — Telehealth: Payer: Self-pay | Admitting: Physician Assistant

## 2016-12-29 NOTE — Telephone Encounter (Signed)
The patient's wife called the answering service after-hours today. Patient has been experiencing gout in his foot and can barely walk. States in the past prednisone has been called in. He has history of PAF, on anticoagulation, with h/o IDA. I explained that gout is typically a condition followed in primary care office and would recommend he contact on call PCP to discuss further rx and follow-up plan. The patient's wife verbalized understanding and gratitude and plans to do so. Jaydis Duchene PA-C

## 2016-12-30 ENCOUNTER — Telehealth: Payer: Self-pay | Admitting: Adult Health

## 2016-12-30 ENCOUNTER — Telehealth: Payer: Self-pay | Admitting: Cardiovascular Disease

## 2016-12-30 NOTE — Telephone Encounter (Signed)
°*  STAT* If patient is at the pharmacy, call can be transferred to refill team.   1. Which medications need to be refilled? (please list name of each medication and dose if known)  Gout medication   2. Which pharmacy/location (including street and city if local pharmacy) is medication to be sent to? [facility] [address]   3. Do they need a 30 day or 90 day supply? 90 day

## 2016-12-30 NOTE — Telephone Encounter (Signed)
Contacted patient to let him know that he needed to contact his primary care provider for refills for gout medication. He stated " I have an appointment with them in July, ill get it taken care of them."

## 2016-12-30 NOTE — Telephone Encounter (Signed)
Patient called stating he was getting meds for gout from older provider and is nearing the end of the bottle. He is requesting a refill, I told patient I didn't see it on his med list and since Orpha BurKaty has never prescribed it she would need to review this prior to sending a refill out. He was understanding and wants to speak with some clinical to get these med details and to make sure she can possibly do this for him

## 2016-12-31 NOTE — Telephone Encounter (Signed)
OV notes from 2012 encounter: Take the indomethacin as needed with food for pain.  Take the colchicine daily when you have a gout flare.  When the pain is gone for about 1 week, start back on the allopurinol.  Look at the list of foods I gave you.   We never discussed Gout at his new pt intake.   If he is having a flare-up, please have him make an OV so we I can evaluate and re-start him on meds as appropriate.  Thanks! Orpha BurKaty

## 2017-01-01 NOTE — Telephone Encounter (Signed)
Advised patient he would need appointment to get refill of gout medication.  Patient declined appointment.  He does not have enough time to come in.  Patient says he will discuss this at his august appointment.  Again advised patient this is a separate issue and he would need an appointment for gout but he refused.

## 2017-02-11 ENCOUNTER — Ambulatory Visit: Payer: Medicare Other | Admitting: Adult Health

## 2017-02-25 ENCOUNTER — Ambulatory Visit: Payer: Medicare Other | Admitting: Adult Health

## 2017-03-03 ENCOUNTER — Ambulatory Visit: Payer: Medicare Other | Admitting: Adult Health

## 2017-03-03 ENCOUNTER — Ambulatory Visit (INDEPENDENT_AMBULATORY_CARE_PROVIDER_SITE_OTHER): Payer: Medicare Other | Admitting: Adult Health

## 2017-03-03 ENCOUNTER — Encounter: Payer: Self-pay | Admitting: Adult Health

## 2017-03-03 VITALS — BP 158/80 | HR 71 | Ht 70.0 in | Wt 225.5 lb

## 2017-03-03 DIAGNOSIS — E7849 Other hyperlipidemia: Secondary | ICD-10-CM

## 2017-03-03 DIAGNOSIS — E784 Other hyperlipidemia: Secondary | ICD-10-CM | POA: Diagnosis not present

## 2017-03-03 DIAGNOSIS — E039 Hypothyroidism, unspecified: Secondary | ICD-10-CM | POA: Diagnosis not present

## 2017-03-03 DIAGNOSIS — R7309 Other abnormal glucose: Secondary | ICD-10-CM | POA: Diagnosis not present

## 2017-03-03 DIAGNOSIS — I10 Essential (primary) hypertension: Secondary | ICD-10-CM | POA: Diagnosis not present

## 2017-03-03 DIAGNOSIS — M109 Gout, unspecified: Secondary | ICD-10-CM

## 2017-03-03 LAB — POCT GLYCOSYLATED HEMOGLOBIN (HGB A1C): HEMOGLOBIN A1C: 5.6

## 2017-03-03 MED ORDER — COLCHICINE 0.6 MG PO TABS: ORAL_TABLET | ORAL | 0 refills | Status: DC

## 2017-03-03 NOTE — Assessment & Plan Note (Signed)
BP slightly elevated today due to him not taking his anti-hypertensives this morning. He denies current cardiac sx's and has cards f/u 03/2017.

## 2017-03-03 NOTE — Assessment & Plan Note (Signed)
A1c today 5.6-continue regular walking and reducing CHO/sugar in diet.

## 2017-03-03 NOTE — Assessment & Plan Note (Signed)
Colchicine 0.6mg  for gout flare ups. Advised to increase water intake and to follow "Gout Diet".

## 2017-03-03 NOTE — Assessment & Plan Note (Signed)
Last TSH was drawn 08/06/16- 16.5, started on levothyroxine . TSH re-drawn today.

## 2017-03-03 NOTE — Patient Instructions (Signed)
Hypertension Hypertension, commonly called high blood pressure, is when the force of blood pumping through the arteries is too strong. The arteries are the blood vessels that carry blood from the heart throughout the body. Hypertension forces the heart to work harder to pump blood and may cause arteries to become narrow or stiff. Having untreated or uncontrolled hypertension can cause heart attacks, strokes, kidney disease, and other problems. A blood pressure reading consists of a higher number over a lower number. Ideally, your blood pressure should be below 120/80. The first ("top") number is called the systolic pressure. It is a measure of the pressure in your arteries as your heart beats. The second ("bottom") number is called the diastolic pressure. It is a measure of the pressure in your arteries as the heart relaxes. What are the causes? The cause of this condition is not known. What increases the risk? Some risk factors for high blood pressure are under your control. Others are not. Factors you can change  Smoking.  Having type 2 diabetes mellitus, high cholesterol, or both.  Not getting enough exercise or physical activity.  Being overweight.  Having too much fat, sugar, calories, or salt (sodium) in your diet.  Drinking too much alcohol. Factors that are difficult or impossible to change  Having chronic kidney disease.  Having a family history of high blood pressure.  Age. Risk increases with age.  Race. You may be at higher risk if you are African-American.  Gender. Men are at higher risk than women before age 45. After age 65, women are at higher risk than men.  Having obstructive sleep apnea.  Stress. What are the signs or symptoms? Extremely high blood pressure (hypertensive crisis) may cause:  Headache.  Anxiety.  Shortness of breath.  Nosebleed.  Nausea and vomiting.  Severe chest pain.  Jerky movements you cannot control (seizures).  How is this  diagnosed? This condition is diagnosed by measuring your blood pressure while you are seated, with your arm resting on a surface. The cuff of the blood pressure monitor will be placed directly against the skin of your upper arm at the level of your heart. It should be measured at least twice using the same arm. Certain conditions can cause a difference in blood pressure between your right and left arms. Certain factors can cause blood pressure readings to be lower or higher than normal (elevated) for a short period of time:  When your blood pressure is higher when you are in a health care provider's office than when you are at home, this is called white coat hypertension. Most people with this condition do not need medicines.  When your blood pressure is higher at home than when you are in a health care provider's office, this is called masked hypertension. Most people with this condition may need medicines to control blood pressure.  If you have a high blood pressure reading during one visit or you have normal blood pressure with other risk factors:  You may be asked to return on a different day to have your blood pressure checked again.  You may be asked to monitor your blood pressure at home for 1 week or longer.  If you are diagnosed with hypertension, you may have other blood or imaging tests to help your health care provider understand your overall risk for other conditions. How is this treated? This condition is treated by making healthy lifestyle changes, such as eating healthy foods, exercising more, and reducing your alcohol intake. Your   health care provider may prescribe medicine if lifestyle changes are not enough to get your blood pressure under control, and if:  Your systolic blood pressure is above 130.  Your diastolic blood pressure is above 80.  Your personal target blood pressure may vary depending on your medical conditions, your age, and other factors. Follow these  instructions at home: Eating and drinking  Eat a diet that is high in fiber and potassium, and low in sodium, added sugar, and fat. An example eating plan is called the DASH (Dietary Approaches to Stop Hypertension) diet. To eat this way: ? Eat plenty of fresh fruits and vegetables. Try to fill half of your plate at each meal with fruits and vegetables. ? Eat whole grains, such as whole wheat pasta, brown rice, or whole grain bread. Fill about one quarter of your plate with whole grains. ? Eat or drink low-fat dairy products, such as skim milk or low-fat yogurt. ? Avoid fatty cuts of meat, processed or cured meats, and poultry with skin. Fill about one quarter of your plate with lean proteins, such as fish, chicken without skin, beans, eggs, and tofu. ? Avoid premade and processed foods. These tend to be higher in sodium, added sugar, and fat.  Reduce your daily sodium intake. Most people with hypertension should eat less than 1,500 mg of sodium a day.  Limit alcohol intake to no more than 1 drink a day for nonpregnant women and 2 drinks a day for men. One drink equals 12 oz of beer, 5 oz of wine, or 1 oz of hard liquor. Lifestyle  Work with your health care provider to maintain a healthy body weight or to lose weight. Ask what an ideal weight is for you.  Get at least 30 minutes of exercise that causes your heart to beat faster (aerobic exercise) most days of the week. Activities may include walking, swimming, or biking.  Include exercise to strengthen your muscles (resistance exercise), such as pilates or lifting weights, as part of your weekly exercise routine. Try to do these types of exercises for 30 minutes at least 3 days a week.  Do not use any products that contain nicotine or tobacco, such as cigarettes and e-cigarettes. If you need help quitting, ask your health care provider.  Monitor your blood pressure at home as told by your health care provider.  Keep all follow-up visits as  told by your health care provider. This is important. Medicines  Take over-the-counter and prescription medicines only as told by your health care provider. Follow directions carefully. Blood pressure medicines must be taken as prescribed.  Do not skip doses of blood pressure medicine. Doing this puts you at risk for problems and can make the medicine less effective.  Ask your health care provider about side effects or reactions to medicines that you should watch for. Contact a health care provider if:  You think you are having a reaction to a medicine you are taking.  You have headaches that keep coming back (recurring).  You feel dizzy.  You have swelling in your ankles.  You have trouble with your vision. Get help right away if:  You develop a severe headache or confusion.  You have unusual weakness or numbness.  You feel faint.  You have severe pain in your chest or abdomen.  You vomit repeatedly.  You have trouble breathing. Summary  Hypertension is when the force of blood pumping through your arteries is too strong. If this condition is not   controlled, it may put you at risk for serious complications.  Your personal target blood pressure may vary depending on your medical conditions, your age, and other factors. For most people, a normal blood pressure is less than 120/80.  Hypertension is treated with lifestyle changes, medicines, or a combination of both. Lifestyle changes include weight loss, eating a healthy, low-sodium diet, exercising more, and limiting alcohol. This information is not intended to replace advice given to you by your health care provider. Make sure you discuss any questions you have with your health care provider. Document Released: 06/24/2005 Document Revised: 05/22/2016 Document Reviewed: 05/22/2016 Elsevier Interactive Patient Education  2018 [employer].   Heart-Healthy Eating Plan Many factors influence your heart health, including  eating and exercise habits. Heart (coronary) risk increases with abnormal blood fat (lipid) levels. Heart-healthy meal planning includes limiting unhealthy fats, increasing healthy fats, and making other small dietary changes. This includes maintaining a healthy body weight to help keep lipid levels within a normal range. What is my plan? Your health care provider recommends that you:  Get no more than __25__% of the total calories in your daily diet from fat.  Limit your intake of saturated fat to less than __5___% of your total calories each day.  Limit the amount of cholesterol in your diet to less than __300___ mg per day.  What types of fat should I choose?  Choose healthy fats more often. Choose monounsaturated and polyunsaturated fats, such as olive oil and canola oil, flaxseeds, walnuts, almonds, and seeds.  Eat more omega-3 fats. Good choices include salmon, mackerel, sardines, tuna, flaxseed oil, and ground flaxseeds. Aim to eat fish at least two times each week.  Limit saturated fats. Saturated fats are primarily found in animal products, such as meats, butter, and cream. Plant sources of saturated fats include palm oil, palm kernel oil, and coconut oil.  Avoid foods with partially hydrogenated oils in them. These contain trans fats. Examples of foods that contain trans fats are stick margarine, some tub margarines, cookies, crackers, and other baked goods. What general guidelines do I need to follow?  Check food labels carefully to identify foods with trans fats or high amounts of saturated fat.  Fill one half of your plate with vegetables and green salads. Eat 4-5 servings of vegetables per day. A serving of vegetables equals 1 cup of raw leafy vegetables,  cup of raw or cooked cut-up vegetables, or  cup of vegetable juice.  Fill one fourth of your plate with whole grains. Look for the word "whole" as the first word in the ingredient list.  Fill one fourth of your plate with  lean protein foods.  Eat 4-5 servings of fruit per day. A serving of fruit equals one medium whole fruit,  cup of dried fruit,  cup of fresh, frozen, or canned fruit, or  cup of 100% fruit juice.  Eat more foods that contain soluble fiber. Examples of foods that contain this type of fiber are apples, broccoli, carrots, beans, peas, and barley. Aim to get 20-30 g of fiber per day.  Eat more home-cooked food and less restaurant, buffet, and fast food.  Limit or avoid alcohol.  Limit foods that are high in starch and sugar.  Avoid fried foods.  Cook foods by using methods other than frying. Baking, boiling, grilling, and broiling are all great options. Other fat-reducing suggestions include: ? Removing the skin from poultry. ? Removing all visible fats from meats. ? Skimming the fat off  of stews, soups, and gravies before serving them. ? Steaming vegetables in water or broth.  Lose weight if you are overweight. Losing just 5-10% of your initial body weight can help your overall health and prevent diseases such as diabetes and heart disease.  Increase your consumption of nuts, legumes, and seeds to 4-5 servings per week. One serving of dried beans or legumes equals  cup after being cooked, one serving of nuts equals 1 ounces, and one serving of seeds equals  ounce or 1 tablespoon.  You may need to monitor your salt (sodium) intake, especially if you have high blood pressure. Talk with your health care provider or dietitian to get more information about reducing sodium. What foods can I eat? Grains  Breads, including [place], white, pita, wheat, raisin, rye, oatmeal, and [place]. Tortillas that are neither fried nor made with lard or trans fat. Low-fat rolls, including hotdog and hamburger buns and English muffins. Biscuits. Muffins. Waffles. Pancakes. Light popcorn. Whole-grain cereals. Flatbread. Melba toast. Pretzels. Breadsticks. Rusks. Low-fat snacks and crackers, including oyster,  saltine, matzo, graham, animal, and rye. Rice and pasta, including brown rice and those that are made with whole wheat. Vegetables All vegetables. Fruits All fruits, but limit coconut. Meats and Other Protein Sources Lean, well-trimmed beef, veal, pork, and lamb. Chicken and [place] without skin. All fish and shellfish. Wild duck, rabbit, pheasant, and venison. Egg whites or low-cholesterol egg substitutes. Dried beans, peas, lentils, and tofu.Seeds and most nuts. Dairy Low-fat or nonfat cheeses, including ricotta, string, and mozzarella. Skim or 1% milk that is liquid, powdered, or evaporated. Buttermilk that is made with low-fat milk. Nonfat or low-fat yogurt. Beverages Mineral water. Diet carbonated beverages. Sweets and Desserts Sherbets and fruit ices. Honey, jam, marmalade, jelly, and syrups. Meringues and gelatins. Pure sugar candy, such as hard candy, jelly beans, gumdrops, mints, marshmallows, and small amounts of dark chocolate. [employer] Eat all sweets and desserts in moderation. Fats and Oils Nonhydrogenated (trans-free) margarines. Vegetable oils, including soybean, sesame, sunflower, olive, peanut, safflower, corn, canola, and cottonseed. Salad dressings or mayonnaise that are made with a vegetable oil. Limit added fats and oils that you use for cooking, baking, salads, and as spreads. Other Cocoa powder. Coffee and tea. All seasonings and condiments. The items listed above may not be a complete list of recommended foods or beverages. Contact your dietitian for more options. What foods are not recommended? Grains Breads that are made with saturated or trans fats, oils, or whole milk. Croissants. Butter rolls. Cheese breads. Sweet rolls. Donuts. Buttered popcorn. Chow mein noodles. High-fat crackers, such as cheese or butter crackers. Meats and Other Protein Sources Fatty meats, such as hotdogs, short ribs, sausage, spareribs, bacon, ribeye roast or steak, and mutton.  High-fat deli meats, such as salami and bologna. Caviar. Domestic duck and goose. Organ meats, such as kidney, liver, sweetbreads, brains, gizzard, chitterlings, and heart. Dairy Cream, sour cream, cream cheese, and creamed cottage cheese. Whole milk cheeses, including blue (bleu), Monte[place] Jack, [place], [place], [place], [place], Swiss, [place], [place], and [place]. Whole or 2% milk that is liquid, evaporated, or condensed. Whole buttermilk. Cream sauce or high-fat cheese sauce. Yogurt that is made from whole milk. Beverages Regular sodas and drinks with added sugar. Sweets and Desserts Frosting. Pudding. Cookies. Cakes other than angel food cake. Candy that has milk chocolate or white chocolate, hydrogenated fat, butter, coconut, or unknown ingredients. Buttered syrups. Full-fat ice cream or ice cream drinks. Fats and Oils Gravy that has suet,  meat fat, or shortening. Cocoa butter, hydrogenated oils, palm oil, coconut oil, palm kernel oil. These can often be found in baked products, candy, fried foods, nondairy creamers, and whipped toppings. Solid fats and shortenings, including bacon fat, salt pork, lard, and butter. Nondairy cream substitutes, such as coffee creamers and sour cream substitutes. Salad dressings that are made of unknown oils, cheese, or sour cream. The items listed above may not be a complete list of foods and beverages to avoid. Contact your dietitian for more information. This information is not intended to replace advice given to you by your health care provider. Make sure you discuss any questions you have with your health care provider. Document Released: 04/02/2008 Document Revised: 01/12/2016 Document Reviewed: 12/16/2013 Elsevier Interactive Patient Education  2017 [employer].  Continue all medications as directed. We will call when lab results are available. Please schedule complete physical this winter. Please follow-up with cards as directed.

## 2017-03-03 NOTE — Progress Notes (Signed)
Subjective:    Patient ID: Joel Ford, male    DOB: [birth year], 66 y.o.   MRN: [id number]  HPI:  Mr. Carchi is here for f/u: HTN, HL,hypothyroidism, gout, and Afib.   He is compliant on all medications and denies SE.  He walk several miles a day and tries to follow heart healthy diet.  He denies tobacco use and very rarely drinks EOTH. He was hospitalized in Jan 2018 due to A fib with RVR.  We was treated with diltiazem and xarelto.  Treadmill stress test: negative.  Last cards f/u was 09/2016, next appt is 03/2017. Of Note:  He did NOT take his anti-hypertensives this am and both BP checks were slightly elevated this am/ He has hx of gout, he estimates a flare up every 3 months.  He has successfully taken colchicine to treat sx's. He also tries to avoid foods high in uric acid.  Patient Care Team    Relationship Specialty Notifications Start End  William Hamburger D, NP PCP - General Family Medicine  08/27/16   Iran Ouch, MD Consulting Physician Cardiology  09/03/16     Patient Active Problem List   Diagnosis Date Noted  . Essential hypertension 03/03/2017  . Health care maintenance 09/03/2016  . New onset atrial fibrillation (HCC) 08/06/2016  . B12 deficiency anemia 08/06/2016  . Anemia, iron deficiency 08/06/2016  . Hypothyroidism 08/06/2016  . Paroxysmal atrial fibrillation (HCC) 08/05/2016  . Gout   . Left knee DJD   . Preop cardiovascular exam 09/06/2012  . Arthritis of knee, degenerative 08/30/2012  . EXTERNAL HEMORRHOIDS 12/26/2009  . S/P total knee replacement 08/07/2009  . HYPERGLYCEMIA 07/27/2009  . Hyperlipidemia 07/04/2009  . ABNORMAL ELECTROCARDIOGRAM 07/04/2009  . CHEST WALL PAIN, ACUTE 08/30/2008  . RENAL CALCULUS 02/15/2008  . Thoracic or lumbosacral neuritis or radiculitis 02/15/2008  . GOUT 01/22/2008     Past Medical History:  Diagnosis Date  . ABNORMAL ELECTROCARDIOGRAM 07/04/2009   Qualifier: Diagnosis of  By: Jillyn Hidden FNP, Mcarthur Rossetti   .  Atrial fibrillation (HCC)   . Gout   . HYPERGLYCEMIA 07/27/2009   Qualifier: Diagnosis of  By: Hetty Ely MD, Franne Grip   . HYPERLIPIDEMIA 07/04/2009   Qualifier: Diagnosis of  By: Jillyn Hidden FNP, Mcarthur Rossetti   . Left knee DJD    OA  . RENAL CALCULUS 02/15/2008   Qualifier: Diagnosis of  By: Jillyn Hidden FNP, Mcarthur Rossetti   . S/P total knee replacement 08/07/2009   right  . Thyroid disease      Past Surgical History:  Procedure Laterality Date  . APPENDECTOMY     age 32  . CARDIAC CATHETERIZATION  01/23/2005   Normal  . FINGER SURGERY     RIGHT INDEX, REATTACHED  . orthoscopy     Right and left knee  . TOTAL KNEE ARTHROPLASTY  2011   Right  . TOTAL KNEE ARTHROPLASTY Left 09/21/2012   Dr Thurston Hole  . TOTAL KNEE ARTHROPLASTY Left 09/21/2012   Procedure: TOTAL KNEE ARTHROPLASTY;  Surgeon: Nilda Simmer, MD;  Location: [facility] OR;  Service: Orthopedics;  Laterality: Left;     Family History  Problem Relation Age of Onset  . Stroke Father   . Healthy Mother   . Healthy Brother   . Healthy Son   . Colon cancer Neg Hx   . Prostate cancer Neg Hx      History  Drug Use No     History  Alcohol Use No  History  Smoking Status  . Never Smoker  Smokeless Tobacco  . Current User  . Types: Chew     Outpatient Encounter Prescriptions as of 03/03/2017  Medication Sig  . diltiazem (CARDIZEM CD) 240 MG 24 hr capsule Take 1 capsule (240 mg total) by mouth daily.  . ferrous sulfate (FERROUSUL) 325 (65 FE) MG tablet Take 1 tablet (325 mg total) by mouth daily with breakfast.  . levothyroxine (SYNTHROID, LEVOTHROID) 25 MCG tablet Take 1 tablet (25 mcg total) by mouth daily before breakfast.  . metoprolol tartrate (LOPRESSOR) 25 MG tablet Take 0.5 tablets (12.5 mg total) by mouth 2 (two) times daily.  . rivaroxaban (XARELTO) 20 MG TABS tablet Take 1 tablet (20 mg total) by mouth daily with supper.  . colchicine 0.6 MG tablet For Acute Flare: 1.2mg  initially, then 0.6mg  one  hour later.  Wait 12 hrs then 0.6mg  twice daily as needed.   No facility-administered encounter medications on file as of 03/03/2017.     Allergies: Patient has no known allergies.  Body mass index is 32.36 kg/m.  Blood pressure (!) 158/80, pulse 71, height 5\' 10"  (1.778 m), weight 225 lb 8 oz (102.3 kg).   Review of Systems  Constitutional: Positive for fatigue. Negative for activity change, appetite change, chills, diaphoresis, fever and unexpected weight change.  Eyes: Negative for visual disturbance.  Respiratory: Negative for cough, chest tightness, shortness of breath, wheezing and stridor.   Cardiovascular: Negative for chest pain, palpitations and leg swelling.  Neurological: Negative for tremors, weakness and headaches.  Hematological: Does not bruise/bleed easily.       Objective:   Physical Exam  Constitutional: He is oriented to person, place, and time. He appears well-developed and well-nourished. No distress.  Cardiovascular: Normal rate, regular rhythm and intact distal pulses.   Murmur heard. Pulmonary/Chest: Effort normal and breath sounds normal. No respiratory distress. He has no wheezes. He has no rales. He exhibits no tenderness.  Neurological: He is alert and oriented to person, place, and time. Coordination normal.  Skin: Skin is warm and dry. No rash noted. He is not diaphoretic. No erythema. No pallor.  Psychiatric: He has a normal mood and affect. His behavior is normal. Judgment and thought content normal.  Nursing note and vitals reviewed.         Assessment & Plan:   1. HYPERGLYCEMIA   2. Essential hypertension   3. Other hyperlipidemia   4. Hypothyroidism, unspecified type   5. Gout, unspecified cause, unspecified chronicity, unspecified site     HYPERGLYCEMIA A1c today 5.6-continue regular walking and reducing CHO/sugar in diet.   Hypothyroidism Last TSH was drawn 08/06/16- 16.5, started on levothyroxine . TSH re-drawn  today.  Essential hypertension BP slightly elevated today due to him not taking his anti-hypertensives this morning. He denies current cardiac sx's and has cards f/u 03/2017.  GOUT Colchicine 0.6mg  for gout flare ups. Advised to increase water intake and to follow "Gout Diet".    FOLLOW-UP:  Return in about 3 months (around 06/03/2017) for CPE.

## 2017-03-04 ENCOUNTER — Other Ambulatory Visit: Payer: Self-pay | Admitting: Adult Health

## 2017-03-04 DIAGNOSIS — Z79899 Other long term (current) drug therapy: Secondary | ICD-10-CM

## 2017-03-04 DIAGNOSIS — E039 Hypothyroidism, unspecified: Secondary | ICD-10-CM

## 2017-03-04 DIAGNOSIS — E7800 Pure hypercholesterolemia, unspecified: Secondary | ICD-10-CM

## 2017-03-04 LAB — COMPREHENSIVE METABOLIC PANEL
ALBUMIN: 4.4 g/dL (ref 3.6–4.8)
ALT: 12 IU/L (ref 0–44)
AST: 17 IU/L (ref 0–40)
Albumin/Globulin Ratio: 2 (ref 1.2–2.2)
Alkaline Phosphatase: 91 IU/L (ref 39–117)
BUN / CREAT RATIO: 14 (ref 10–24)
BUN: 15 mg/dL (ref 8–27)
Bilirubin Total: 0.3 mg/dL (ref 0.0–1.2)
CALCIUM: 9 mg/dL (ref 8.6–10.2)
CO2: 26 mmol/L (ref 20–29)
CREATININE: 1.04 mg/dL (ref 0.76–1.27)
Chloride: 102 mmol/L (ref 96–106)
GFR calc Af Amer: 86 mL/min/{1.73_m2} (ref >59)
GFR, EST NON AFRICAN AMERICAN: 74 mL/min/{1.73_m2} (ref >59)
GLOBULIN, TOTAL: 2.2 g/dL (ref 1.5–4.5)
Glucose: 113 mg/dL — ABNORMAL HIGH (ref 65–99)
Potassium: 5 mmol/L (ref 3.5–5.2)
SODIUM: 144 mmol/L (ref 134–144)
Total Protein: 6.6 g/dL (ref 6.0–8.5)

## 2017-03-04 LAB — LIPID PANEL
CHOL/HDL RATIO: 6 ratio — AB (ref 0.0–5.0)
Cholesterol, Total: 204 mg/dL — ABNORMAL HIGH (ref 100–199)
HDL: 34 mg/dL — ABNORMAL LOW (ref >39)
LDL CALC: 135 mg/dL — AB (ref 0–99)
Triglycerides: 173 mg/dL — ABNORMAL HIGH (ref 0–149)
VLDL Cholesterol Cal: 35 mg/dL (ref 5–40)

## 2017-03-04 LAB — TSH: TSH: 7.43 u[IU]/mL — ABNORMAL HIGH (ref 0.450–4.500)

## 2017-03-04 MED ORDER — ATORVASTATIN CALCIUM 20 MG PO TABS: 20.0000 mg | ORAL_TABLET | Freq: Every day | ORAL | 1 refills | Status: DC

## 2017-03-04 MED ORDER — LEVOTHYROXINE SODIUM 50 MCG PO TABS: 50.0000 ug | ORAL_TABLET | Freq: Every day | ORAL | 0 refills | Status: DC

## 2017-05-12 ENCOUNTER — Other Ambulatory Visit: Payer: Self-pay | Admitting: Adult Health

## 2017-05-13 ENCOUNTER — Telehealth: Payer: Self-pay | Admitting: Adult Health

## 2017-05-13 NOTE — Telephone Encounter (Signed)
Patient called states he is still in pain & that the Rx ( colchicine 0.6 tablets) worked but was given only 6 pills-- Patient request more (Refill of Rx ) since pain continues--- or ask provider/ medical assistant call him to discuss what he should do next-- [facility] .  -glh

## 2017-05-14 ENCOUNTER — Encounter: Payer: Self-pay | Admitting: Adult Health

## 2017-05-14 ENCOUNTER — Ambulatory Visit (INDEPENDENT_AMBULATORY_CARE_PROVIDER_SITE_OTHER): Payer: Medicare Other | Admitting: Adult Health

## 2017-05-14 VITALS — BP 143/77 | HR 73 | Ht 70.0 in | Wt 221.8 lb

## 2017-05-14 DIAGNOSIS — M1A0710 Idiopathic chronic gout, right ankle and foot, without tophus (tophi): Secondary | ICD-10-CM

## 2017-05-14 MED ORDER — ALLOPURINOL 100 MG PO TABS: 100.0000 mg | ORAL_TABLET | Freq: Every day | ORAL | 6 refills | Status: DC

## 2017-05-14 NOTE — Assessment & Plan Note (Addendum)
Please start once daily Allopurinol 100mg  and avoid foods high in uric acid (as listed above).   Ice, rest, and OTC Iburprofen 600-800mg  every 8 hrs with food as needed for pain control. Please follow-up in 6 weeks to evaluate medication effectiveness and obtain uric acid level. August 2018 CMP- kidney fx WNL

## 2017-05-14 NOTE — Telephone Encounter (Signed)
Good Morning Tonya, Can you please advise [name] to schedule OV for us to discuss Gout, preventing flares and how we will medically treat. Thanks! Orpha BurKaty

## 2017-05-14 NOTE — Progress Notes (Signed)
Subjective:    Patient ID: Joel Ford, male    DOB: 04-Feb-1951, 66 y.o.   MRN: [id number]  HPI: OV Notes 03/03/2017:  [name] is here for f/u: HTN, HL,hypothyroidism, gout, and Afib.   He is compliant on all medications and denies SE.  He walk several miles a day and tries to follow heart healthy diet.  He denies tobacco use and very rarely drinks EOTH. He was hospitalized in Jan 2018 due to A fib with RVR.  We was treated with diltiazem and xarelto.  Treadmill stress test: negative.  Last cards f/u was 09/2016, next appt is 03/2017. Of Note:  He did NOT take his anti-hypertensives this am and both BP checks were slightly elevated this am/ He has hx of gout, he estimates a flare up every 3 months.  He has successfully taken colchicine to treat sx's. He also tries to avoid foods high in uric acid.  Today's OV Notes 05/14/2017: [name] is here for acute gout flare-up with pain in R ankle that began over the weekend.  Pain is constant, localized over R ankle and rated 10/10 and described as "tightness and aching". He reports eating a lot a seafood last week and was initially dx'd with gout >10 years ago.  He estimates to have 5-6 gout attacks annually and has only every used Colchicine previously to treat sx's, never been tried on Allopurinol.   Patient Care Team    Relationship Specialty Notifications Start End  William Hamburgeranford, Katy D, NP PCP - General Family Medicine  08/27/16   Iran OuchArida, Muhammad A, MD Consulting Physician Cardiology  09/03/16     Patient Active Problem List   Diagnosis Date Noted  . Essential hypertension 03/03/2017  . Health care maintenance 09/03/2016  . New onset atrial fibrillation (HCC) 08/06/2016  . B12 deficiency anemia 08/06/2016  . Anemia, iron deficiency 08/06/2016  . Hypothyroidism 08/06/2016  . Paroxysmal atrial fibrillation (HCC) 08/05/2016  . Gout   . Left knee DJD   . Preop cardiovascular exam 09/06/2012  . Arthritis of knee, degenerative 08/30/2012  .  EXTERNAL HEMORRHOIDS 12/26/2009  . S/P total knee replacement 08/07/2009  . HYPERGLYCEMIA 07/27/2009  . Hyperlipidemia 07/04/2009  . ABNORMAL ELECTROCARDIOGRAM 07/04/2009  . CHEST WALL PAIN, ACUTE 08/30/2008  . RENAL CALCULUS 02/15/2008  . Thoracic or lumbosacral neuritis or radiculitis 02/15/2008  . GOUT 01/22/2008     Past Medical History:  Diagnosis Date  . ABNORMAL ELECTROCARDIOGRAM 07/04/2009   Qualifier: Diagnosis of  By: Jillyn HiddenBean FNP, Mcarthur RossettiBillie-Lynn Daniels   . Atrial fibrillation (HCC)   . Gout   . HYPERGLYCEMIA 07/27/2009   Qualifier: Diagnosis of  By: Hetty ElySchaller MD, Franne Gripobert Neal   . HYPERLIPIDEMIA 07/04/2009   Qualifier: Diagnosis of  By: Jillyn HiddenBean FNP, Mcarthur RossettiBillie-Lynn Daniels   . Left knee DJD    OA  . RENAL CALCULUS 02/15/2008   Qualifier: Diagnosis of  By: Jillyn HiddenBean FNP, Mcarthur RossettiBillie-Lynn Daniels   . S/P total knee replacement 08/07/2009   right  . Thyroid disease      Past Surgical History:  Procedure Laterality Date  . APPENDECTOMY     age 66  . CARDIAC CATHETERIZATION  01/23/2005   Normal  . FINGER SURGERY     RIGHT INDEX, REATTACHED  . orthoscopy     Right and left knee  . TOTAL KNEE ARTHROPLASTY  2011   Right  . TOTAL KNEE ARTHROPLASTY Left 09/21/2012   Dr Thurston HoleWainer     Family History  Problem Relation Age  of Onset  . Stroke Father   . Healthy Mother   . Healthy Brother   . Healthy Son   . Colon cancer Neg Hx   . Prostate cancer Neg Hx      Social History   Substance and Sexual Activity  Drug Use No     Social History   Substance and Sexual Activity  Alcohol Use No     Social History   Tobacco Use  Smoking Status Never Smoker  Smokeless Tobacco Current User  . Types: Chew     Outpatient Encounter Medications as of 05/14/2017  Medication Sig  . atorvastatin (LIPITOR) 20 MG tablet Take 1 tablet (20 mg total) by mouth daily.  . colchicine 0.6 MG tablet TAKE 2 TABLET AT THE ONSET THEN 1 TABLET 1 HOUR LATER, WAIT 12HOURS AND TAKE 1 TAB 2 TIMES AS NEEDED   . diltiazem (CARDIZEM CD) 240 MG 24 hr capsule Take 1 capsule (240 mg total) by mouth daily.  . ferrous sulfate (FERROUSUL) 325 (65 FE) MG tablet Take 1 tablet (325 mg total) by mouth daily with breakfast.  . levothyroxine (SYNTHROID, LEVOTHROID) 50 MCG tablet Take 1 tablet (50 mcg total) by mouth daily before breakfast.  . metoprolol tartrate (LOPRESSOR) 25 MG tablet Take 0.5 tablets (12.5 mg total) by mouth 2 (two) times daily.  . rivaroxaban (XARELTO) 20 MG TABS tablet Take 1 tablet (20 mg total) by mouth daily with supper.  [name] allopurinol (ZYLOPRIM) 100 MG tablet Take 1 tablet (100 mg total) daily by mouth.   No facility-administered encounter medications on file as of 05/14/2017.     Allergies: Patient has no known allergies.  Body mass index is 31.82 kg/m.  Blood pressure (!) 143/77, pulse 73, height 5\' 10"  (1.778 m), weight 221 lb 12.8 oz (100.6 kg).   Review of Systems  Constitutional: Positive for fatigue. Negative for activity change, appetite change, chills, diaphoresis, fever and unexpected weight change.  Eyes: Negative for visual disturbance.  Respiratory: Negative for cough, chest tightness, shortness of breath, wheezing and stridor.   Cardiovascular: Negative for chest pain, palpitations and leg swelling.  Neurological: Negative for tremors, weakness and headaches.  Hematological: Does not bruise/bleed easily.       Objective:   Physical Exam  Constitutional: He is oriented to person, place, and time. He appears well-developed and well-nourished. No distress.  Cardiovascular: Normal rate, regular rhythm and intact distal pulses.  Murmur heard. Pulmonary/Chest: Effort normal and breath sounds normal. No respiratory distress. He has no wheezes. He has no rales. He exhibits no tenderness.  Musculoskeletal: He exhibits edema and tenderness.       Right ankle: He exhibits swelling. He exhibits normal range of motion and normal pulse. Tenderness. Lateral malleolus and  medial malleolus tenderness found. Achilles tendon exhibits no pain.  Neurological: He is alert and oriented to person, place, and time. Coordination normal.  Skin: Skin is warm and dry. No rash noted. He is not diaphoretic. No erythema. No pallor.  Psychiatric: He has a normal mood and affect. His behavior is normal. Judgment and thought content normal.  Nursing note and vitals reviewed.         Assessment & Plan:   1. Chronic gout of right ankle, unspecified cause     GOUT Please start once daily Allopurinol 100mg  and avoid foods high in uric acid (as listed above).   Ice, rest, and OTC Iburprofen 600-800mg  every 8 hrs with food as needed for pain control. Please follow-up  in 6 weeks to evaluate medication effectiveness and obtain uric acid level. August 2018 CMP- kidney fx WNL    FOLLOW-UP:  Return in about 6 weeks (around 06/25/2017) for Regular Follow Up, Gout. 1. Chronic gout of right ankle, unspecified cause

## 2017-05-14 NOTE — Telephone Encounter (Signed)
Pt has appt today.  T. Nelson, CMA 

## 2017-05-14 NOTE — Telephone Encounter (Signed)
Please advise.  T. Charlize Hathaway, CMA 

## 2017-05-14 NOTE — Patient Instructions (Addendum)
Gout Gout is painful swelling that can occur in some of your joints. Gout is a type of arthritis. This condition is caused by having too much uric acid in your body. Uric acid is a chemical that forms when your body breaks down substances called purines. Purines are important for building body proteins. When your body has too much uric acid, sharp crystals can form and build up inside your joints. This causes pain and swelling. Gout attacks can happen quickly and be very painful (acute gout). Over time, the attacks can affect more joints and become more frequent (chronic gout). Gout can also cause uric acid to build up under your skin and inside your kidneys. What are the causes? This condition is caused by too much uric acid in your blood. This can occur because:  Your kidneys do not remove enough uric acid from your blood. This is the most common cause.  Your body makes too much uric acid. This can occur with some cancers and cancer treatments. It can also occur if your body is breaking down too many red blood cells (hemolytic anemia).  You eat too many foods that are high in purines. These foods include organ meats and some seafood. Alcohol, especially beer, is also high in purines.  A gout attack may be triggered by trauma or stress. What increases the risk? This condition is more likely to develop in people who:  Have a family history of gout.  Are male and middle-aged.  Are male and have gone through menopause.  Are obese.  Frequently drink alcohol, especially beer.  Are dehydrated.  Lose weight too quickly.  Have an organ transplant.  Have lead poisoning.  Take certain medicines, including aspirin, cyclosporine, diuretics, levodopa, and niacin.  Have kidney disease or psoriasis.  What are the signs or symptoms? An attack of acute gout happens quickly. It usually occurs in just one joint. The most common place is the big toe. Attacks often start at night. Other joints  that may be affected include joints of the feet, ankle, knee, fingers, wrist, or elbow. Symptoms may include:  Severe pain.  Warmth.  Swelling.  Stiffness.  Tenderness. The affected joint may be very painful to touch.  Shiny, red, or purple skin.  Chills and fever.  Chronic gout may cause symptoms more frequently. More joints may be involved. You may also have white or yellow lumps (tophi) on your hands or feet or in other areas near your joints. How is this diagnosed? This condition is diagnosed based on your symptoms, medical history, and physical exam. You may have tests, such as:  Blood tests to measure uric acid levels.  Removal of joint fluid with a needle (aspiration) to look for uric acid crystals.  X-rays to look for joint damage.  How is this treated? Treatment for this condition has two phases: treating an acute attack and preventing future attacks. Acute gout treatment may include medicines to reduce pain and swelling, including:  NSAIDs.  Steroids. These are strong anti-inflammatory medicines that can be taken by mouth (orally) or injected into a joint.  Colchicine. This medicine relieves pain and swelling when it is taken soon after an attack. It can be given orally or through an IV tube.  Preventive treatment may include:  Daily use of smaller doses of NSAIDs or colchicine.  Use of a medicine that reduces uric acid levels in your blood.  Changes to your diet. You may need to see a specialist about healthy eating (dietitian).  Follow these instructions at home: During a Gout Attack  If directed, apply ice to the affected area: ? Put ice in a plastic bag. ? Place a towel between your skin and the bag. ? Leave the ice on for 20 minutes, 2-3 times a day.  Rest the joint as much as possible. If the affected joint is in your leg, you may be given crutches to use.  Raise (elevate) the affected joint above the level of your heart as often as  possible.  Drink enough fluids to keep your urine clear or pale yellow.  Take over-the-counter and prescription medicines only as told by your health care provider.  Do not drive or operate heavy machinery while taking prescription pain medicine.  Follow instructions from your health care provider about eating or drinking restrictions.  Return to your normal activities as told by your health care provider. Ask your health care provider what activities are safe for you. Avoiding Future Gout Attacks  Follow a low-purine diet as told by your dietitian or health care provider. Avoid foods and drinks that are high in purines, including liver, kidney, anchovies, asparagus, herring, mushrooms, mussels, and beer.  Limit alcohol intake to no more than 1 drink a day for nonpregnant women and 2 drinks a day for men. One drink equals 12 oz of beer, 5 oz of wine, or 1 oz of hard liquor.  Maintain a healthy weight or lose weight if you are overweight. If you want to lose weight, talk with your health care provider. It is important that you do not lose weight too quickly.  Start or maintain an exercise program as told by your health care provider.  Drink enough fluids to keep your urine clear or pale yellow.  Take over-the-counter and prescription medicines only as told by your health care provider.  Keep all follow-up visits as told by your health care provider. This is important. Contact a health care provider if:  You have another gout attack.  You continue to have symptoms of a gout attack after10 days of treatment.  You have side effects from your medicines.  You have chills or a fever.  You have burning pain when you urinate.  You have pain in your lower back or belly. Get help right away if:  You have severe or uncontrolled pain.  You cannot urinate. This information is not intended to replace advice given to you by your health care provider. Make sure you discuss any questions  you have with your health care provider. Document Released: 06/21/2000 Document Revised: 11/30/2015 Document Reviewed: 04/06/2015 Elsevier Interactive Patient Education  2017 [employer]  Allopurinol tablets What is this medicine? ALLOPURINOL (al oh PURE i nole) reduces the amount of uric acid the body makes. It is used to treat the symptoms of gout. It is also used to treat or prevent high uric acid levels that occur as a result of certain types of chemotherapy. This medicine may also help patients who frequently have kidney stones. This medicine may be used for other purposes; ask your health care provider or pharmacist if you have questions. COMMON BRAND NAME(S): Zyloprim What should I tell my health care provider before I take this medicine? They need to know if you have any of these conditions: -kidney or liver disease -an unusual or allergic reaction to allopurinol, other medicines, foods, dyes, or preservatives -pregnant or trying to get pregnant -breast feeding How should I use this medicine? Take this medicine by mouth with a  glass of water. Follow the directions on the prescription label. If this medicine upsets your stomach, take it with food or milk. Take your doses at regular intervals. Do not take your medicine more often than directed. Talk to your pediatrician regarding the use of this medicine in children. Special care may be needed. While this drug may be prescribed for children as young as 6 years for selected conditions, precautions do apply. Overdosage: If you think you have taken too much of this medicine contact a poison control center or emergency room at once. NOTE: This medicine is only for you. Do not share this medicine with others. What if I miss a dose? If you miss a dose, take it as soon as you can. If it is almost time for your next dose, take only that dose. Do not take double or extra doses. What may interact with this medicine? Do not take this medicine  with the following medication: -didanosine, ddI This medicine may also interact with the following medications: -amoxicillin or ampicillin -azathioprine -certain medicines used to treat gout -certain types of diuretics -chlorpropamide -cyclosporine -dicumarol -mercaptopurine -tolbutamide -warfarin This list may not describe all possible interactions. Give your health care provider a list of all the medicines, herbs, non-prescription drugs, or dietary supplements you use. Also tell them if you smoke, drink alcohol, or use illegal drugs. Some items may interact with your medicine. What should I watch for while using this medicine? Visit your doctor or health care professional for regular checks on your progress. If you are taking this medicine to treat gout, you may not have less frequent attacks at first. Keep taking your medicine regularly and the attacks should get better within 2 to 6 weeks. Drink plenty of water (10 to 12 full glasses a day) while you are taking this medicine. This will help to reduce stomach upset and reduce the risk of getting gout or kidney stones. Call your doctor or health care professional at once if you get a skin rash together with chills, fever, sore throat, or nausea and vomiting, if you have blood in your urine, or difficulty passing urine. Do not take vitamin C without asking your doctor or health care professional. Too much vitamin C can increase the chance of getting kidney stones. You may get drowsy or dizzy. Do not drive, use machinery, or do anything that needs mental alertness until you know how this drug affects you. Do not stand or sit up quickly, especially if you are an older patient. This reduces the risk of dizzy or fainting spells. Alcohol can make you more drowsy and dizzy. Alcohol can also increase the chance of stomach problems and increase the amount of uric acid in your blood. Avoid alcoholic drinks. What side effects may I notice from receiving  this medicine? Side effects that you should report to your doctor or health care professional as soon as possible: -allergic reactions like skin rash, itching or hives, swelling of the face, lips, or tongue -breathing problems -muscle aches or pains -redness, blistering, peeling or loosening of the skin, including inside the mouth Side effects that usually do not require medical attention (report to your doctor or health care professional if they continue or are bothersome): -changes in taste -diarrhea -indigestion -stomach pain or cramps This list may not describe all possible side effects. Call your doctor for medical advice about side effects. You may report side effects to FDA at 1-800-FDA-1088. Where should I keep my medicine? Keep out of the  reach of children. Store at room temperature between 15 and 25 degrees C (59 and 77 degrees F). Protect from light and moisture. Throw away any unused medicine after the expiration date. NOTE: This sheet is a summary. It may not cover all possible information. If you have questions about this medicine, talk to your doctor, pharmacist, or health care provider.  2018 Elsevier/Gold Standard (2007-12-28 14:26:54)  Please start once daily Allopurinol 100mg  and avoid foods high in uric acid (as listed above).   OTC Iburprofen 600-800mg  every 8 hrs with food as needed for pain control. Please follow-up in 6 weeks to evaluate medication effectiveness and obtain uric acid level. FEEL BETTER!

## 2017-05-15 LAB — URIC ACID: Uric Acid: 7.1 mg/dL (ref 3.7–8.6)

## 2017-05-26 ENCOUNTER — Other Ambulatory Visit: Payer: Self-pay | Admitting: Adult Health

## 2017-05-26 ENCOUNTER — Telehealth: Payer: Self-pay | Admitting: Adult Health

## 2017-05-26 MED ORDER — ALLOPURINOL 100 MG PO TABS: ORAL_TABLET | ORAL | 6 refills | Status: DC

## 2017-05-26 NOTE — Telephone Encounter (Signed)
Pt called states he has taken the med gvn last week for gout , but now symptoms have returned wishes MA/provider to call him with instructions--forwarding message to med assistant.  -

## 2017-05-26 NOTE — Telephone Encounter (Signed)
Pt informed.  Pt expressed understanding and is agreeable.  T. Nelson, CMA  

## 2017-05-26 NOTE — Telephone Encounter (Signed)
Please advise.  T. Nelson, CMA 

## 2017-05-26 NOTE — Telephone Encounter (Signed)
Good Morning Tonya, Can you please call [name] and tell him to take Allopurinol 100mg - 2 tabs instead of just one- once daily Follow Gout Diet, he can also use ice to affected areas. Thanks! Orpha BurKaty

## 2017-05-27 ENCOUNTER — Ambulatory Visit (INDEPENDENT_AMBULATORY_CARE_PROVIDER_SITE_OTHER): Payer: Medicare Other | Admitting: Adult Health

## 2017-05-27 ENCOUNTER — Encounter: Payer: Self-pay | Admitting: Adult Health

## 2017-05-27 VITALS — BP 147/68 | HR 108 | Temp 97.6°F | Ht 70.0 in | Wt 220.7 lb

## 2017-05-27 DIAGNOSIS — M109 Gout, unspecified: Secondary | ICD-10-CM | POA: Diagnosis not present

## 2017-05-27 MED ORDER — PREDNISONE 20 MG PO TABS: ORAL_TABLET | ORAL | 0 refills | Status: DC

## 2017-05-27 MED ORDER — HYDROCODONE-ACETAMINOPHEN 5-325 MG PO TABS: 1.0000 | ORAL_TABLET | Freq: Four times a day (QID) | ORAL | 0 refills | Status: DC | PRN

## 2017-05-27 NOTE — Assessment & Plan Note (Signed)
Gout flare began Friday 05/23/17 R ankle- 10/10 warm/swollen/red Ambulating on crutches Allopurinol increased from 100mg  daily to 200mg  daily 05/26/17 Prednsione 20mg  BID 3 d, then once daily 4 d [place]-no contraindications noted. 5 d rx Vicodin provided. Follow Gout diet and Gout d/c instructions. Needs uric acid level re-check in the next few weeks F/u if sx's persist after pred course completed.

## 2017-05-27 NOTE — Patient Instructions (Signed)
Gout Gout is painful swelling that can occur in some of your joints. Gout is a type of arthritis. This condition is caused by having too much uric acid in your body. Uric acid is a chemical that forms when your body breaks down substances called purines. Purines are important for building body proteins. When your body has too much uric acid, sharp crystals can form and build up inside your joints. This causes pain and swelling. Gout attacks can happen quickly and be very painful (acute gout). Over time, the attacks can affect more joints and become more frequent (chronic gout). Gout can also cause uric acid to build up under yoLow-Purine Diet Purines are compounds that affect the level of uric acid in your body. A low-purine diet is a diet that is low in purines. Eating a low-purine diet can prevent the level of uric acid in your body from getting too high and causing gout or kidney stones or both. What do I need to know about this diet? Choose low-purine foods. Examples of low-purine foods are listed in the next section. Drink plenty of fluids, especially water. Fluids can help remove uric acid from your body. Try to drink 8-16 cups (1.9-3.8 L) a day. Limit foods high in fat, especially saturated fat, as fat makes it harder for the body to get rid of uric acid. Foods high in saturated fat include pizza, cheese, ice cream, whole milk, fried foods, and gravies. Choose foods that are lower in fat and lean sources of protein. Use olive oil when cooking as it contains healthy fats that are not high in saturated fat. Limit alcohol. Alcohol interferes with the elimination of uric acid from your body. If you are having a gout attack, avoid all alcohol. Keep in mind that different people's bodies react differently to different foods. You will probably learn over time which foods do or do not affect you. If you discover that a food tends to cause your gout to flare up, avoid eating that food. You can more freely  enjoy foods that do not cause problems. If you have any questions about a food item, talk to your dietitian or health care provider. Which foods are low, moderate, and high in purines? The following is a list of foods that are low, moderate, and high in purines. You can eat any amount of the foods that are low in purines. You may be able to have small amounts of foods that are moderate in purines. Ask your health care provider how much of a food moderate in purines you can have. Avoid foods high in purines. Grains Foods low in purines: Enriched white bread, pasta, rice, cake, cornbread, popcorn. Foods moderate in purines: Whole-grain breads and cereals, wheat germ, bran, oatmeal. Uncooked oatmeal. Dry wheat bran or wheat germ. Foods high in purines: Pancakes, [place] toast, biscuits, muffins. Vegetables Foods low in purines: All vegetables, except those that are moderate in purines. Foods moderate in purines: Asparagus, cauliflower, spinach, mushrooms, green peas. Fruits All fruits are low in purines. Meats and other Protein Foods Foods low in purines: Eggs, nuts, peanut butter. Foods moderate in purines: 80-90% lean beef, lamb, veal, pork, poultry, fish, eggs, peanut butter, nuts. Crab, lobster, oysters, and shrimp. Cooked dried beans, peas, and lentils. Foods high in purines: Anchovies, sardines, herring, mussels, tuna, codfish, scallops, trout, and haddock. Joel BlaseBacon. Organ meats (such as liver or kidney). Tripe. Game meat. Goose. Sweetbreads. Dairy All dairy foods are low in purines. Low-fat and fat-free dairy products are  best because they are low in saturated fat. Beverages Drinks low in purines: Water, carbonated beverages, tea, coffee, cocoa. Drinks moderate in purines: Soft drinks and other drinks sweetened with high-fructose corn syrup. Juices. To find whether a food or drink is sweetened with high-fructose corn syrup, look at the ingredients list. Drinks high in purines: Alcoholic  beverages (such as beer). Condiments Foods low in purines: Salt, herbs, olives, pickles, relishes, vinegar. Foods moderate in purines: Butter, margarine, oils, mayonnaise. Fats and Oils Foods low in purines: All types, except gravies and sauces made with meat. Foods high in purines: Gravies and sauces made with meat. Other Foods Foods low in purines: Sugars, sweets, gelatin. Cake. Soups made without meat. Foods moderate in purines: Meat-based or fish-based soups, broths, or bouillons. Foods and drinks sweetened with high-fructose corn syrup. Foods high in purines: High-fat desserts (such as ice cream, cookies, cakes, pies, doughnuts, and chocolate). Contact your dietitian for more information on foods that are not listed here. This information is not intended to replace advice given to you by your health care provider. Make sure you discuss any questions you have with your health care provider. Document Released: 10/19/2010 Document Revised: 11/30/2015 Document Reviewed: 05/31/2013 Elsevier Interactive Patient Education  2017 [employer]. ur skin and inside your kidneys. What are the causes? This condition is caused by too much uric acid in your blood. This can occur because:  Your kidneys do not remove enough uric acid from your blood. This is the most common cause.  Your body makes too much uric acid. This can occur with some cancers and cancer treatments. It can also occur if your body is breaking down too many red blood cells (hemolytic anemia).  You eat too many foods that are high in purines. These foods include organ meats and some seafood. Alcohol, especially beer, is also high in purines.  A gout attack may be triggered by trauma or stress. What increases the risk? This condition is more likely to develop in people who:  Have a family history of gout.  Are male and middle-aged.  Are male and have gone through menopause.  Are obese.  Frequently drink alcohol,  especially beer.  Are dehydrated.  Lose weight too quickly.  Have an organ transplant.  Have lead poisoning.  Take certain medicines, including aspirin, cyclosporine, diuretics, levodopa, and niacin.  Have kidney disease or psoriasis.  What are the signs or symptoms? An attack of acute gout happens quickly. It usually occurs in just one joint. The most common place is the big toe. Attacks often start at night. Other joints that may be affected include joints of the feet, ankle, knee, fingers, wrist, or elbow. Symptoms may include:  Severe pain.  Warmth.  Swelling.  Stiffness.  Tenderness. The affected joint may be very painful to touch.  Shiny, red, or purple skin.  Chills and fever.  Chronic gout may cause symptoms more frequently. More joints may be involved. You may also have white or yellow lumps (tophi) on your hands or feet or in other areas near your joints. How is this diagnosed? This condition is diagnosed based on your symptoms, medical history, and physical exam. You may have tests, such as:  Blood tests to measure uric acid levels.  Removal of joint fluid with a needle (aspiration) to look for uric acid crystals.  X-rays to look for joint damage.  How is this treated? Treatment for this condition has two phases: treating an acute attack and preventing future attacks.  Acute gout treatment may include medicines to reduce pain and swelling, including:  NSAIDs.  Steroids. These are strong anti-inflammatory medicines that can be taken by mouth (orally) or injected into a joint.  Colchicine. This medicine relieves pain and swelling when it is taken soon after an attack. It can be given orally or through an IV tube.  Preventive treatment may include:  Daily use of smaller doses of NSAIDs or colchicine.  Use of a medicine that reduces uric acid levels in your blood.  Changes to your diet. You may need to see a specialist about healthy eating  (dietitian).  Follow these instructions at home: During a Gout Attack  If directed, apply ice to the affected area: ? Put ice in a plastic bag. ? Place a towel between your skin and the bag. ? Leave the ice on for 20 minutes, 2-3 times a day.  Rest the joint as much as possible. If the affected joint is in your leg, you may be given crutches to use.  Raise (elevate) the affected joint above the level of your heart as often as possible.  Drink enough fluids to keep your urine clear or pale yellow.  Take over-the-counter and prescription medicines only as told by your health care provider.  Do not drive or operate heavy machinery while taking prescription pain medicine.  Follow instructions from your health care provider about eating or drinking restrictions.  Return to your normal activities as told by your health care provider. Ask your health care provider what activities are safe for you. Avoiding Future Gout Attacks  Follow a low-purine diet as told by your dietitian or health care provider. Avoid foods and drinks that are high in purines, including liver, kidney, anchovies, asparagus, herring, mushrooms, mussels, and beer.  Limit alcohol intake to no more than 1 drink a day for nonpregnant women and 2 drinks a day for men. One drink equals 12 oz of beer, 5 oz of wine, or 1 oz of hard liquor.  Maintain a healthy weight or lose weight if you are overweight. If you want to lose weight, talk with your health care provider. It is important that you do not lose weight too quickly.  Start or maintain an exercise program as told by your health care provider.  Drink enough fluids to keep your urine clear or pale yellow.  Take over-the-counter and prescription medicines only as told by your health care provider.  Keep all follow-up visits as told by your health care provider. This is important. Contact a health care provider if:  You have another gout attack.  You continue to  have symptoms of a gout attack after10 days of treatment.  You have side effects from your medicines.  You have chills or a fever.  You have burning pain when you urinate.  You have pain in your lower back or belly. Get help right away if:  You have severe or uncontrolled pain.  You cannot urinate. This information is not intended to replace advice given to you by your health care provider. Make sure you discuss any questions you have with your health care provider. Document Released: 06/21/2000 Document Revised: 11/30/2015 Document Reviewed: 04/06/2015 Elsevier Interactive Patient Education  2017 Else[employer] Inc.  Please take medications as directed and follow discharge instructions. Please schedule lab appt for uric acid level check. If symptoms do not improve after prednisone complete, please call clinic. FEEL BETTER!

## 2017-05-27 NOTE — Progress Notes (Signed)
Subjective:    Patient ID: Joel Ford, male    DOB: 1950-12-31, 66 y.o.   MRN: [id number]  HPI: OV Notes 03/03/2017:  Joel Ford is here for f/u: HTN, HL,hypothyroidism, gout, and Afib.   He is compliant on all medications and denies SE.  He walk several miles a day and tries to follow heart healthy diet.  He denies tobacco use and very rarely drinks EOTH. He was hospitalized in Jan 2018 due to A fib with RVR.  We was treated with diltiazem and xarelto.  Treadmill stress test: negative.  Last cards f/u was 09/2016, next appt is 03/2017. Of Note:  He did NOT take his anti-hypertensives this am and both BP checks were slightly elevated this am/ He has hx of gout, he estimates a flare up every 3 months.  He has successfully taken colchicine to treat sx's. He also tries to avoid foods high in uric acid.  Today's OV Notes 05/14/2017: Joel Ford is here for acute gout flare-up with pain in R ankle that began over the weekend.  Pain is constant, localized over R ankle and rated 10/10 and described as "tightness and aching". He reports eating a lot a seafood last week and was initially dx'd with gout >10 years ago.  He estimates to have 5-6 gout attacks annually and has only every used Colchicine previously to treat sx's, never been tried on Allopurinol.  05/27/17 OV: Joel Ford is here for continued issues with Gout.  He was started on Allopurinol 100mg  daily 05/14/17 and increased to 200mg  daily 05/26/17.  Last course course of cholchicine 0.6mg  on > 3 weeks ago.  He reports gout sx's initially improved after starting allopurinol, then R ankle swelling/pain/warmth developed Friday 05/23/17.  He reports constant, throbbing pain R ankle that radiates to all all toes.  Pain rated 10/10 and he has been unable to sleep due to discomfort.  He has been ambulating with assistance of crutches.  He has been trying to follow gout diet and estimates to drink 40-50 oz water/day.  Patient Care Team    Relationship  Specialty Notifications Start End  William Hamburgeranford, Katy D, NP PCP - General Family Medicine  08/27/16   Iran OuchArida, Muhammad A, MD Consulting Physician Cardiology  09/03/16     Patient Active Problem List   Diagnosis Date Noted  . Essential hypertension 03/03/2017  . Health care maintenance 09/03/2016  . New onset atrial fibrillation (HCC) 08/06/2016  . B12 deficiency anemia 08/06/2016  . Anemia, iron deficiency 08/06/2016  . Hypothyroidism 08/06/2016  . Paroxysmal atrial fibrillation (HCC) 08/05/2016  . Gout   . Left knee DJD   . Preop cardiovascular exam 09/06/2012  . Arthritis of knee, degenerative 08/30/2012  . EXTERNAL HEMORRHOIDS 12/26/2009  . S/P total knee replacement 08/07/2009  . HYPERGLYCEMIA 07/27/2009  . Hyperlipidemia 07/04/2009  . ABNORMAL ELECTROCARDIOGRAM 07/04/2009  . CHEST WALL PAIN, ACUTE 08/30/2008  . RENAL CALCULUS 02/15/2008  . Thoracic or lumbosacral neuritis or radiculitis 02/15/2008  . GOUT 01/22/2008     Past Medical History:  Diagnosis Date  . ABNORMAL ELECTROCARDIOGRAM 07/04/2009   Qualifier: Diagnosis of  By: Jillyn HiddenBean FNP, Mcarthur RossettiBillie-Lynn Daniels   . Atrial fibrillation (HCC)   . Gout   . HYPERGLYCEMIA 07/27/2009   Qualifier: Diagnosis of  By: Hetty ElySchaller MD, Franne Gripobert Neal   . HYPERLIPIDEMIA 07/04/2009   Qualifier: Diagnosis of  By: Jillyn HiddenBean FNP, Mcarthur RossettiBillie-Lynn Daniels   . Left knee DJD    OA  . RENAL CALCULUS 02/15/2008  Qualifier: Diagnosis of  By: Jillyn Hidden FNP, Mcarthur Rossetti   . S/P total knee replacement 08/07/2009   right  . Thyroid disease      Past Surgical History:  Procedure Laterality Date  . APPENDECTOMY     age 72  . CARDIAC CATHETERIZATION  01/23/2005   Normal  . FINGER SURGERY     RIGHT INDEX, REATTACHED  . orthoscopy     Right and left knee  . TOTAL KNEE ARTHROPLASTY  2011   Right  . TOTAL KNEE ARTHROPLASTY Left 09/21/2012   Dr Thurston Hole  . TOTAL KNEE ARTHROPLASTY Left 09/21/2012   Performed by Nilda Simmer, MD at [facility] OR     Family History   Problem Relation Age of Onset  . Stroke Father   . Healthy Mother   . Healthy Brother   . Healthy Son   . Colon cancer Neg Hx   . Prostate cancer Neg Hx      Social History   Substance and Sexual Activity  Drug Use No     Social History   Substance and Sexual Activity  Alcohol Use No     Social History   Tobacco Use  Smoking Status Never Smoker  Smokeless Tobacco Current User  . Types: Chew     Outpatient Encounter Medications as of 05/27/2017  Medication Sig  . allopurinol (ZYLOPRIM) 100 MG tablet 2 tabs by mouth once daily  . atorvastatin (LIPITOR) 20 MG tablet Take 1 tablet (20 mg total) by mouth daily.  . colchicine 0.6 MG tablet TAKE 2 TABLET AT THE ONSET THEN 1 TABLET 1 HOUR LATER, WAIT 12HOURS AND TAKE 1 TAB 2 TIMES AS NEEDED  . diltiazem (CARDIZEM CD) 240 MG 24 hr capsule Take 1 capsule (240 mg total) by mouth daily.  . ferrous sulfate (FERROUSUL) 325 (65 FE) MG tablet Take 1 tablet (325 mg total) by mouth daily with breakfast.  . levothyroxine (SYNTHROID, LEVOTHROID) 50 MCG tablet Take 1 tablet (50 mcg total) by mouth daily before breakfast.  . metoprolol tartrate (LOPRESSOR) 25 MG tablet Take 0.5 tablets (12.5 mg total) by mouth 2 (two) times daily.  . rivaroxaban (XARELTO) 20 MG TABS tablet Take 1 tablet (20 mg total) by mouth daily with supper.   No facility-administered encounter medications on file as of 05/27/2017.     Allergies: Patient has no known allergies.  There is no height or weight on file to calculate BMI.  There were no vitals taken for this visit.   Review of Systems  Constitutional: Positive for fatigue. Negative for activity change, appetite change, chills, diaphoresis, fever and unexpected weight change.  Eyes: Negative for visual disturbance.  Respiratory: Negative for cough, chest tightness, shortness of breath, wheezing and stridor.   Cardiovascular: Negative for chest pain, palpitations and leg swelling.  Musculoskeletal:  Positive for arthralgias, joint swelling and myalgias.  Neurological: Negative for tremors, weakness and headaches.  Hematological: Does not bruise/bleed easily.  Psychiatric/Behavioral: Positive for sleep disturbance.       Objective:   Physical Exam  Constitutional: He is oriented to person, place, and time. He appears well-developed and well-nourished. No distress.  Cardiovascular: Normal rate, regular rhythm and intact distal pulses.  Murmur heard. Pulmonary/Chest: Effort normal and breath sounds normal. No respiratory distress. He has no wheezes. He has no rales. He exhibits no tenderness.  Musculoskeletal: He exhibits edema and tenderness.       Right ankle: He exhibits decreased range of motion and swelling. He exhibits normal  pulse. Tenderness. Lateral malleolus and medial malleolus tenderness found. Achilles tendon exhibits no pain.  Excessively reddened, warm, with 2+ edema  Neurological: He is alert and oriented to person, place, and time. Coordination normal.  Skin: Skin is warm and dry. No rash noted. He is not diaphoretic. No erythema. No pallor.  Psychiatric: He has a normal mood and affect. His behavior is normal. Judgment and thought content normal.  Nursing note and vitals reviewed.         Assessment & Plan:   1. Gout, unspecified cause, unspecified chronicity, unspecified site     GOUT Gout flare began Friday 05/23/17 R ankle- 10/10 warm/swollen/red Ambulating on crutches Allopurinol increased from 100mg  daily to 200mg  daily 05/26/17 Prednsione 20mg  BID 3 d, then once daily 4 d [place]  Controlled Substance Database reviewed-no contraindications noted. 5 d rx Vicodin provided. Follow Gout diet and Gout d/c instructions. Needs uric acid level re-check in the next few weeks F/u if sx's persist after pred course completed.    FOLLOW-UP:  Return if symptoms worsen or fail to improve. No diagnosis found.

## 2017-06-03 ENCOUNTER — Other Ambulatory Visit: Payer: Self-pay | Admitting: Adult Health

## 2017-06-03 ENCOUNTER — Encounter: Payer: Medicare Other | Admitting: Adult Health

## 2017-06-03 MED ORDER — PREDNISONE 20 MG PO TABS: ORAL_TABLET | ORAL | 0 refills | Status: DC

## 2017-06-03 NOTE — Telephone Encounter (Signed)
Morning Tonya, He can have one refill only on prednisone. He needs to push, push, push water and strictly follow Gout diet. He also needs to come in 1-2 weeks to have his Uric Acid level checked to see if he is on correct dose of Allopurinol. Thanks! Orpha BurKaty

## 2017-06-03 NOTE — Telephone Encounter (Signed)
Please review and refill if appropriate.  T. Kymberley Raz, CMA  

## 2017-06-03 NOTE — Telephone Encounter (Signed)
Pt called states has completed Rx of prednisone but there is still pain & swelling ---Pt request refill of predniSone 20 MG --Takes 1 tablet every 12hrs for (3 days).--- [facility] [place]  --glh

## 2017-06-04 NOTE — Telephone Encounter (Signed)
Pt informed.  Pt expressed understanding and is agreeable.  Pt states he will call back to schedule lab appointment after he checks his schedule.  Tiajuana Amass. Leory Allinson, CMA

## 2017-06-09 ENCOUNTER — Other Ambulatory Visit: Payer: Self-pay | Admitting: Adult Health

## 2017-06-11 ENCOUNTER — Telehealth: Payer: Self-pay | Admitting: Adult Health

## 2017-06-11 DIAGNOSIS — M10071 Idiopathic gout, right ankle and foot: Secondary | ICD-10-CM

## 2017-06-11 NOTE — Telephone Encounter (Signed)
Patient would like to talk about his prednisone prescription

## 2017-06-11 NOTE — Telephone Encounter (Signed)
Pt states that he began to have a gout flare up last night.  Pt states that he ate BBQ yesterday afternoon, prior to the flare up.  Pt states that he is taking the allopurinol and colchicine.  Joel Ford. Joel Ford, CMA

## 2017-06-11 NOTE — Telephone Encounter (Signed)
Afternoon tonya, Can you please tell Ms. Kleeman that he NEEDS to adhere to a gout diet and continue medication.  Also can you please put in referral to Rheumatology. Thanks! Orpha BurKaty

## 2017-06-12 NOTE — Telephone Encounter (Signed)
Advised pt of recommendations and referral.  Explained to pt risk/ SEs of recurrent treatments with predisone.  Pt expressed understanding and is agreeable.  Referral to rheumatology placed.  Tiajuana Amass. Nelson, CMA

## 2017-06-12 NOTE — Addendum Note (Signed)
Addended by: Stan HeadNELSON, Alessandria Henken S on: 06/12/2017 08:18 AM   Modules accepted: Orders

## 2017-06-13 ENCOUNTER — Emergency Department
Admission: EM | Admit: 2017-06-13 | Discharge: 2017-06-13 | Disposition: A | Discharge status: Home / Self Care | Payer: Medicare Other | Source: Home / Self Care | Attending: Family Medicine | Admitting: Family Medicine

## 2017-06-13 ENCOUNTER — Other Ambulatory Visit: Payer: Self-pay

## 2017-06-13 DIAGNOSIS — M10071 Idiopathic gout, right ankle and foot: Secondary | ICD-10-CM

## 2017-06-13 MED ORDER — HYDROCODONE-ACETAMINOPHEN 5-325 MG PO TABS: 1.0000 | ORAL_TABLET | Freq: Four times a day (QID) | ORAL | 0 refills | Status: DC | PRN

## 2017-06-13 MED ORDER — PREDNISONE 20 MG PO TABS: ORAL_TABLET | ORAL | 0 refills | Status: DC

## 2017-06-13 NOTE — ED Triage Notes (Signed)
Has had a problem with gout last couple of weeks.  Cleared up last week, then flared up again Wednesday night.  Pain in the right foot.

## 2017-06-13 NOTE — Discharge Instructions (Addendum)
Increase fluid intake. °If symptoms become significantly worse during the night or over the weekend, proceed to the local emergency room.  °

## 2017-06-13 NOTE — ED Provider Notes (Signed)
[facility]    CSN: [id number] Arrival date & time: 06/13/17  0850     History   Chief Complaint Chief Complaint  Patient presents with  . Gout    HPI Joel Ford is a 66 y.o. male.   Patient presents for an acute flare-up of gout in his right ankle 3 days ago.  He had had a similar flare-up 17 days ago that was effectively treated with a prednisone taper.  His allopurinol had been increased to 200mg  daily on 05/26/17.   The history is provided by the patient.  Ankle Pain  Location:  Ankle Time since incident:  3 days Injury: no   Ankle location:  R ankle Pain details:    Quality:  Aching   Radiates to:  Does not radiate   Severity:  Moderate   Onset quality:  Sudden   Duration:  3 days   Timing:  Constant   Progression:  Unchanged Chronicity:  Recurrent Prior injury to area:  No Relieved by:  Nothing Worsened by:  Bearing weight Ineffective treatments:  None tried Associated symptoms: decreased ROM, stiffness and swelling   Associated symptoms: no fatigue, no fever, no muscle weakness, no numbness and no tingling     Past Medical History:  Diagnosis Date  . ABNORMAL ELECTROCARDIOGRAM 07/04/2009   Qualifier: Diagnosis of  By: Jillyn HiddenBean FNP, Mcarthur RossettiBillie-Lynn Daniels   . Atrial fibrillation (HCC)   . Gout   . HYPERGLYCEMIA 07/27/2009   Qualifier: Diagnosis of  By: Hetty ElySchaller MD, Franne Gripobert Neal   . HYPERLIPIDEMIA 07/04/2009   Qualifier: Diagnosis of  By: Jillyn HiddenBean FNP, Mcarthur RossettiBillie-Lynn Daniels   . Left knee DJD    OA  . RENAL CALCULUS 02/15/2008   Qualifier: Diagnosis of  By: Jillyn HiddenBean FNP, Mcarthur RossettiBillie-Lynn Daniels   . S/P total knee replacement 08/07/2009   right  . Thyroid disease     Patient Active Problem List   Diagnosis Date Noted  . Essential hypertension 03/03/2017  . Health care maintenance 09/03/2016  . New onset atrial fibrillation (HCC) 08/06/2016  . B12 deficiency anemia 08/06/2016  . Anemia, iron deficiency 08/06/2016  . Hypothyroidism 08/06/2016  .  Paroxysmal atrial fibrillation (HCC) 08/05/2016  . Gout   . Left knee DJD   . Preop cardiovascular exam 09/06/2012  . Arthritis of knee, degenerative 08/30/2012  . EXTERNAL HEMORRHOIDS 12/26/2009  . S/P total knee replacement 08/07/2009  . HYPERGLYCEMIA 07/27/2009  . Hyperlipidemia 07/04/2009  . ABNORMAL ELECTROCARDIOGRAM 07/04/2009  . CHEST WALL PAIN, ACUTE 08/30/2008  . RENAL CALCULUS 02/15/2008  . Thoracic or lumbosacral neuritis or radiculitis 02/15/2008  . GOUT 01/22/2008    Past Surgical History:  Procedure Laterality Date  . APPENDECTOMY     age 66  . CARDIAC CATHETERIZATION  01/23/2005   Normal  . FINGER SURGERY     RIGHT INDEX, REATTACHED  . orthoscopy     Right and left knee  . TOTAL KNEE ARTHROPLASTY  2011   Right  . TOTAL KNEE ARTHROPLASTY Left 09/21/2012   Dr Thurston HoleWainer  . TOTAL KNEE ARTHROPLASTY Left 09/21/2012   Procedure: TOTAL KNEE ARTHROPLASTY;  Surgeon: Nilda Simmerobert A Wainer, MD;  Location: [facility] OR;  Service: Orthopedics;  Laterality: Left;       Home Medications    Prior to Admission medications   Medication Sig Start Date End Date Taking? Authorizing Provider  allopurinol (ZYLOPRIM) 100 MG tablet 2 tabs by mouth once daily 05/26/17   Danford, Orpha BurKaty D, NP  atorvastatin (LIPITOR) 20 MG tablet  Take 1 tablet (20 mg total) by mouth daily. 03/04/17   Danford, Orpha BurKaty D, NP  colchicine 0.6 MG tablet TAKE 2 TABLET AT THE ONSET THEN 1 TABLET 1 HOUR LATER, WAIT 12HOURS AND TAKE 1 TAB 2 TIMES AS NEEDED 06/09/17   Danford, Orpha BurKaty D, NP  diltiazem (CARDIZEM CD) 240 MG 24 hr capsule Take 1 capsule (240 mg total) by mouth daily. 09/05/16   Iran OuchArida, Muhammad A, MD  ferrous sulfate (FERROUSUL) 325 (65 FE) MG tablet Take 1 tablet (325 mg total) by mouth daily with breakfast. 12/27/16   Danford, Orpha BurKaty D, NP  HYDROcodone-acetaminophen (NORCO/VICODIN) 5-325 MG tablet Take 1 tablet by mouth every 6 (six) hours as needed for moderate pain. 06/13/17   Lattie HawBeese, Ksenia Kunz A, MD  levothyroxine (SYNTHROID,  LEVOTHROID) 50 MCG tablet Take 1 tablet (50 mcg total) by mouth daily before breakfast. 03/04/17   Danford, Orpha BurKaty D, NP  metoprolol tartrate (LOPRESSOR) 25 MG tablet Take 0.5 tablets (12.5 mg total) by mouth 2 (two) times daily. 09/05/16   Iran OuchArida, Muhammad A, MD  predniSONE (DELTASONE) 20 MG tablet Take one tab PO BID for 5 days, then one daily for 4 days.  Take PC. 06/13/17   Lattie HawBeese, Coley Kulikowski A, MD  rivaroxaban (XARELTO) 20 MG TABS tablet Take 1 tablet (20 mg total) by mouth daily with supper. 09/05/16   Iran OuchArida, Muhammad A, MD    Family History Family History  Problem Relation Age of Onset  . Stroke Father   . Healthy Mother   . Healthy Brother   . Healthy Son   . Colon cancer Neg Hx   . Prostate cancer Neg Hx     Social History Social History   Tobacco Use  . Smoking status: Never Smoker  . Smokeless tobacco: Current User    Types: Chew  Substance Use Topics  . Alcohol use: No  . Drug use: No     Allergies   Patient has no known allergies.   Review of Systems Review of Systems  Constitutional: Negative for fatigue and fever.  Musculoskeletal: Positive for stiffness.  All other systems reviewed and are negative.    Physical Exam Triage Vital Signs ED Triage Vitals  Enc Vitals Group     BP 06/13/17 0950 116/80     Pulse Rate 06/13/17 0950 (!) 115     Resp --      Temp 06/13/17 0950 98 F (36.7 C)     Temp Source 06/13/17 0950 Oral     SpO2 06/13/17 0950 96 %     Weight 06/13/17 0951 217 lb (98.4 kg)     Height 06/13/17 0951 5\' 10"  (1.778 m)     Head Circumference --      Peak Flow --      Pain Score 06/13/17 0951 10     Pain Loc --      Pain Edu? --      Excl. in GC? --    No data found.  Updated Vital Signs BP 116/80 (BP Location: Right Arm)   Pulse (!) 115   Temp 98 F (36.7 C) (Oral)   Ht 5\' 10"  (1.778 m)   Wt 217 lb (98.4 kg)   SpO2 96%   BMI 31.14 kg/m   Visual Acuity Right Eye Distance:   Left Eye Distance:   Bilateral Distance:    Right Eye  Near:   Left Eye Near:    Bilateral Near:     Physical Exam  Constitutional: He appears  well-developed and well-nourished. No distress.  HENT:  Head: Normocephalic.  Eyes: Conjunctivae are normal. Pupils are equal, round, and reactive to light.  Cardiovascular: Normal rate.  Pulmonary/Chest: Effort normal.  Musculoskeletal: He exhibits no edema.       Right ankle: He exhibits decreased range of motion and swelling. He exhibits no ecchymosis, no deformity, no laceration and normal pulse. Tenderness.       Feet:  Right ankle has diffuse mild tenderness to palpation with erythema and warmth.  No calf tenderness  Neurological: He is alert.  Skin: Skin is warm and dry.  Nursing note and vitals reviewed.    UC Treatments / Results  Labs (all labs ordered are listed, but only abnormal results are displayed) Labs Reviewed - No data to display  EKG  EKG Interpretation None       Radiology No results found.  Procedures Procedures (including critical care time)  Medications Ordered in UC Medications - No data to display   Initial Impression / Assessment and Plan / UC Course  I have reviewed the triage vital signs and the nursing notes.  Pertinent labs & imaging results that were available during my care of the patient were reviewed by me and considered in my medical decision making (see chart for details).    Begin prednisone burst/taper. Rx for Lortab. Controlled Substance Prescriptions I have consulted the [place] Controlled Substances Registry for this patient, and feel the risk/benefit ratio today is favorable for proceeding with this prescription for a controlled substance.  Increase fluid intake. If symptoms become significantly worse during the night or over the weekend, proceed to the local emergency room.  Followup with Family Doctor for gout management.    Final Clinical Impressions(s) / UC Diagnoses   Final diagnoses:  Acute idiopathic gout of right ankle     ED Discharge Orders        Ordered    predniSONE (DELTASONE) 20 MG tablet     06/13/17 1020    HYDROcodone-acetaminophen (NORCO/VICODIN) 5-325 MG tablet  Every 6 hours PRN     06/13/17 1020          Lattie Haw, MD 06/13/17 1236

## 2017-07-14 ENCOUNTER — Telehealth: Payer: Self-pay | Admitting: Adult Health

## 2017-07-14 DIAGNOSIS — M1000 Idiopathic gout, unspecified site: Secondary | ICD-10-CM

## 2017-07-14 MED ORDER — PREDNISONE 20 MG PO TABS: ORAL_TABLET | ORAL | 0 refills | Status: DC

## 2017-07-14 NOTE — Telephone Encounter (Signed)
Pt called states his GOUT has flared up again & he request a refill on:  predniSONE (DELTASONE) 20 MG tablet [[id number]][[id number]]  Order Details  Dose, Route, Frequency: As Directed   Dispense Quantity: 14 tablet Refills: 0 Fills remaining: --        Sig: Take one tab PO BID for 5 days, then one daily for 4 days. Take PC.          Patient [facility]: [facility] [id number] [place] [facility] [address] DEA #:  [id number]   ---glh

## 2017-07-14 NOTE — Telephone Encounter (Signed)
Good Afternoon Tonya, Please send in only one RF on prednisone taper and referral to Rheumatology, re: Gout with recurrent flares. Thanks! Orpha BurKaty

## 2017-07-14 NOTE — Telephone Encounter (Signed)
Pt informed.  T. Darrick Greenlaw, CMA 

## 2017-07-21 ENCOUNTER — Other Ambulatory Visit: Payer: Self-pay

## 2017-07-21 MED ORDER — ALLOPURINOL 100 MG PO TABS: ORAL_TABLET | ORAL | 0 refills | Status: DC

## 2017-07-21 NOTE — Telephone Encounter (Signed)
Pt needs refill on allopurinol taking 2 tablets daily, #180.  Refill sent to pharmacy.  Tiajuana Amass. Lateefa Crosby, CMA

## 2017-08-19 ENCOUNTER — Telehealth: Payer: Self-pay | Admitting: Adult Health

## 2017-08-19 NOTE — Telephone Encounter (Signed)
Please review for appropriateness of refill.  T. Nelson, CMA 

## 2017-08-19 NOTE — Telephone Encounter (Signed)
Patient is requesting a refill of the prednisone, he does not have an appt with the specialists for the gout until 09/09/17 and was hoping for a refill. [facility] [address]

## 2017-08-20 ENCOUNTER — Other Ambulatory Visit: Payer: Self-pay | Admitting: Adult Health

## 2017-08-20 MED ORDER — PREDNISONE 20 MG PO TABS: ORAL_TABLET | ORAL | 0 refills | Status: DC

## 2017-08-20 NOTE — Telephone Encounter (Signed)
Good Morning Tonya, Please let [name] know I sent in one FINAL refill on Prednisone.  He needs to follow Gout diet and stay as hydrated as possible. I am glad that he has appt with specialist to address his frequent Gout Flares. Thanks! Orpha BurKaty

## 2017-08-20 NOTE — Telephone Encounter (Signed)
Pt informed.  Pt expressed understanding and is agreeable.  T. Syra Sirmons, CMA  

## 2017-09-09 DIAGNOSIS — M1009 Idiopathic gout, multiple sites: Secondary | ICD-10-CM | POA: Diagnosis not present

## 2017-09-09 DIAGNOSIS — M150 Primary generalized (osteo)arthritis: Secondary | ICD-10-CM | POA: Diagnosis not present

## 2017-09-09 LAB — CBC AND DIFFERENTIAL
HEMATOCRIT: 35 — AB (ref 41–53)
Hemoglobin: 10.4 — AB (ref 13.5–17.5)
Neutrophils Absolute: 4
Platelets: 139 — AB (ref 150–399)
WBC: 5.4

## 2017-09-09 LAB — BASIC METABOLIC PANEL
BUN: 12 (ref 4–21)
CREATININE: 1 (ref <=1.3)
GLUCOSE: 106
Potassium: 4.3 (ref 3.4–5.3)
Sodium: 142 (ref 137–147)

## 2017-09-09 LAB — HEPATIC FUNCTION PANEL
ALT: 9 — AB (ref 10–40)
AST: 13 — AB (ref 14–40)
Alkaline Phosphatase: 104 (ref 25–125)
BILIRUBIN, TOTAL: 3

## 2017-09-09 LAB — C-REACTIVE PROTEIN: CRP: 2.4

## 2017-09-09 LAB — URIC ACID: URIC ACID: 5.6

## 2017-09-09 LAB — SEDIMENTATION RATE: SED RATE: 10

## 2017-09-16 LAB — RHEUMATOID ARTHRITIS DIAGNOSTIC PANEL

## 2017-09-29 ENCOUNTER — Telehealth: Payer: Self-pay | Admitting: Adult Health

## 2017-09-29 ENCOUNTER — Other Ambulatory Visit: Payer: Self-pay | Admitting: Cardiovascular Disease

## 2017-09-29 NOTE — Telephone Encounter (Signed)
Pt's wife called states they went to rheumatologists last week & was told he was anemic,advised him to contact his PCP for treatment or  Rx--forwarding message to medical assistant. --Fausto Skillernglh

## 2017-10-01 NOTE — Telephone Encounter (Signed)
Patient was last seen in office on 05/27/2018.  Please advise. MPulliam, CMA/RT(R)

## 2017-10-01 NOTE — Telephone Encounter (Signed)
Good Afternoon Melissa, These are notes from: 09/26/2016 at 4:58 PM EDT "Good Afternoon Tonya, Can you please cal [name] and share that his hemoglobin/hematocrit are stable at 13.0/40.6 This is improved from the last 7 years. Iron deficiency remains, would recommend referral to GI for work up to see if they can detect source of deficiency. Would he be interested? Thanks! Mehdi Gironda "  Pt never responded to this message and ultimately a letter was mailed to him.  Can you please have him make OV for use to discuss this and again rec referral to GI.  Thanks! Orpha BurKaty

## 2017-10-01 NOTE — Telephone Encounter (Signed)
Called patient and left message to call the office. MPulliam, CMA/RT(R)  

## 2017-10-03 NOTE — Telephone Encounter (Signed)
LVM for pt to call to discuss.  T. Kashvi Prevette, CMA  

## 2017-10-04 ENCOUNTER — Other Ambulatory Visit: Payer: Self-pay | Admitting: Cardiovascular Disease

## 2017-10-06 ENCOUNTER — Other Ambulatory Visit: Payer: Self-pay

## 2017-10-06 DIAGNOSIS — Z7901 Long term (current) use of anticoagulants: Secondary | ICD-10-CM

## 2017-10-06 NOTE — Telephone Encounter (Signed)
Please review for refill, Thanks !  

## 2017-10-06 NOTE — Telephone Encounter (Signed)
LVM for pt to call to discuss results.  T. Nelson, CMA  

## 2017-10-07 DIAGNOSIS — Z79899 Other long term (current) drug therapy: Secondary | ICD-10-CM | POA: Diagnosis not present

## 2017-10-07 DIAGNOSIS — M1009 Idiopathic gout, multiple sites: Secondary | ICD-10-CM | POA: Diagnosis not present

## 2017-10-07 DIAGNOSIS — M150 Primary generalized (osteo)arthritis: Secondary | ICD-10-CM | POA: Diagnosis not present

## 2017-10-08 NOTE — Telephone Encounter (Signed)
LVM for pt to call to discuss lab results.  Joel Ford. Nelson, CMA

## 2017-10-17 ENCOUNTER — Other Ambulatory Visit: Payer: Self-pay | Admitting: Adult Health

## 2017-10-17 NOTE — Telephone Encounter (Signed)
Do you wish for the pt's rheumatologist to RX this medication now?

## 2017-10-20 NOTE — Telephone Encounter (Signed)
LVM informing pt that rheumatology needs to RX his gout medications.  Joel Ford. Shine Scrogham, CMA

## 2017-10-20 NOTE — Telephone Encounter (Signed)
[name], Yes,his Rheumatologist should manage this rx now. Thanks! Orpha BurKaty

## 2017-10-27 ENCOUNTER — Other Ambulatory Visit: Payer: Self-pay | Admitting: Cardiovascular Disease

## 2017-11-25 ENCOUNTER — Ambulatory Visit: Payer: Medicare Other | Admitting: Cardiovascular Disease

## 2017-11-25 ENCOUNTER — Encounter: Payer: Self-pay | Admitting: Cardiovascular Disease

## 2017-11-25 VITALS — BP 150/78 | HR 65 | Ht 71.0 in | Wt 212.2 lb

## 2017-11-25 DIAGNOSIS — I480 Paroxysmal atrial fibrillation: Secondary | ICD-10-CM

## 2017-11-25 DIAGNOSIS — I359 Nonrheumatic aortic valve disorder, unspecified: Secondary | ICD-10-CM

## 2017-11-25 NOTE — Patient Instructions (Signed)
Medication Instructions: Continue same medications.   Labwork: None.   Procedures/Testing: None.   Follow-Up: 1 year with Dr. Kirke Corin.   Any Additional Special Instructions Will Be Listed Below (If Applicable).  Talk with your PCP regarding referral to GI and possibly hematology for evaluation of anemia and low platelet count.    If you need a refill on your cardiac medications before your next appointment, please call your pharmacy.  \

## 2017-11-25 NOTE — Progress Notes (Signed)
Cardiology Office Note   Date:  11/25/2017   ID:  Joel Ford, DOB [birth year], MRN [id number]  PCP:  Julaine Fusi, NP  Cardiologist:   Lorine Bears, MD   Chief Complaint  Patient presents with  . Other    Past due 6 month follow up. Patient denies chest pain and SOB. Patient states he is "doing good" Patient was last seen 09/24/2016. Meds reviewed verbally with patient.       History of Present Illness: Joel Ford is a 67 y.o. male who presents for a follow-up visit regarding paroxysmal atrial fibrillation. He was hospitalized in January 2018 due to A. fib with RVR. Echocardiogram showed normal LV systolic function, mild aortic stenosis, mild to moderate mitral regurgitation, mild to moderately dilated left atrium and mild pulmonary hypertension. He was treated with diltiazem and was started on anticoagulation with Xarelto.  He underwent a treadmill nuclear stress test in February 2018 which showed no evidence of ischemia with normal ejection fraction.  This was done due to abnormal EKG.  He has been doing well from a cardiac standpoint with no chest pain, shortness of breath or palpitations.  I reviewed his labs and he continues to have iron deficiency anemia with most recent hemoglobin of 10.4 and platelet count of 139.  He denies any bleeding.  Past Medical History:  Diagnosis Date  . ABNORMAL ELECTROCARDIOGRAM 07/04/2009   Qualifier: Diagnosis of  By: Jillyn Hidden FNP, Mcarthur Rossetti   . Atrial fibrillation (HCC)   . Gout   . HYPERGLYCEMIA 07/27/2009   Qualifier: Diagnosis of  By: Hetty Ely MD, Franne Grip   . HYPERLIPIDEMIA 07/04/2009   Qualifier: Diagnosis of  By: Jillyn Hidden FNP, Mcarthur Rossetti   . Left knee DJD    OA  . RENAL CALCULUS 02/15/2008   Qualifier: Diagnosis of  By: Jillyn Hidden FNP, Mcarthur Rossetti   . S/P total knee replacement 08/07/2009   right  . Thyroid disease     Past Surgical History:  Procedure Laterality Date  . APPENDECTOMY     age 78  .  CARDIAC CATHETERIZATION  01/23/2005   Normal  . FINGER SURGERY     RIGHT INDEX, REATTACHED  . orthoscopy     Right and left knee  . TOTAL KNEE ARTHROPLASTY  2011   Right  . TOTAL KNEE ARTHROPLASTY Left 09/21/2012   Dr Thurston Hole  . TOTAL KNEE ARTHROPLASTY Left 09/21/2012   Procedure: TOTAL KNEE ARTHROPLASTY;  Surgeon: Nilda Simmer, MD;  Location: [facility] OR;  Service: Orthopedics;  Laterality: Left;     Current Outpatient Medications  Medication Sig Dispense Refill  . allopurinol (ZYLOPRIM) 100 MG tablet 2 tabs by mouth once daily 180 tablet 0  . atorvastatin (LIPITOR) 20 MG tablet Take 1 tablet (20 mg total) by mouth daily. 90 tablet 1  . colchicine 0.6 MG tablet TAKE 2 TABLET AT THE ONSET THEN 1 TABLET 1 HOUR LATER, WAIT 12HOURS AND TAKE 1 TAB 2 TIMES AS NEEDED 6 tablet 0  . diltiazem (CARDIZEM CD) 240 MG 24 hr capsule Take 1 capsule (240 mg total) by mouth daily. 90 capsule 3  . ferrous sulfate 325 (65 FE) MG tablet Take 325 mg by mouth 2 (two) times daily with a meal.    . levothyroxine (SYNTHROID, LEVOTHROID) 50 MCG tablet Take 1 tablet (50 mcg total) by mouth daily before breakfast. 60 tablet 0  . metoprolol tartrate (LOPRESSOR) 25 MG tablet TAKE 0.5 TABLETS (12.5 MG TOTAL) BY MOUTH 2 (  TWO) TIMES DAILY. 30 tablet 0  . XARELTO 20 MG TABS tablet TAKE 1 TABLET (20 MG TOTAL) BY MOUTH DAILY WITH SUPPER. 90 tablet 3   No current facility-administered medications for this visit.     Allergies:   Patient has no known allergies.    Social History:  The patient  reports that he has never smoked. His smokeless tobacco use includes chew. He reports that he does not drink alcohol or use drugs.   Family History:  The patient's family history includes Healthy in his brother, mother, and son; Stroke in his father.    ROS:  Please see the history of present illness.   Otherwise, review of systems are positive for none.   All other systems are reviewed and negative.    PHYSICAL EXAM: VS:  BP (!)  150/78 (BP Location: Left Arm, Patient Position: Sitting, Cuff Size: Normal)   Pulse 65   Ht  (1.803 m)   Wt 212 lb 4 oz (96.3 kg)   BMI 29.60 kg/m  , BMI Body mass index is 29.6 kg/m. GEN: Well nourished, well developed, in no acute distress  HEENT: normal  Neck: no JVD, carotid bruits, or masses Cardiac: RRR; no  rubs, or gallops,no edema . 1/6 systolic ejection murmur in the aortic area and 2/6 holosystolic murmur at the apex Respiratory:  clear to auscultation bilaterally, normal work of breathing GI: soft, nontender, nondistended, + BS MS: no deformity or atrophy  Skin: warm and dry, no rash Neuro:  Strength and sensation are intact Psych: euthymic mood, full affect   EKG:  EKG is  ordered today. EKG showed normal sinus rhythm with possible left atrial enlargement.  Left ventricular hypertrophy with repolarization abnormalities.   Recent Labs: 03/03/2017: TSH 7.430 09/09/2017: ALT 9; BUN 12; Creatinine 1.0; Hemoglobin 10.4; Platelets 139; Potassium 4.3; Sodium 142    Lipid Panel    Component Value Date/Time   CHOL 204 (H) 03/03/2017 0921   TRIG 173 (H) 03/03/2017 0921   HDL 34 (L) 03/03/2017 0921   CHOLHDL 6.0 (H) 03/03/2017 0921   CHOLHDL 7 07/04/2009 1014   VLDL 32.2 07/04/2009 1014   LDLCALC 135 (H) 03/03/2017 0921   LDLDIRECT 179.9 07/04/2009 1014      Wt Readings from Last 3 Encounters:  11/25/17 212 lb 4 oz (96.3 kg)  06/13/17 217 lb (98.4 kg)  05/27/17 220 lb 11.2 oz (100.1 kg)       No flowsheet data found.    ASSESSMENT AND PLAN:  1.  Paroxysmal atrial fibrillation:  The patient continues to be in normal sinus rhythm. Continue treatment with diltiazem and small dose metoprolol. Continue long-term anticoagulation.   2. Iron deficiency anemia: Most recent hemoglobin was still low in the 10 range.  I asked him to discuss with his primary care physician referral to gastroenterology.  He might also need to see hematology given his low platelet  count.  3. Mild aortic stenosis and mild to moderate mitral regurgitation: Recommend repeat echocardiogram next year.   4. Abnormal EKG: Nuclear stress test showed no evidence of ischemia.  Disposition:   FU with me in 12 months  Signed,  Lorine Bears, MD  11/25/2017 2:52 PM    [facility]

## 2017-11-26 ENCOUNTER — Other Ambulatory Visit: Payer: Self-pay | Admitting: Cardiovascular Disease

## 2018-03-24 DIAGNOSIS — M150 Primary generalized (osteo)arthritis: Secondary | ICD-10-CM | POA: Diagnosis not present

## 2018-03-24 DIAGNOSIS — Z79899 Other long term (current) drug therapy: Secondary | ICD-10-CM | POA: Diagnosis not present

## 2018-03-24 DIAGNOSIS — M1009 Idiopathic gout, multiple sites: Secondary | ICD-10-CM | POA: Diagnosis not present

## 2018-03-27 ENCOUNTER — Other Ambulatory Visit: Payer: Self-pay | Admitting: Cardiovascular Disease

## 2018-04-21 DIAGNOSIS — D696 Thrombocytopenia, unspecified: Secondary | ICD-10-CM | POA: Diagnosis not present

## 2018-09-28 ENCOUNTER — Other Ambulatory Visit: Payer: Self-pay

## 2018-09-28 MED ORDER — METOPROLOL TARTRATE 25 MG PO TABS: 12.5000 mg | ORAL_TABLET | Freq: Two times a day (BID) | ORAL | 4 refills | Status: DC

## 2018-10-15 ENCOUNTER — Other Ambulatory Visit: Payer: Self-pay | Admitting: Cardiovascular Disease

## 2018-10-15 NOTE — Telephone Encounter (Signed)
Refill Request.  

## 2018-10-15 NOTE — Telephone Encounter (Signed)
67yo, 212 lbs, Scr 1.0 on 09/09/17, crcl 48ml/min Last OV 11/19/17 Indication: afib  Will refill for now given COVID. Note added to recall to obtain BMET and CBC when able.

## 2018-11-19 ENCOUNTER — Other Ambulatory Visit: Payer: Self-pay

## 2018-11-19 ENCOUNTER — Ambulatory Visit (INDEPENDENT_AMBULATORY_CARE_PROVIDER_SITE_OTHER): Payer: Self-pay | Admitting: Adult Health

## 2018-11-19 ENCOUNTER — Encounter: Payer: Self-pay | Admitting: Adult Health

## 2018-11-19 VITALS — BP 166/87 | HR 98 | Ht 71.0 in | Wt 209.0 lb

## 2018-11-19 DIAGNOSIS — Z0000 Encounter for general adult medical examination without abnormal findings: Secondary | ICD-10-CM

## 2018-11-19 DIAGNOSIS — R21 Rash and other nonspecific skin eruption: Secondary | ICD-10-CM

## 2018-11-19 DIAGNOSIS — W57XXXA Bitten or stung by nonvenomous insect and other nonvenomous arthropods, initial encounter: Secondary | ICD-10-CM | POA: Insufficient documentation

## 2018-11-19 HISTORY — DX: Bitten or stung by nonvenomous insect and other nonvenomous arthropods, initial encounter: W57.XXXA

## 2018-11-19 MED ORDER — MUPIROCIN 2 % EX OINT: 1.0000 "application " | TOPICAL_OINTMENT | Freq: Two times a day (BID) | CUTANEOUS | 0 refills | Status: DC

## 2018-11-19 NOTE — Assessment & Plan Note (Signed)
Assessment and Plan: Continue all medications as directed. Remain well hydrated, follow heart healthy diet Keep f/u with Cardiologist He is not experiencing systemic s/s or bulls eye rash- doxycycline not indicated Mupirocin Oint BID OTC Hydrocortisone cream as needed  COVID-19 Education: Signs and symptoms of COVID-19 infection were discussed with pt and how to seek care for testing.  The importance of following the Stay at Home order, and when out- Social Distancing and wearing a facial mask were discussed today.  Follow Up Instructions: 3 month f/u- CPE with fasting labs   I discussed the assessment and treatment plan with the patient. The patient was provided an opportunity to ask questions and all were answered. The patient agreed with the plan and demonstrated an understanding of the instructions.   The patient was advised to call back or seek an in-person evaluation if the symptoms worsen or if the condition fails to improve as anticipated.

## 2018-11-19 NOTE — Progress Notes (Signed)
Virtual Visit via Telephone Note  I connected with Joel Ford on 11/19/18 at  3:45 PM EDT by telephone and verified that I am speaking with the correct person using two identifiers.  Location: Patient: Home Provider: In Clinic   I discussed the limitations, risks, security and privacy concerns of performing an evaluation and management service by telephone and the availability of in person appointments. I also discussed with the patient that there may be a patient responsible charge related to this service. The patient expressed understanding and agreed to proceed.   History of Present Illness: [name] calls in today with multiple deer tick bites located on R thorax- lateral chest. He reports noticing bites about 3-4 days ago, unsure how long the ticks were attached. He reports itching at sites, denies pain or drainage. He denies fever/HA/N/V/D He denies change in appetite  He denies increase in myalgia He has not used any OTC medications He denies noticing "bulls eye rash"  Of Note- BP elevated today, he has upcoming TeleMedicine with his Cardiologist He has not followed up with this clinic since 05/2017- advised he needs CPE with fasting labs in 3 months  Patient Care Team    Relationship Specialty Notifications Start End  William Hamburgeranford, Katy D, NP PCP - General Family Medicine  08/27/16   Iran OuchArida, Muhammad A, MD Consulting Physician Cardiology  09/03/16     Patient Active Problem List   Diagnosis Date Noted  . Essential hypertension 03/03/2017  . Health care maintenance 09/03/2016  . New onset atrial fibrillation (HCC) 08/06/2016  . B12 deficiency anemia 08/06/2016  . Anemia, iron deficiency 08/06/2016  . Hypothyroidism 08/06/2016  . Paroxysmal atrial fibrillation (HCC) 08/05/2016  . Gout   . Left knee DJD   . Preop cardiovascular exam 09/06/2012  . Arthritis of knee, degenerative 08/30/2012  . EXTERNAL HEMORRHOIDS 12/26/2009  . S/P total knee replacement 08/07/2009  .  HYPERGLYCEMIA 07/27/2009  . Hyperlipidemia 07/04/2009  . ABNORMAL ELECTROCARDIOGRAM 07/04/2009  . CHEST WALL PAIN, ACUTE 08/30/2008  . RENAL CALCULUS 02/15/2008  . Thoracic or lumbosacral neuritis or radiculitis 02/15/2008  . GOUT 01/22/2008     Past Medical History:  Diagnosis Date  . ABNORMAL ELECTROCARDIOGRAM 07/04/2009   Qualifier: Diagnosis of  By: Jillyn HiddenBean FNP, Mcarthur RossettiBillie-Lynn Daniels   . Atrial fibrillation (HCC)   . Gout   . HYPERGLYCEMIA 07/27/2009   Qualifier: Diagnosis of  By: Hetty ElySchaller MD, Franne Gripobert Neal   . HYPERLIPIDEMIA 07/04/2009   Qualifier: Diagnosis of  By: Jillyn HiddenBean FNP, Mcarthur RossettiBillie-Lynn Daniels   . Left knee DJD    OA  . RENAL CALCULUS 02/15/2008   Qualifier: Diagnosis of  By: Jillyn HiddenBean FNP, Mcarthur RossettiBillie-Lynn Daniels   . S/P total knee replacement 08/07/2009   right  . Thyroid disease      Past Surgical History:  Procedure Laterality Date  . APPENDECTOMY     age 68  . CARDIAC CATHETERIZATION  01/23/2005   Normal  . FINGER SURGERY     RIGHT INDEX, REATTACHED  . orthoscopy     Right and left knee  . TOTAL KNEE ARTHROPLASTY  2011   Right  . TOTAL KNEE ARTHROPLASTY Left 09/21/2012   Dr Thurston HoleWainer  . TOTAL KNEE ARTHROPLASTY Left 09/21/2012   Procedure: TOTAL KNEE ARTHROPLASTY;  Surgeon: Nilda Simmerobert A Wainer, MD;  Location: [facility] OR;  Service: Orthopedics;  Laterality: Left;     Family History  Problem Relation Age of Onset  . Stroke Father   . Healthy Mother   . Healthy  Brother   . Healthy Son   . Colon cancer Neg Hx   . Prostate cancer Neg Hx      Social History   Substance and Sexual Activity  Drug Use No     Social History   Substance and Sexual Activity  Alcohol Use No     Social History   Tobacco Use  Smoking Status Never Smoker  Smokeless Tobacco Current User  . Types: Chew     Outpatient Encounter Medications as of 11/19/2018  Medication Sig  . allopurinol (ZYLOPRIM) 100 MG tablet 2 tabs by mouth once daily  . atorvastatin (LIPITOR) 20 MG tablet Take 1 tablet  (20 mg total) by mouth daily.  . colchicine 0.6 MG tablet TAKE 2 TABLET AT THE ONSET THEN 1 TABLET 1 HOUR LATER, WAIT 12HOURS AND TAKE 1 TAB 2 TIMES AS NEEDED  . diltiazem (CARDIZEM CD) 240 MG 24 hr capsule Take 1 capsule (240 mg total) by mouth daily.  . ferrous sulfate 325 (65 FE) MG tablet Take 325 mg by mouth 2 (two) times daily with a meal.  . levothyroxine (SYNTHROID, LEVOTHROID) 50 MCG tablet Take 1 tablet (50 mcg total) by mouth daily before breakfast.  . metoprolol tartrate (LOPRESSOR) 25 MG tablet Take 0.5 tablets (12.5 mg total) by mouth 2 (two) times daily.  Carlena Hurl 20 MG TABS tablet TAKE 1 TABLET BY MOUTH DAILY WITH SUPPER.  . mupirocin ointment (BACTROBAN) 2 % Apply 1 application topically 2 (two) times daily.   No facility-administered encounter medications on file as of 11/19/2018.     Allergies: Patient has no known allergies.  Body mass index is 29.15 kg/m.  Blood pressure (!) 166/87, pulse 98, height  (1.803 m), weight 209 lb (94.8 kg). .Review of Systems: Review of Systems: General:   Denies fever, chills, unexplained weight loss.  Optho/Auditory:   Denies visual changes, blurred vision/LOV Respiratory:   Denies SOB, DOE more than baseline levels.  Cardiovascular:   Denies chest pain, palpitations, new onset peripheral edema  Gastrointestinal:   Denies nausea, vomiting, diarrhea.  Genitourinary: Denies dysuria, freq/ urgency, flank pain or discharge from genitals.  Endocrine:     Denies hot or cold intolerance, polyuria, polydipsia. Musculoskeletal:   Denies unexplained myalgias, joint swelling, unexplained arthralgias, gait problems.  Skin:  Denies rash, suspicious lesions Neurological:     Denies dizziness, unexplained weakness, numbness  Psychiatric/Behavioral:   Denies mood changes, suicidal or homicidal ideations, hallucinations This patient does not have sx concerning for COVID-19 Infection (ie; fever, chills, cough, new or worsening shortness of  breath).  Observations/Objective: No acute distress noted during the telephone conversation  Assessment and Plan: Continue all medications as directed. Remain well hydrated, follow heart healthy diet Keep f/u with Cardiologist He is not experiencing systemic s/s or bulls eye rash- doxycycline not indicated Mupirocin Oint BID OTC Hydrocortisone cream as needed  COVID-19 Education: Signs and symptoms of COVID-19 infection were discussed with pt and how to seek care for testing.  The importance of following the Stay at Home order, and when out- Social Distancing and wearing a facial mask were discussed today.  Follow Up Instructions: 3 month f/u- CPE with fasting labs   I discussed the assessment and treatment plan with the patient. The patient was provided an opportunity to ask questions and all were answered. The patient agreed with the plan and demonstrated an understanding of the instructions.   The patient was advised to call back or seek an in-person evaluation  if the symptoms worsen or if the condition fails to improve as anticipated.  I provided 8 minutes of non-face-to-face time during this encounter.   Julaine Fusi, NP

## 2018-11-19 NOTE — Assessment & Plan Note (Signed)
3 month f/u- CPE with fasting labs No med refills until he follows-up

## 2018-11-24 DIAGNOSIS — Z79899 Other long term (current) drug therapy: Secondary | ICD-10-CM | POA: Diagnosis not present

## 2018-11-24 DIAGNOSIS — M150 Primary generalized (osteo)arthritis: Secondary | ICD-10-CM | POA: Diagnosis not present

## 2018-11-24 DIAGNOSIS — M1009 Idiopathic gout, multiple sites: Secondary | ICD-10-CM | POA: Diagnosis not present

## 2018-12-10 ENCOUNTER — Telehealth: Payer: Self-pay

## 2018-12-10 NOTE — Telephone Encounter (Signed)
Virtual Visit Pre-Appointment Phone Call  "Joel Ford, I am calling you today to discuss your upcoming appointment. We are currently trying to limit exposure to the virus that causes COVID-19 by seeing patients at home rather than in the office."  1. "What is the BEST phone number to call the day of the visit?" - include this in appointment notes  2. "Do you have or have access to (through a family member/friend) a smartphone with video capability that we can use for your visit?" a. If yes - list this number in appt notes as "cell" (if different from BEST phone #) and list the appointment type as a VIDEO visit in appointment notes b. If no - list the appointment type as a PHONE visit in appointment notes  3. Confirm consent - "In the setting of the current Covid19 crisis, you are scheduled for a video visit with your provider on 02/04/2019 at 4:30PM.  Just as we do with many in-office visits, in order for you to participate in this visit, we must obtain consent.  If you'd like, I can send this to your mychart (if signed up) or email for you to review.  Otherwise, I can obtain your verbal consent now.  All virtual visits are billed to your insurance company just like a normal visit would be.  By agreeing to a virtual visit, we'd like you to understand that the technology does not allow for your provider to perform an examination, and thus may limit your provider's ability to fully assess your condition. If your provider identifies any concerns that need to be evaluated in person, we will make arrangements to do so.  Finally, though the technology is pretty good, we cannot assure that it will always work on either your or our end, and in the setting of a video visit, we may have to convert it to a phone-only visit.  In either situation, we cannot ensure that we have a secure connection.  Are you willing to proceed?" STAFF: Did the patient verbally acknowledge consent to telehealth visit? Document YES/NO here:  YES  4. Advise patient to be prepared - "Two hours prior to your appointment, go ahead and check your blood pressure, pulse, oxygen saturation, and your weight (if you have the equipment to check those) and write them all down. When your visit starts, your provider will ask you for this information. If you have an Apple Watch or Kardia device, please plan to have heart rate information ready on the day of your appointment. Please have a pen and paper handy nearby the day of the visit as well."  5. Give patient instructions for MyChart download to smartphone OR Doximity/Doxy.me as below if video visit (depending on what platform provider is using)  6. Inform patient they will receive a phone call 15 minutes prior to their appointment time (may be from unknown caller ID) so they should be prepared to answer    TELEPHONE CALL NOTE  Joel Ford Cura has been deemed a candidate for a follow-up tele-health visit to limit community exposure during the Covid-19 pandemic. I spoke with the patient via phone to ensure availability of phone/video source, confirm preferred email & phone number, and discuss instructions and expectations.  I reminded Joel Ford Ford to be prepared with any vital sign and/or heart rhythm information that could potentially be obtained via home monitoring, at the time of his visit. I reminded Joel Ford Jutte to expect a phone call prior to his visit.  Tommie Sams McClain 12/10/2018 11:39 AM   INSTRUCTIONS FOR DOWNLOADING THE MYCHART APP TO SMARTPHONE  - The patient must first make sure to have activated MyChart and know their login information - If Apple, go to [employer] and type in MyChart in the search bar and download the app. If Android, ask patient to go to [employer] and type in [place] in the search bar and download the app. The app is free but as with any other app downloads, their phone may require them to verify saved payment information or Apple/Android password.   - The patient will need to then log into the app with their MyChart username and password, and select [facility] [place] as their healthcare provider to link the account. When it is time for your visit, go to the MyChart app, find appointments, and click Begin Video Visit. Be sure to Select Allow for your device to access the Microphone and Camera for your visit. You will then be connected, and your provider will be with you shortly.  **If they have any issues connecting, or need assistance please contact MyChart service desk [phone])**  **If using a computer, in order to ensure the best quality for their visit they will need to use either of the following Internet Browsers: [employer] or Google Chrome**  IF USING DOXIMITY or DOXY.ME - The patient will receive a link just prior to their visit by text.     FULL LENGTH CONSENT FOR TELE-HEALTH VISIT   I hereby voluntarily request, consent and authorize [facility] and its employed or contracted physicians, physician assistants, nurse practitioners or other licensed health care professionals (the Practitioner), to provide me with telemedicine health care services (the "Services") as deemed necessary by the treating Practitioner. I acknowledge and consent to receive the Services by the Practitioner via telemedicine. I understand that the telemedicine visit will involve communicating with the Practitioner through live audiovisual communication technology and the disclosure of certain medical information by electronic transmission. I acknowledge that I have been given the opportunity to request an in-person assessment or other available alternative prior to the telemedicine visit and am voluntarily participating in the telemedicine visit.  I understand that I have the right to withhold or withdraw my consent to the use of telemedicine in the course of my care at any time, without affecting my right to future care or treatment, and that  the Practitioner or I may terminate the telemedicine visit at any time. I understand that I have the right to inspect all information obtained and/or recorded in the course of the telemedicine visit and may receive copies of available information for a reasonable fee.  I understand that some of the potential risks of receiving the Services via telemedicine include:  Joel Ford Delay or interruption in medical evaluation due to technological equipment failure or disruption; . Information transmitted may not be sufficient (e.g. poor resolution of images) to allow for appropriate medical decision making by the Practitioner; and/or  . In rare instances, security protocols could fail, causing a breach of personal health information.  Furthermore, I acknowledge that it is my responsibility to provide information about my medical history, conditions and care that is complete and accurate to the best of my ability. I acknowledge that Practitioner's advice, recommendations, and/or decision may be based on factors not within their control, such as incomplete or inaccurate data provided by me or distortions of diagnostic images or specimens that may result from electronic transmissions. I understand that  the practice of medicine is not an exact science and that Practitioner makes no warranties or guarantees regarding treatment outcomes. I acknowledge that I will receive a copy of this consent concurrently upon execution via email to the email address I last provided but may also request a printed copy by calling the office of [place].    I understand that my insurance will be billed for this visit.   I have read or had this consent read to me. . I understand the contents of this consent, which adequately explains the benefits and risks of the Services being provided via telemedicine.  . I have been provided ample opportunity to ask questions regarding this consent and the Services and have had my questions answered to  my satisfaction. . I give my informed consent for the services to be provided through the use of telemedicine in my medical care  By participating in this telemedicine visit I agree to the above.

## 2018-12-23 ENCOUNTER — Other Ambulatory Visit: Payer: Self-pay | Admitting: Cardiovascular Disease

## 2019-01-22 DIAGNOSIS — M1009 Idiopathic gout, multiple sites: Secondary | ICD-10-CM | POA: Diagnosis not present

## 2019-02-04 ENCOUNTER — Telehealth (INDEPENDENT_AMBULATORY_CARE_PROVIDER_SITE_OTHER): Payer: Self-pay | Admitting: Cardiovascular Disease

## 2019-02-04 ENCOUNTER — Other Ambulatory Visit: Payer: Self-pay

## 2019-02-04 VITALS — BP 145/83 | HR 71 | Ht 71.0 in | Wt 210.0 lb

## 2019-02-04 DIAGNOSIS — I10 Essential (primary) hypertension: Secondary | ICD-10-CM

## 2019-02-04 DIAGNOSIS — I480 Paroxysmal atrial fibrillation: Secondary | ICD-10-CM

## 2019-02-04 MED ORDER — METOPROLOL TARTRATE 25 MG PO TABS: 25.0000 mg | ORAL_TABLET | Freq: Two times a day (BID) | ORAL | 2 refills | Status: DC

## 2019-02-04 NOTE — Patient Instructions (Signed)
Medication Instructions:  Increase metoprolol to 25 mg twice daily If you need a refill on your cardiac medications before your next appointment, please call your pharmacy.   Lab work: None If you have labs (blood work) drawn today and your tests are completely normal, you will receive your results only by: [name] MyChart Message (if you have MyChart) OR . A paper copy in the mail If you have any lab test that is abnormal or we need to change your treatment, we will call you to review the results.  Testing/Procedures: None  Follow-Up: At [facility], you and your health needs are our priority.  As part of our continuing mission to provide you with exceptional heart care, we have created designated Provider Care Teams.  These Care Teams include your primary Cardiologist (physician) and Advanced Practice Providers (APPs -  Physician Assistants and Nurse Practitioners) who all work together to provide you with the care you need, when you need it. You will need a follow up appointment in 6 months.  Please call our office 2 months in advance to schedule this appointment.  You may see Kathlyn Sacramento, MD or one of the following Advanced Practice Providers on your designated Care Team:   Murray Hodgkins, NP Christell Faith, PA-C . Marrianne Mood, PA-C

## 2019-02-04 NOTE — Progress Notes (Signed)
Virtual Visit via Telephone Note   This visit type was conducted due to national recommendations for restrictions regarding the COVID-19 Pandemic (e.g. social distancing) in an effort to limit this patient's exposure and mitigate transmission in our community.  Due to his co-morbid illnesses, this patient is at least at moderate risk for complications without adequate follow up.  This format is felt to be most appropriate for this patient at this time.  The patient did not have access to video technology/had technical difficulties with video requiring transitioning to audio format only (telephone).  All issues noted in this document were discussed and addressed.  No physical exam could be performed with this format.  Please refer to the patient's chart for his  consent to telehealth for [facility].   Date:  02/04/2019   ID:  Joel Ford, DOB 11/12/1950, MRN [id number]  Patient Location: Home Provider Location: Office  PCP:  Julaine Fusianford, Katy D, NP  Cardiologist:  Lorine BearsMuhammad Andrika Peraza, MD  Electrophysiologist:  None   Evaluation Performed:  Follow-Up Visit  Chief Complaint: "Xarelto is expensive"  History of Present Illness:    Joel PanningLee Roy Ford is a 68 y.o. male who was reached via phone for follow-up visit regarding paroxysmal atrial fibrillation. He was hospitalized in January 2018 due to A. fib with RVR. Echocardiogram showed normal LV systolic function, mild aortic stenosis, mild to moderate mitral regurgitation, mild to moderately dilated left atrium and mild pulmonary hypertension. He was treated with diltiazem and was started on anticoagulation with Xarelto.  He underwent a treadmill nuclear stress test in February 2018 which showed no evidence of ischemia with normal ejection fraction.  This was done due to abnormal EKG. He has known history of iron deficiency anemia with no recent labs.  He has been doing reasonably well with no recent chest pain or shortness of breath.  For unclear  reasons, he is no longer on diltiazem but continues to be on metoprolol.  He complains of high cost of Xarelto which makes him go into the donut hole for about 3 months.  He is asking for alternatives.   The patient does not have symptoms concerning for COVID-19 infection (fever, chills, cough, or new shortness of breath).    Past Medical History:  Diagnosis Date  . ABNORMAL ELECTROCARDIOGRAM 07/04/2009   Qualifier: Diagnosis of  By: Jillyn HiddenBean FNP, Mcarthur RossettiBillie-Lynn Daniels   . Atrial fibrillation (HCC)   . Gout   . HYPERGLYCEMIA 07/27/2009   Qualifier: Diagnosis of  By: Hetty ElySchaller MD, Franne Gripobert Neal   . HYPERLIPIDEMIA 07/04/2009   Qualifier: Diagnosis of  By: Jillyn HiddenBean FNP, Mcarthur RossettiBillie-Lynn Daniels   . Left knee DJD    OA  . RENAL CALCULUS 02/15/2008   Qualifier: Diagnosis of  By: Jillyn HiddenBean FNP, Mcarthur RossettiBillie-Lynn Daniels   . S/P total knee replacement 08/07/2009   right  . Thyroid disease    Past Surgical History:  Procedure Laterality Date  . APPENDECTOMY     age 68  . CARDIAC CATHETERIZATION  01/23/2005   Normal  . FINGER SURGERY     RIGHT INDEX, REATTACHED  . orthoscopy     Right and left knee  . TOTAL KNEE ARTHROPLASTY  2011   Right  . TOTAL KNEE ARTHROPLASTY Left 09/21/2012   Dr Thurston HoleWainer  . TOTAL KNEE ARTHROPLASTY Left 09/21/2012   Procedure: TOTAL KNEE ARTHROPLASTY;  Surgeon: Nilda Simmerobert A Wainer, MD;  Location: [facility] OR;  Service: Orthopedics;  Laterality: Left;     Current Meds  Medication Sig  .  allopurinol (ZYLOPRIM) 300 MG tablet Take 300 mg by mouth daily.  . colchicine 0.6 MG tablet Take 0.6 mg by mouth daily.  . ferrous sulfate 325 (65 FE) MG tablet Take 325 mg by mouth 2 (two) times daily with a meal.  . metoprolol tartrate (LOPRESSOR) 25 MG tablet TAKE 0.5 TABLETS (12.5 MG TOTAL) BY MOUTH 2 (TWO) TIMES DAILY.  [name] XARELTO 20 MG TABS tablet TAKE 1 TABLET BY MOUTH DAILY WITH SUPPER.     Allergies:   Patient has no known allergies.   Social History   Tobacco Use  . Smoking status: Never Smoker  .  Smokeless tobacco: Current User    Types: Chew  Substance Use Topics  . Alcohol use: No  . Drug use: No     Family Hx: The patient's family history includes Healthy in his brother, mother, and son; Stroke in his father. There is no history of Colon cancer or Prostate cancer.  ROS:   Please see the history of present illness.     All other systems reviewed and are negative.   Prior CV studies:   The following studies were reviewed today:    Labs/Other Tests and Data Reviewed:    EKG:  No ECG reviewed.  Recent Labs: No results found for requested labs within last 8760 hours.   Recent Lipid Panel Lab Results  Component Value Date/Time   CHOL 204 (H) 03/03/2017 09:21 AM   TRIG 173 (H) 03/03/2017 09:21 AM   HDL 34 (L) 03/03/2017 09:21 AM   CHOLHDL 6.0 (H) 03/03/2017 09:21 AM   CHOLHDL 7 07/04/2009 10:14 AM   LDLCALC 135 (H) 03/03/2017 09:21 AM   LDLDIRECT 179.9 07/04/2009 10:14 AM    Wt Readings from Last 3 Encounters:  02/04/19 210 lb (95.3 kg)  11/19/18 209 lb (94.8 kg)  11/25/17 212 lb 4 oz (96.3 kg)     Objective:    Vital Signs:  BP (!) 145/83 (BP Location: Left Arm, Patient Position: Sitting, Cuff Size: Normal)   Pulse 71   Ht 5\' 11"  (1.803 m)   Wt 210 lb (95.3 kg)   BMI 29.29 kg/m    VITAL SIGNS:  reviewed  ASSESSMENT & PLAN:    1.  Paroxysmal atrial fibrillation: He has no symptoms of palpitations and seem to be in sinus rhythm.  He is no longer on diltiazem for unclear reasons.  I elected to increase metoprolol to 25 mg twice daily.  Chads vas score is 2.  He is currently on Xarelto but reports increased cost.  I asked him to check with his insurance about possibly switching him to Eliquis if the cost is less.  Otherwise the only generic alternative is warfarin and I explained the process with warfarin anticoagulation.  He prefers not to go that route.  The patient also needs follow-up labs but he prefers to wait until next year.  2. Iron deficiency  anemia: Repeat labs in 6 months  3. Mild aortic stenosis and mild to moderate mitral regurgitation: Recommend repeat echocardiogram in 6 months.   COVID-19 Education: The signs and symptoms of COVID-19 were discussed with the patient and how to seek care for testing (follow up with PCP or arrange E-visit).  The importance of social distancing was discussed today.  Time:   Today, I have spent 14 minutes with the patient with telehealth technology discussing the above problems.     Medication Adjustments/Labs and Tests Ordered: Current medicines are reviewed at length with the patient  today.  Concerns regarding medicines are outlined above.   Tests Ordered: No orders of the defined types were placed in this encounter.   Medication Changes: No orders of the defined types were placed in this encounter.   Follow Up:  In Person in 6 month(s)  Signed, Lorine BearsMuhammad Joceline Hinchcliff, MD  02/04/2019 4:58 PM    [facility]

## 2019-03-30 DIAGNOSIS — R69 Illness, unspecified: Secondary | ICD-10-CM | POA: Diagnosis not present

## 2019-05-03 ENCOUNTER — Other Ambulatory Visit: Payer: Self-pay | Admitting: Cardiovascular Disease

## 2019-05-03 ENCOUNTER — Other Ambulatory Visit: Payer: Self-pay

## 2019-05-03 DIAGNOSIS — Z7901 Long term (current) use of anticoagulants: Secondary | ICD-10-CM

## 2019-05-03 NOTE — Telephone Encounter (Signed)
Refill Request.  

## 2019-05-03 NOTE — Telephone Encounter (Signed)
Pt's age 68, wt 95.3 kg, last ov w/ Dr. Fletcher Anon 02/04/19. Pt overdue for labs - will need BMET at next ov.  Order placed.

## 2019-07-28 ENCOUNTER — Ambulatory Visit: Payer: Medicare Other | Attending: Internal Medicine

## 2019-07-28 DIAGNOSIS — Z23 Encounter for immunization: Secondary | ICD-10-CM | POA: Insufficient documentation

## 2019-07-28 NOTE — Progress Notes (Signed)
   Covid-19 Vaccination Clinic  Name:  Estus Krakowski    MRN: [id number] DOB: 02-26-1951  07/28/2019  Mr. Ku was observed post Covid-19 immunization for 15 minutes without incidence. He was provided with Vaccine Information Sheet and instruction to access the V-Safe system.   Mr. Chiang was instructed to call 911 with any severe reactions post vaccine: [name] Difficulty breathing  . Swelling of your face and throat  . A fast heartbeat  . A bad rash all over your body  . Dizziness and weakness    Immunizations Administered    Name Date Dose VIS Date Route   Pfizer COVID-19 Vaccine 07/28/2019  6:05 PM 0.3 mL 06/18/2019 Intramuscular   Manufacturer: [employer], [employer]   Lot: [id number]   NDC: [address]-1

## 2019-07-29 ENCOUNTER — Other Ambulatory Visit: Payer: Self-pay | Admitting: Cardiovascular Disease

## 2019-07-29 NOTE — Telephone Encounter (Signed)
Refill request for Xarelto 

## 2019-08-18 ENCOUNTER — Ambulatory Visit: Payer: Medicare HMO | Attending: Internal Medicine

## 2019-08-18 DIAGNOSIS — Z23 Encounter for immunization: Secondary | ICD-10-CM | POA: Insufficient documentation

## 2019-08-18 NOTE — Progress Notes (Signed)
   Covid-19 Vaccination Clinic  Name:  Joel Ford    MRN: [id number] DOB: August 21, 1950  08/18/2019  Joel Ford was observed post Covid-19 immunization for 15 minutes without incidence. He was provided with Vaccine Information Sheet and instruction to access the V-Safe system.   Joel Ford was instructed to call 911 with any severe reactions post vaccine: [name] Difficulty breathing  . Swelling of your face and throat  . A fast heartbeat  . A bad rash all over your body  . Dizziness and weakness    Immunizations Administered    Name Date Dose VIS Date Route   Pfizer COVID-19 Vaccine 08/18/2019  4:58 PM 0.3 mL 06/18/2019 Intramuscular   Manufacturer: [employer], [employer]   Lot: [id number]   NDC: [address]-6

## 2019-09-10 DIAGNOSIS — M1009 Idiopathic gout, multiple sites: Secondary | ICD-10-CM | POA: Diagnosis not present

## 2019-09-10 DIAGNOSIS — Z79899 Other long term (current) drug therapy: Secondary | ICD-10-CM | POA: Diagnosis not present

## 2019-10-25 ENCOUNTER — Other Ambulatory Visit: Payer: Self-pay | Admitting: Cardiovascular Disease

## 2019-10-25 NOTE — Telephone Encounter (Signed)
Refill Request.  

## 2019-10-26 ENCOUNTER — Other Ambulatory Visit: Payer: Self-pay | Admitting: Cardiovascular Disease

## 2019-10-26 NOTE — Telephone Encounter (Signed)
Left message requesting to have patient call back to schedule F/U appointment.

## 2019-10-26 NOTE — Telephone Encounter (Signed)
Please schedule overdue office visit with Dr. Arida. Thanky ou! 

## 2019-11-03 ENCOUNTER — Telehealth: Payer: Self-pay | Admitting: Cardiovascular Disease

## 2019-11-03 NOTE — Telephone Encounter (Signed)
3 attempts to schedule fu appt from recall list.   Deleting recall.   

## 2019-11-03 NOTE — Telephone Encounter (Signed)
Attempted to schedule.  LMOV to call office.  ° °

## 2019-11-08 NOTE — Telephone Encounter (Signed)
3 attempts to schedule fu appt from recall list.   Deleting recall.      Attempted to schedule.  LMOV to call office.   

## 2019-11-24 DIAGNOSIS — E663 Overweight: Secondary | ICD-10-CM | POA: Diagnosis not present

## 2019-11-24 DIAGNOSIS — Z6829 Body mass index (BMI) 29.0-29.9, adult: Secondary | ICD-10-CM | POA: Diagnosis not present

## 2019-11-24 DIAGNOSIS — M150 Primary generalized (osteo)arthritis: Secondary | ICD-10-CM | POA: Diagnosis not present

## 2019-11-24 DIAGNOSIS — M1009 Idiopathic gout, multiple sites: Secondary | ICD-10-CM | POA: Diagnosis not present

## 2019-11-24 DIAGNOSIS — Z79899 Other long term (current) drug therapy: Secondary | ICD-10-CM | POA: Diagnosis not present

## 2019-12-05 ENCOUNTER — Other Ambulatory Visit: Payer: Self-pay | Admitting: Cardiovascular Disease

## 2019-12-17 ENCOUNTER — Other Ambulatory Visit: Payer: Self-pay | Admitting: Cardiovascular Disease

## 2019-12-31 ENCOUNTER — Other Ambulatory Visit: Payer: Self-pay | Admitting: Cardiovascular Disease

## 2020-01-04 ENCOUNTER — Telehealth: Payer: Self-pay | Admitting: Cardiovascular Disease

## 2020-01-04 NOTE — Telephone Encounter (Signed)
-----   Message from Loman Chroman sent at 12/27/2019  4:05 PM EDT ----- Regarding: FW: fu appt LVM for patient to schedule fu ----- Message ----- From: Loman Chroman Sent: 12/20/2019  10:29 AM EDT To: [facility] Scheduling Subject: fu appt                                        LVM on both numbers for patient to schedule overdue 6 month fu ----- Message ----- From: Festus Aloe, CMA Sent: 12/17/2019   5:12 PM EDT To: Loni Muse [facility] Scheduling  Please contact patient for a follow up with Dr. Jarold Song.  Thanks, SHaron

## 2020-01-04 NOTE — Telephone Encounter (Signed)
3 attempts to schedule fu appt from recall list.   Deleting recall.   

## 2020-01-19 ENCOUNTER — Other Ambulatory Visit: Payer: Self-pay | Admitting: Cardiovascular Disease

## 2020-01-23 ENCOUNTER — Other Ambulatory Visit: Payer: Self-pay | Admitting: Cardiovascular Disease

## 2020-01-24 NOTE — Telephone Encounter (Signed)
Refill Request.  

## 2020-02-26 ENCOUNTER — Other Ambulatory Visit: Payer: Self-pay | Admitting: Cardiovascular Disease

## 2020-02-28 NOTE — Telephone Encounter (Signed)
Refill request

## 2020-04-12 ENCOUNTER — Encounter: Payer: Self-pay | Admitting: Family

## 2020-04-12 ENCOUNTER — Ambulatory Visit (INDEPENDENT_AMBULATORY_CARE_PROVIDER_SITE_OTHER): Payer: Medicare HMO | Admitting: Family

## 2020-04-12 ENCOUNTER — Other Ambulatory Visit: Payer: Self-pay

## 2020-04-12 VITALS — BP 130/64 | HR 63 | Ht 71.0 in | Wt 201.0 lb

## 2020-04-12 DIAGNOSIS — I340 Nonrheumatic mitral (valve) insufficiency: Secondary | ICD-10-CM | POA: Diagnosis not present

## 2020-04-12 DIAGNOSIS — I480 Paroxysmal atrial fibrillation: Secondary | ICD-10-CM | POA: Diagnosis not present

## 2020-04-12 DIAGNOSIS — Z7901 Long term (current) use of anticoagulants: Secondary | ICD-10-CM

## 2020-04-12 DIAGNOSIS — I350 Nonrheumatic aortic (valve) stenosis: Secondary | ICD-10-CM

## 2020-04-12 MED ORDER — METOPROLOL TARTRATE 25 MG PO TABS: 25.0000 mg | ORAL_TABLET | Freq: Two times a day (BID) | ORAL | 3 refills | Status: DC

## 2020-04-12 MED ORDER — RIVAROXABAN 20 MG PO TABS: 20.0000 mg | ORAL_TABLET | Freq: Every day | ORAL | 11 refills | Status: DC

## 2020-04-12 NOTE — Progress Notes (Signed)
Office Visit    Patient Name: Joel Ford Date of Encounter: 04/12/2020  Primary Care Provider:  No primary care provider on file. Primary Cardiologist:  Lorine Bears, MD Electrophysiologist:  None   Chief Complaint    Joel Ford is a 69 y.o. male with a hx of paroxysmal atrial fibrillation, mild aortic stenosis/mild to moderate MR/mild pulmonary hypertension by echo 2018, iron deficiency anemia presents today for follow-up of PAF  Past Medical History    Past Medical History:  Diagnosis Date  . ABNORMAL ELECTROCARDIOGRAM 07/04/2009   Qualifier: Diagnosis of  By: Jillyn Hidden FNP, Mcarthur Rossetti   . Atrial fibrillation (HCC)   . Gout   . HYPERGLYCEMIA 07/27/2009   Qualifier: Diagnosis of  By: Hetty Ely MD, Franne Grip   . HYPERLIPIDEMIA 07/04/2009   Qualifier: Diagnosis of  By: Jillyn Hidden FNP, Mcarthur Rossetti   . Left knee DJD    OA  . RENAL CALCULUS 02/15/2008   Qualifier: Diagnosis of  By: Jillyn Hidden FNP, Mcarthur Rossetti   . S/P total knee replacement 08/07/2009   right  . Thyroid disease    Past Surgical History:  Procedure Laterality Date  . APPENDECTOMY     age 89  . CARDIAC CATHETERIZATION  01/23/2005   Normal  . FINGER SURGERY     RIGHT INDEX, REATTACHED  . orthoscopy     Right and left knee  . TOTAL KNEE ARTHROPLASTY  2011   Right  . TOTAL KNEE ARTHROPLASTY Left 09/21/2012   Dr Thurston Hole  . TOTAL KNEE ARTHROPLASTY Left 09/21/2012   Procedure: TOTAL KNEE ARTHROPLASTY;  Surgeon: Nilda Simmer, MD;  Location: [facility] OR;  Service: Orthopedics;  Laterality: Left;    Allergies  No Known Allergies  History of Present Illness    Joel Ford is a 69 y.o. male with a hx of paroxysmal atrial fibrillation, mild aortic stenosis/mild to moderate MR/mild pulmonary hypertension by echo 2018, iron deficiency anemia last seen 01/28/2019 by Dr. Kirke Corin via telemedicine.  Hospitalized January 2018 due to atrial fibrillation with RVR.  Echo at that time with normal LV systolic  function, mild aortic stenosis, mild to moderate MR, mild to moderately dilated LA and mild pulmonary hypertension.  He was treated with diltiazem and started on anticoagulation with Xarelto.  Underwent treadmill nuclear stress test 08/2016 with no evidence of ischemia with normal EF.  It was performed due to abnormal EKG.  When last seen 01/08/2019 by telemedicine he was noted to be off diltiazem and on metoprolol for unclear reason. His Metoprolol was increased to Lopressor 25mg  BID. He was recommended for repeat echo in 6 months, but this was not completed.  Presents today for follow-up.  Reports feeling overall well.  Enjoyed staying busy volunteering at the [employer].  He also does his own yard work. Reports no shortness of breath nor dyspnea on exertion. Reports no chest pain, pressure, or tightness. No edema, orthopnea, PND. Reports no palpitations.  EKGs/Labs/Other Studies Reviewed:   The following studies were reviewed today: Echo 07/2016 Left ventricle: The cavity size was normal. There was mild    concentric hypertrophy. Systolic function was normal. The    estimated ejection fraction was in the range of 60% to 65%. Wall    motion was normal; there were no regional wall motion    abnormalities.  - Aortic valve: There was very mild stenosis. There was trivial    regurgitation. Valve area (VTI): 2.13 cm^2. Valve area (Vmax):    1.81 cm^2.  Valve area (Vmean): 1.94 cm^2.  - Mitral valve: There was mild to moderate regurgitation directed    centrally.  - Left atrium: The atrium was mildly to moderately dilated.  - Pulmonary arteries: Systolic pressure was mildly increased. PA    peak pressure: 37 mm Hg (S).   EKG:  EKG is  ordered today.  The ekg ordered today demonstrates NSR 63 bpm with LVH and repolarization abnormality. Stable TWI in anterolateral leads. No acute ST/T wave changes.   Recent Labs: No results found for requested labs within last 8760 hours.  Recent Lipid Panel      Component Value Date/Time   CHOL 204 (H) 03/03/2017 0921   TRIG 173 (H) 03/03/2017 0921   HDL 34 (L) 03/03/2017 0921   CHOLHDL 6.0 (H) 03/03/2017 0921   CHOLHDL 7 07/04/2009 1014   VLDL 32.2 07/04/2009 1014   LDLCALC 135 (H) 03/03/2017 0921   LDLDIRECT 179.9 07/04/2009 1014    Home Medications   Current Meds  Medication Sig  . allopurinol (ZYLOPRIM) 300 MG tablet Take 300 mg by mouth daily.  . colchicine 0.6 MG tablet Take 0.6 mg by mouth daily.  . Cyanocobalamin (VITAMIN B 12) 100 MCG LOZG Take by mouth daily.  . ferrous sulfate 325 (65 FE) MG tablet Take 325 mg by mouth 2 (two) times daily with a meal.  . metoprolol tartrate (LOPRESSOR) 25 MG tablet Take 1 tablet (25 mg total) by mouth 2 (two) times daily. Please schedule office visit for further refills. Thank you!  . rivaroxaban (XARELTO) 20 MG TABS tablet Take 1 tablet (20 mg total) by mouth daily with supper. NEED APPT w/ DR. Kirke Corin FOR REFILLS      Review of Systems    Review of Systems  Constitutional: Negative for chills, fever and malaise/fatigue.  Cardiovascular: Negative for chest pain, dyspnea on exertion, irregular heartbeat, leg swelling, near-syncope, orthopnea, palpitations and syncope.  Respiratory: Negative for cough, shortness of breath and wheezing.   Gastrointestinal: Negative for melena, nausea and vomiting.  Genitourinary: Negative for hematuria.  Neurological: Negative for dizziness, light-headedness and weakness.   All other systems reviewed and are otherwise negative except as noted above.  Physical Exam    VS:  Ht 5\' 11"  (1.803 m)   Wt 201 lb (91.2 kg)   BMI 28.03 kg/m  , BMI Body mass index is 28.03 kg/m. GEN: Well nourished, well developed, in no acute distress. HEENT: normal. Neck: Supple, no JVD, carotid bruits, or masses. Cardiac: RRR, no  rubs, or gallops. Gr 2/6 systolic murmur. No clubbing, cyanosis, edema.  Radials/DP/PT 2+ and equal bilaterally.  Respiratory:  Respirations regular  and unlabored, clear to auscultation bilaterally. GI: Soft, nontender, nondistended, BS + x 4. MS: No deformity or atrophy. Skin: Warm and dry, no rash. Neuro:  Strength and sensation are intact. Psych: Normal affect.  Assessment & Plan    1. Paroxysmal atrial fibrillation/chronic anticoagulation - maintaining NSR by EKG today.  Denies palpitations. Denies bleeding complications on anticoagulation.  Continue Xarelto 20 mg daily.  CBC, BMP today for monitoring.  Does report that he is in the donut hole for his Xarelto and pain greater than $85 per prescription.  As such while he is in the office today we have applied for [place] select program  2. Iron deficiency anemia- CBC today for monitoring. Continue iron supplement. Denies constipation.   3. Mild aortic stenosis and mild to moderate MR - By echo 2018. No near syncope, dyspnea, chest pain. Gr  2/6 murmur on exam. Echo ordered for monitoring.   Disposition: BMP, CBC today. Echocardiogram for monitoring of aortic stenosis.  Follow up in 6 month(s) with Dr. Kirke Corin or APP   Alver Sorrow, NP 04/12/2020, 1:17 PM

## 2020-04-12 NOTE — Patient Instructions (Signed)
Medication Instructions:  No medication changes today. We have sent a refill of your Xarelto and Metoprolol to the pharmacy.   *If you need a refill on your cardiac medications before your next appointment, please call your pharmacy*  Lab Work: Your provider recommends that you return for lab work today: BMP, CBC  If you have labs (blood work) drawn today and your tests are completely normal, you will receive your results only by:  MyChart Message (if you have MyChart) OR  A paper copy in the mail If you have any lab test that is abnormal or we need to change your treatment, we will call you to review the results.   Testing/Procedures: Your EKG today was stable compared to previous.   Your physician has requested that you have an echocardiogram. Echocardiography is a painless test that uses sound waves to create images of your heart. It provides your doctor with information about the size and shape of your heart and how well your hearts chambers and valves are working. This procedure takes approximately one hour. There are no restrictions for this procedure.  Follow-Up: At [facility], you and your health needs are our priority.  As part of our continuing mission to provide you with exceptional heart care, we have created designated Provider Care Teams.  These Care Teams include your primary Cardiologist (physician) and Advanced Practice Providers (APPs -  Physician Assistants and Nurse Practitioners) who all work together to provide you with the care you need, when you need it.  We recommend signing up for the patient portal called "MyChart".  Sign up information is provided on this After Visit Summary.  MyChart is used to connect with patients for Virtual Visits (Telemedicine).  Patients are able to view lab/test results, encounter notes, upcoming appointments, etc.  Non-urgent messages can be sent to your provider as well.   To learn more about what you can do with MyChart, go to  [email or web address].    Your next appointment:   6 month(s)  The format for your next appointment:   In Person  Provider:   You may see Lorine Bears, MD or one of the following Advanced Practice Providers on your designated Care Team:    Nicolasa Ducking, NP  Eula Listen, PA-C  Marisue Ivan, PA-C  Gillian Shields, NP  Cadence Fransico Michael, [place]    Other Instructions  We applied for [employer] to help with cost of [place]. Anticipate a call from [facility] pharmacy [phone]  Recommend establishing with a primary care provider.  o You may call [facility] @ [phone] for a list of primary care providers in your area or visit their website [email or web address] o Please have any insurance card available before calling or going online.

## 2020-04-13 LAB — CBC WITH DIFFERENTIAL/PLATELET
Basophils Absolute: 0 10*3/uL (ref 0.0–0.2)
Basos: 1 %
EOS (ABSOLUTE): 0.2 10*3/uL (ref 0.0–0.4)
Eos: 4 %
Hematocrit: 40.3 % (ref 37.5–51.0)
Hemoglobin: 12.8 g/dL — ABNORMAL LOW (ref 13.0–17.7)
Immature Grans (Abs): 0.2 10*3/uL — ABNORMAL HIGH (ref 0.0–0.1)
Immature Granulocytes: 5 %
Lymphocytes Absolute: 0.8 10*3/uL (ref 0.7–3.1)
Lymphs: 20 %
MCH: 26.7 pg (ref 26.6–33.0)
MCHC: 31.8 g/dL (ref 31.5–35.7)
MCV: 84 fL (ref 79–97)
Monocytes Absolute: 0.5 10*3/uL (ref 0.1–0.9)
Monocytes: 11 %
Neutrophils Absolute: 2.5 10*3/uL (ref 1.4–7.0)
Neutrophils: 59 %
Platelets: 142 10*3/uL — ABNORMAL LOW (ref 150–450)
RBC: 4.79 x10E6/uL (ref 4.14–5.80)
RDW: 13 % (ref 11.6–15.4)
WBC: 4.2 10*3/uL (ref 3.4–10.8)

## 2020-04-13 LAB — BASIC METABOLIC PANEL
BUN/Creatinine Ratio: 14 (ref 10–24)
BUN: 15 mg/dL (ref 8–27)
CO2: 26 mmol/L (ref 20–29)
Calcium: 9 mg/dL (ref 8.6–10.2)
Chloride: 103 mmol/L (ref 96–106)
Creatinine, Ser: 1.07 mg/dL (ref 0.76–1.27)
GFR calc Af Amer: 81 mL/min/{1.73_m2} (ref >59)
GFR calc non Af Amer: 70 mL/min/{1.73_m2} (ref >59)
Glucose: 78 mg/dL (ref 65–99)
Potassium: 4.7 mmol/L (ref 3.5–5.2)
Sodium: 141 mmol/L (ref 134–144)

## 2020-05-03 ENCOUNTER — Other Ambulatory Visit: Payer: Self-pay

## 2020-05-03 ENCOUNTER — Ambulatory Visit (INDEPENDENT_AMBULATORY_CARE_PROVIDER_SITE_OTHER): Payer: Medicare HMO

## 2020-05-03 DIAGNOSIS — I350 Nonrheumatic aortic (valve) stenosis: Secondary | ICD-10-CM

## 2020-05-03 LAB — ECHOCARDIOGRAM COMPLETE
AR max vel: 2.24 cm2
AV Area VTI: 2.35 cm2
AV Area mean vel: 2.35 cm2
AV Mean grad: 12.7 mmHg
AV Peak grad: 24.4 mmHg
Ao pk vel: 2.47 m/s
Area-P 1/2: 2.49 cm2
S' Lateral: 2.5 cm

## 2020-05-03 MED ORDER — PERFLUTREN LIPID MICROSPHERE
1.0000 mL | INTRAVENOUS | Status: AC | PRN
  Administered 2020-05-03: 2 mL via INTRAVENOUS

## 2020-05-22 DIAGNOSIS — R69 Illness, unspecified: Secondary | ICD-10-CM | POA: Diagnosis not present

## 2020-11-14 DIAGNOSIS — M150 Primary generalized (osteo)arthritis: Secondary | ICD-10-CM | POA: Diagnosis not present

## 2020-11-14 DIAGNOSIS — M1009 Idiopathic gout, multiple sites: Secondary | ICD-10-CM | POA: Diagnosis not present

## 2020-11-14 DIAGNOSIS — E663 Overweight: Secondary | ICD-10-CM | POA: Diagnosis not present

## 2020-11-14 DIAGNOSIS — Z6828 Body mass index (BMI) 28.0-28.9, adult: Secondary | ICD-10-CM | POA: Diagnosis not present

## 2020-11-14 DIAGNOSIS — Z79899 Other long term (current) drug therapy: Secondary | ICD-10-CM | POA: Diagnosis not present

## 2020-12-26 ENCOUNTER — Other Ambulatory Visit: Payer: Self-pay

## 2020-12-26 ENCOUNTER — Emergency Department (HOSPITAL_BASED_OUTPATIENT_CLINIC_OR_DEPARTMENT_OTHER)
Admission: EM | Admit: 2020-12-26 | Discharge: 2020-12-27 | Disposition: A | Discharge status: Home / Self Care | Payer: Medicare HMO | Attending: Emergency Medicine | Admitting: Emergency Medicine

## 2020-12-26 ENCOUNTER — Emergency Department (HOSPITAL_BASED_OUTPATIENT_CLINIC_OR_DEPARTMENT_OTHER): Payer: Medicare HMO

## 2020-12-26 ENCOUNTER — Encounter (HOSPITAL_BASED_OUTPATIENT_CLINIC_OR_DEPARTMENT_OTHER): Payer: Self-pay | Admitting: *Deleted

## 2020-12-26 DIAGNOSIS — I4891 Unspecified atrial fibrillation: Secondary | ICD-10-CM | POA: Diagnosis not present

## 2020-12-26 DIAGNOSIS — Z96653 Presence of artificial knee joint, bilateral: Secondary | ICD-10-CM | POA: Insufficient documentation

## 2020-12-26 DIAGNOSIS — X501XXA Overexertion from prolonged static or awkward postures, initial encounter: Secondary | ICD-10-CM | POA: Insufficient documentation

## 2020-12-26 DIAGNOSIS — I10 Essential (primary) hypertension: Secondary | ICD-10-CM | POA: Diagnosis not present

## 2020-12-26 DIAGNOSIS — N200 Calculus of kidney: Secondary | ICD-10-CM | POA: Diagnosis not present

## 2020-12-26 DIAGNOSIS — E039 Hypothyroidism, unspecified: Secondary | ICD-10-CM | POA: Diagnosis not present

## 2020-12-26 DIAGNOSIS — N400 Enlarged prostate without lower urinary tract symptoms: Secondary | ICD-10-CM | POA: Diagnosis not present

## 2020-12-26 DIAGNOSIS — R109 Unspecified abdominal pain: Secondary | ICD-10-CM | POA: Diagnosis not present

## 2020-12-26 DIAGNOSIS — Z9049 Acquired absence of other specified parts of digestive tract: Secondary | ICD-10-CM | POA: Diagnosis not present

## 2020-12-26 DIAGNOSIS — Z7901 Long term (current) use of anticoagulants: Secondary | ICD-10-CM | POA: Insufficient documentation

## 2020-12-26 DIAGNOSIS — M62830 Muscle spasm of back: Secondary | ICD-10-CM | POA: Diagnosis not present

## 2020-12-26 DIAGNOSIS — S3992XA Unspecified injury of lower back, initial encounter: Secondary | ICD-10-CM | POA: Diagnosis not present

## 2020-12-26 DIAGNOSIS — S39012A Strain of muscle, fascia and tendon of lower back, initial encounter: Secondary | ICD-10-CM

## 2020-12-26 DIAGNOSIS — Z79899 Other long term (current) drug therapy: Secondary | ICD-10-CM | POA: Insufficient documentation

## 2020-12-26 DIAGNOSIS — M62838 Other muscle spasm: Secondary | ICD-10-CM | POA: Diagnosis not present

## 2020-12-26 DIAGNOSIS — I700 Atherosclerosis of aorta: Secondary | ICD-10-CM | POA: Diagnosis not present

## 2020-12-26 LAB — URINALYSIS, ROUTINE W REFLEX MICROSCOPIC
Bilirubin Urine: NEGATIVE
Glucose, UA: NEGATIVE mg/dL
Hgb urine dipstick: NEGATIVE
Ketones, ur: NEGATIVE mg/dL
Leukocytes,Ua: NEGATIVE
Nitrite: NEGATIVE
Protein, ur: 30 mg/dL — AB
Specific Gravity, Urine: >1.03 — ABNORMAL HIGH (ref 1.005–1.030)
pH: 5.5 (ref 5.0–8.0)

## 2020-12-26 LAB — URINALYSIS, MICROSCOPIC (REFLEX)

## 2020-12-26 NOTE — ED Triage Notes (Addendum)
C/o right flank pain x 3 days, hx kidney stones, wife to assist pt to bathroom for u/a

## 2020-12-27 DIAGNOSIS — Z9049 Acquired absence of other specified parts of digestive tract: Secondary | ICD-10-CM | POA: Diagnosis not present

## 2020-12-27 DIAGNOSIS — N400 Enlarged prostate without lower urinary tract symptoms: Secondary | ICD-10-CM | POA: Diagnosis not present

## 2020-12-27 DIAGNOSIS — I700 Atherosclerosis of aorta: Secondary | ICD-10-CM | POA: Diagnosis not present

## 2020-12-27 DIAGNOSIS — N200 Calculus of kidney: Secondary | ICD-10-CM | POA: Diagnosis not present

## 2020-12-27 MED ORDER — METHOCARBAMOL 500 MG PO TABS: 500.0000 mg | ORAL_TABLET | Freq: Two times a day (BID) | ORAL | 0 refills | Status: AC

## 2020-12-27 MED ORDER — KETOROLAC TROMETHAMINE 60 MG/2ML IM SOLN
30.0000 mg | Freq: Once | INTRAMUSCULAR | Status: AC
  Administered 2020-12-27: 30 mg via INTRAMUSCULAR
  Filled 2020-12-27: qty 2

## 2020-12-27 NOTE — ED Notes (Signed)
Pt ambulatory and reports relief after Toradol injection.

## 2020-12-27 NOTE — ED Provider Notes (Signed)
[facility] EMERGENCY DEPARTMENT Provider Note  CSN: [id number] Arrival date & time: 12/26/20 1955  Chief Complaint(s) Flank Pain  HPI Joel Ford is a 70 y.o. male   The history is provided by the patient.  Flank Pain This is a new problem. Episode onset: 2 weeks intermittent, but constant for 2 days. The problem occurs constantly. The problem has been gradually worsening. Pertinent negatives include no chest pain, no abdominal pain, no headaches and no shortness of breath. The symptoms are aggravated by twisting and bending. The symptoms are relieved by position. Treatments tried: Aleve. The treatment provided no relief.   Has been cleaning the yard of tree logs and tossing them.  Past Medical History Past Medical History:  Diagnosis Date   ABNORMAL ELECTROCARDIOGRAM 07/04/2009   Qualifier: Diagnosis of  By: Jillyn HiddenBean FNP, Mcarthur RossettiBillie-Lynn Daniels    Atrial fibrillation (HCC)    Gout    HYPERGLYCEMIA 07/27/2009   Qualifier: Diagnosis of  By: Hetty ElySchaller MD, Franne Gripobert Neal    HYPERLIPIDEMIA 07/04/2009   Qualifier: Diagnosis of  By: Jillyn HiddenBean FNP, [name]    Left knee DJD    OA   RENAL CALCULUS 02/15/2008   Qualifier: Diagnosis of  By: Jillyn HiddenBean FNP, Mcarthur RossettiBillie-Lynn Daniels    S/P total knee replacement 08/07/2009   right   Thyroid disease    Patient Active Problem List   Diagnosis Date Noted   Tick bite 11/19/2018   Essential hypertension 03/03/2017   Health care maintenance 09/03/2016   New onset atrial fibrillation (HCC) 08/06/2016   B12 deficiency anemia 08/06/2016   Anemia, iron deficiency 08/06/2016   Hypothyroidism 08/06/2016   Paroxysmal atrial fibrillation (HCC) 08/05/2016   Gout    Left knee DJD    Preop cardiovascular exam 09/06/2012   Arthritis of knee, degenerative 08/30/2012   EXTERNAL HEMORRHOIDS 12/26/2009   S/P total knee replacement 08/07/2009   HYPERGLYCEMIA 07/27/2009   Hyperlipidemia 07/04/2009   ABNORMAL ELECTROCARDIOGRAM 07/04/2009   CHEST WALL  PAIN, ACUTE 08/30/2008   RENAL CALCULUS 02/15/2008   Thoracic or lumbosacral neuritis or radiculitis 02/15/2008   GOUT 01/22/2008   Home Medication(s) Prior to Admission medications   Medication Sig Start Date End Date Taking? Authorizing Provider  methocarbamol (ROBAXIN) 500 MG tablet Take 1 tablet (500 mg total) by mouth 2 (two) times daily for 10 days. 12/27/20 01/06/21 Yes Lekesha Claw, Amadeo GarnetPedro Eduardo, MD  allopurinol (ZYLOPRIM) 300 MG tablet Take 300 mg by mouth daily. 01/27/19   [provider]  colchicine 0.6 MG tablet Take 0.6 mg by mouth daily.    [provider]  Cyanocobalamin (VITAMIN B 12) 100 MCG LOZG Take by mouth daily.    [provider]  ferrous sulfate 325 (65 FE) MG tablet Take 325 mg by mouth 2 (two) times daily with a meal.    [provider]  metoprolol tartrate (LOPRESSOR) 25 MG tablet Take 1 tablet (25 mg total) by mouth 2 (two) times daily. 04/12/20   Alver SorrowWalker, Caitlin S, NP  rivaroxaban (XARELTO) 20 MG TABS tablet Take 1 tablet (20 mg total) by mouth daily with supper. 04/12/20   Alver SorrowWalker, Caitlin S, NP  Past Surgical History Past Surgical History:  Procedure Laterality Date   APPENDECTOMY     age 75   CARDIAC CATHETERIZATION  01/23/2005   Normal   FINGER SURGERY     RIGHT INDEX, REATTACHED   orthoscopy     Right and left knee   TOTAL KNEE ARTHROPLASTY  2011   Right   TOTAL KNEE ARTHROPLASTY Left 09/21/2012   Dr Thurston Hole   TOTAL KNEE ARTHROPLASTY Left 09/21/2012   Procedure: TOTAL KNEE ARTHROPLASTY;  Surgeon: Nilda Simmer, MD;  Location: [facility];  Service: Orthopedics;  Laterality: Left;   Family History Family History  Problem Relation Age of Onset   Stroke Father    Healthy Mother    Healthy Brother    Healthy Son    Colon cancer Neg Hx    Prostate cancer Neg Hx     Social History Social History    Tobacco Use   Smoking status: Never   Smokeless tobacco: Current    Types: Chew  Substance Use Topics   Alcohol use: No   Drug use: No   Allergies Patient has no known allergies.  Review of Systems Review of Systems  Respiratory:  Negative for shortness of breath.   Cardiovascular:  Negative for chest pain.  Gastrointestinal:  Negative for abdominal pain.  Genitourinary:  Positive for flank pain.  Neurological:  Negative for headaches.  All other systems are reviewed and are negative for acute change except as noted in the HPI  Physical Exam Vital Signs  I have reviewed the triage vital signs BP (!) 144/85 (BP Location: Right Arm)   Pulse 61   Temp (!) 97.5 F (36.4 C) (Oral)   Resp 18   Ht 5\' 11"  (1.803 m)   Wt 108.9 kg   SpO2 100%   BMI 33.47 kg/m   Physical Exam Vitals reviewed.  Constitutional:      General: He is not in acute distress.    Appearance: He is well-developed. He is not diaphoretic.  HENT:     Head: Normocephalic and atraumatic.     Right Ear: External ear normal.     Left Ear: External ear normal.     Nose: Nose normal.     Mouth/Throat:     Mouth: Mucous membranes are moist.  Eyes:     General: No scleral icterus.    Conjunctiva/sclera: Conjunctivae normal.  Neck:     Trachea: Phonation normal.  Cardiovascular:     Rate and Rhythm: Normal rate and regular rhythm.  Pulmonary:     Effort: Pulmonary effort is normal. No respiratory distress.     Breath sounds: No stridor.  Abdominal:     General: There is no distension.  Musculoskeletal:        General: Normal range of motion.     Cervical back: Normal range of motion.     Lumbar back: Spasms and tenderness present. No bony tenderness.       Back:  Neurological:     Mental Status: He is alert and oriented to person, place, and time.     Comments: Spine Exam: Strength: 5/5 throughout LE bilaterally  Sensation: Intact to light touch in proximal and distal LE bilaterally Reflexes:  1+ quadriceps and achilles reflexes   Psychiatric:        Behavior: Behavior normal.    ED Results and Treatments Labs (all labs ordered are listed, but only abnormal results are displayed) Labs Reviewed  URINALYSIS, ROUTINE W REFLEX MICROSCOPIC -  Abnormal; Notable for the following components:      Result Value   Specific Gravity, Urine >1.030 (*)    Protein, ur 30 (*)    All other components within normal limits  URINALYSIS, MICROSCOPIC (REFLEX) - Abnormal; Notable for the following components:   Bacteria, UA RARE (*)    All other components within normal limits                                                                                                                         EKG  EKG Interpretation  Date/Time:    Ventricular Rate:    PR Interval:    QRS Duration:   QT Interval:    QTC Calculation:   R Axis:     Text Interpretation:          Radiology CT Renal Stone Study  Result Date: 12/27/2020 CLINICAL DATA:  Flank pain.  Evaluate for kidney stone EXAM: CT ABDOMEN AND PELVIS WITHOUT CONTRAST TECHNIQUE: Multidetector CT imaging of the abdomen and pelvis was performed following the standard protocol without IV contrast. COMPARISON:  12/06/2013 FINDINGS: Lower chest: No acute abnormality. Hepatobiliary: No focal liver abnormality is seen. No gallstones, gallbladder wall thickening, or biliary dilatation. Pancreas: Unremarkable. No pancreatic ductal dilatation or surrounding inflammatory changes. Spleen: Normal in size without focal abnormality. Adrenals/Urinary Tract: Normal adrenal glands. Bilateral kidney stones are identified. The largest right kidney stone is in the upper pole measuring 3 mm. The largest left kidney stone is in the inferior pole measuring 3 mm. No hydronephrosis identified bilaterally. No hydroureter or ureteral lithiasis. Urinary bladder is unremarkable. Stomach/Bowel: Stomach appears within normal limits. Status post appendectomy. Scattered colonic  diverticula identified no signs of acute diverticulitis. No bowel wall thickening, inflammation, or distension. Vascular/Lymphatic: Aortic atherosclerosis. No aneurysm. No abdominopelvic adenopathy. Reproductive: Prostate gland enlargement. Other: No free fluid or fluid collections. Musculoskeletal: No acute osseous findings. Mild degenerative disc disease with ventral endplate spurring noted in the thoracolumbar spine. IMPRESSION: 1. No acute findings within the abdomen or pelvis. 2. Bilateral nonobstructing renal calculi. 3. Aortic atherosclerosis. Aortic Atherosclerosis (ICD10-I70.0). Electronically Signed   By: Signa Kell M.D.   On: 12/27/2020 00:20    Pertinent labs & imaging results that were available during my care of the patient were reviewed by me and considered in my medical decision making (see chart for details).  Medications Ordered in ED Medications  ketorolac (TORADOL) injection 30 mg (30 mg Intramuscular Given 12/27/20 0037)  Procedures Procedures  (including critical care time)  Medical Decision Making / ED Course I have reviewed the nursing notes for this encounter and the patient's prior records (if available in EHR or on provided paperwork).   Joel Ford was evaluated in Emergency Department on 12/27/2020 for the symptoms described in the history of present illness. He was evaluated in the context of the global COVID-19 pandemic, which necessitated consideration that the patient might be at risk for infection with the SARS-CoV-2 virus that causes COVID-19. Institutional protocols and algorithms that pertain to the evaluation of patients at risk for COVID-19 are in a state of rapid change based on information released by regulatory bodies including the CDC and federal and state organizations. These policies and algorithms were followed during the  patient's care in the ED.  70 y.o. male presents with back pain in lumbar area for 2 weeks without signs of radicular pain. No acute traumatic onset. No red flag symptoms of fever, weight loss, saddle anesthesia, weakness, fecal/urinary incontinence or urinary retention.   CT negative. UA without evidence of infection or hematuria concerning for stones.  Suspect MSK etiology. No indication for imaging emergently. Patient was recommended to take short course of scheduled NSAIDs and engage in early mobility as definitive treatment. Return precautions discussed for worsening or new concerning symptoms.        Final Clinical Impression(s) / ED Diagnoses Final diagnoses:  Strain of lumbar region, initial encounter  Muscle spasm   The patient appears reasonably screened and/or stabilized for discharge and I doubt any other medical condition or other [facility] requiring further screening, evaluation, or treatment in the ED at this time prior to discharge. Safe for discharge with strict return precautions.  Disposition: Discharge  Condition: Good  I have discussed the results, Dx and Tx plan with the patient/family who expressed understanding and agree(s) with the plan. Discharge instructions discussed at length. The patient/family was given strict return precautions who verbalized understanding of the instructions. No further questions at time of discharge.    ED Discharge Orders          Ordered    methocarbamol (ROBAXIN) 500 MG tablet  2 times daily        12/27/20 0123             Follow Up: Primary care provider  Call  to schedule an appointment for close follow up, if symptoms do not improve or  worsen in 2-3 weeks     This chart was dictated using voice recognition software.  Despite best efforts to proofread,  errors can occur which can change the documentation meaning.    Nira Conn, MD 12/27/20 [phone]

## 2020-12-27 NOTE — Discharge Instructions (Addendum)
You may use over-the-counter Motrin (Ibuprofen), Acetaminophen (Tylenol), topical muscle creams such as SalonPas, Icy Hot, Bengay, etc. Please stretch, apply ice or heat (whichever helps), and have massage therapy for additional assistance.  

## 2021-03-22 ENCOUNTER — Other Ambulatory Visit: Payer: Self-pay | Admitting: Family

## 2021-03-22 DIAGNOSIS — Z7901 Long term (current) use of anticoagulants: Secondary | ICD-10-CM

## 2021-03-23 NOTE — Telephone Encounter (Signed)
Prescription refill request for Xarelto received.  Indication:atrial fib Last office visit:10/21 Weight:108.9 kg Age:70 Scr:1.0 CrCl:105.88 ml/min  Prescription refilled

## 2021-04-03 ENCOUNTER — Ambulatory Visit
Admission: EM | Admit: 2021-04-03 | Discharge: 2021-04-03 | Disposition: A | Discharge status: Home / Self Care | Payer: Medicare HMO | Attending: Urgent Care | Admitting: Urgent Care

## 2021-04-03 ENCOUNTER — Other Ambulatory Visit: Payer: Self-pay

## 2021-04-03 DIAGNOSIS — M545 Low back pain, unspecified: Secondary | ICD-10-CM | POA: Diagnosis not present

## 2021-04-03 DIAGNOSIS — S39012A Strain of muscle, fascia and tendon of lower back, initial encounter: Secondary | ICD-10-CM | POA: Diagnosis not present

## 2021-04-03 MED ORDER — TIZANIDINE HCL 4 MG PO TABS: 2.0000 mg | ORAL_TABLET | Freq: Every evening | ORAL | 0 refills | Status: DC | PRN

## 2021-04-03 MED ORDER — KETOROLAC TROMETHAMINE 30 MG/ML IJ SOLN
30.0000 mg | Freq: Once | INTRAMUSCULAR | Status: AC
  Administered 2021-04-03: 30 mg via INTRAMUSCULAR

## 2021-04-03 NOTE — ED Triage Notes (Signed)
Pt reports that over the weekend while he was doing yard work, he aggravated his lower back. Transitioning from sitting to standing aggravate sxs. Has been taking previously prescribed pain meds without relief. No BLE pain. No falls or injuries noted.

## 2021-04-03 NOTE — Discharge Instructions (Addendum)
Do not use any nonsteroidal anti-inflammatories (NSAIDs) like ibuprofen, Motrin, naproxen, Aleve, etc. which are all available over-the-counter.  Please just use Tylenol at a dose of 500mg-650mg once every 6 hours as needed for your aches, pains, fevers. 

## 2021-04-03 NOTE — ED Provider Notes (Signed)
[facility]   MRN: [id number] DOB: 02-Mar-1951  Subjective:   Joel Ford is a 70 y.o. male presenting for 2-day history of persistent lower back pain on either side.  Initially symptoms started over the right side.  This was after he did extensive yard work at his home since he could not find a Surveyor, minerals to help him with it.  Has been using methocarbamol without any relief.  Previously injured his back 3 months ago, had complete work-up through the emergency room.  States that it helped him tremendously to get the Toradol injection and would like to have this again.  Denies fever, dysuria, hematuria, changes to bowel or urinary habits, weakness, numbness or tingling.  No history of heart disease or MI.  Patient is on Xarelto chronically for atrial fibrillation.  No history of kidney disease.  No current facility-administered medications for this encounter.  Current Outpatient Medications:    allopurinol (ZYLOPRIM) 300 MG tablet, Take 300 mg by mouth daily., Disp: , Rfl:    colchicine 0.6 MG tablet, Take 0.6 mg by mouth daily., Disp: , Rfl:    Cyanocobalamin (VITAMIN B 12) 100 MCG LOZG, Take by mouth daily., Disp: , Rfl:    ferrous sulfate 325 (65 FE) MG tablet, Take 325 mg by mouth 2 (two) times daily with a meal., Disp: , Rfl:    metoprolol tartrate (LOPRESSOR) 25 MG tablet, Take 1 tablet (25 mg total) by mouth 2 (two) times daily., Disp: 180 tablet, Rfl: 3   rivaroxaban (XARELTO) 20 MG TABS tablet, TAKE 1 TABLET BY MOUTH DAILY WITH SUPPER., Disp: 90 tablet, Rfl: 1   No Known Allergies  Past Medical History:  Diagnosis Date   ABNORMAL ELECTROCARDIOGRAM 07/04/2009   Qualifier: Diagnosis of  By: Jillyn Hidden FNP, Mcarthur Rossetti    Atrial fibrillation (HCC)    Gout    HYPERGLYCEMIA 07/27/2009   Qualifier: Diagnosis of  By: Hetty Ely MD, Franne Grip    HYPERLIPIDEMIA 07/04/2009   Qualifier: Diagnosis of  By: Jillyn Hidden FNP, [name]    Left knee DJD    OA   RENAL  CALCULUS 02/15/2008   Qualifier: Diagnosis of  By: Jillyn Hidden FNP, Mcarthur Rossetti    S/P total knee replacement 08/07/2009   right   Thyroid disease      Past Surgical History:  Procedure Laterality Date   APPENDECTOMY     age 85   CARDIAC CATHETERIZATION  01/23/2005   Normal   FINGER SURGERY     RIGHT INDEX, REATTACHED   orthoscopy     Right and left knee   TOTAL KNEE ARTHROPLASTY  2011   Right   TOTAL KNEE ARTHROPLASTY Left 09/21/2012   Dr Thurston Hole   TOTAL KNEE ARTHROPLASTY Left 09/21/2012   Procedure: TOTAL KNEE ARTHROPLASTY;  Surgeon: Nilda Simmer, MD;  Location: [facility] OR;  Service: Orthopedics;  Laterality: Left;    Family History  Problem Relation Age of Onset   Stroke Father    Healthy Mother    Healthy Brother    Healthy Son    Colon cancer Neg Hx    Prostate cancer Neg Hx     Social History   Tobacco Use   Smoking status: Never   Smokeless tobacco: Current    Types: Chew  Substance Use Topics   Alcohol use: No   Drug use: No    ROS   Objective:   Vitals: BP 131/75 (BP Location: Left Arm)   Pulse 75   Temp 97.7  F (36.5 C) (Oral)   Resp 18   SpO2 96%   Physical Exam Constitutional:      General: He is not in acute distress.    Appearance: Normal appearance. He is well-developed and normal weight. He is not ill-appearing, toxic-appearing or diaphoretic.  HENT:     Head: Normocephalic and atraumatic.     Right Ear: External ear normal.     Left Ear: External ear normal.     Nose: Nose normal.     Mouth/Throat:     Pharynx: Oropharynx is clear.  Eyes:     General: No scleral icterus.       Right eye: No discharge.        Left eye: No discharge.     Extraocular Movements: Extraocular movements intact.     Pupils: Pupils are equal, round, and reactive to light.  Cardiovascular:     Rate and Rhythm: Normal rate.  Pulmonary:     Effort: Pulmonary effort is normal.  Musculoskeletal:     Cervical back: Normal range of motion.     Lumbar back:  Spasms and tenderness (on either side of the lumbar region) present. No swelling, edema, deformity, signs of trauma, lacerations or bony tenderness. Normal range of motion. Negative right straight leg raise test and negative left straight leg raise test. No scoliosis.     Comments: No mid line tenderness.  No rashes.  Skin:    General: Skin is warm and dry.  Neurological:     Mental Status: He is alert and oriented to person, place, and time.     Motor: No weakness.     Coordination: Coordination normal.     Gait: Gait normal.     Deep Tendon Reflexes: Reflexes normal.  Psychiatric:        Mood and Affect: Mood normal.        Behavior: Behavior normal.        Thought Content: Thought content normal.        Judgment: Judgment normal.    Assessment and Plan :   PDMP not reviewed this encounter.  1. Acute bilateral low back pain without sciatica   2. Lumbar strain, initial encounter     Will manage conservatively for back strain with IM Toradol in clinic, Tylenol as an outpatient and muscle relaxant, rest and modification of physical activity.  Anticipatory guidance provided.  Counseled patient on potential for adverse effects with medications prescribed/recommended today, ER and return-to-clinic precautions discussed, patient verbalized understanding.    Wallis Bamberg, PA-C 04/03/21 1331

## 2021-04-04 ENCOUNTER — Ambulatory Visit: Payer: Medicare HMO | Admitting: Physician Assistant

## 2021-04-28 ENCOUNTER — Other Ambulatory Visit: Payer: Self-pay | Admitting: Family

## 2021-04-28 DIAGNOSIS — I480 Paroxysmal atrial fibrillation: Secondary | ICD-10-CM

## 2021-05-14 ENCOUNTER — Other Ambulatory Visit: Payer: Self-pay | Admitting: Family

## 2021-05-14 DIAGNOSIS — I480 Paroxysmal atrial fibrillation: Secondary | ICD-10-CM

## 2021-05-14 NOTE — Telephone Encounter (Signed)
Rx request sent to pharmacy.  

## 2021-05-15 DIAGNOSIS — M1009 Idiopathic gout, multiple sites: Secondary | ICD-10-CM | POA: Diagnosis not present

## 2021-05-15 DIAGNOSIS — Z6829 Body mass index (BMI) 29.0-29.9, adult: Secondary | ICD-10-CM | POA: Diagnosis not present

## 2021-05-15 DIAGNOSIS — E663 Overweight: Secondary | ICD-10-CM | POA: Diagnosis not present

## 2021-05-15 DIAGNOSIS — Z79899 Other long term (current) drug therapy: Secondary | ICD-10-CM | POA: Diagnosis not present

## 2021-05-15 DIAGNOSIS — M150 Primary generalized (osteo)arthritis: Secondary | ICD-10-CM | POA: Diagnosis not present

## 2021-05-25 ENCOUNTER — Other Ambulatory Visit: Payer: Self-pay

## 2021-05-25 ENCOUNTER — Ambulatory Visit
Admission: EM | Admit: 2021-05-25 | Discharge: 2021-05-25 | Disposition: A | Discharge status: Home / Self Care | Payer: Medicare HMO | Attending: Physician Assistant | Admitting: Physician Assistant

## 2021-05-25 ENCOUNTER — Encounter: Payer: Self-pay | Admitting: Emergency Medicine

## 2021-05-25 DIAGNOSIS — R319 Hematuria, unspecified: Secondary | ICD-10-CM | POA: Diagnosis not present

## 2021-05-25 DIAGNOSIS — M549 Dorsalgia, unspecified: Secondary | ICD-10-CM

## 2021-05-25 LAB — POCT URINALYSIS DIP (MANUAL ENTRY)
Bilirubin, UA: NEGATIVE
Glucose, UA: NEGATIVE mg/dL
Ketones, POC UA: NEGATIVE mg/dL
Nitrite, UA: NEGATIVE
Protein Ur, POC: 30 mg/dL — AB
Spec Grav, UA: 1.025 (ref 1.010–1.025)
Urobilinogen, UA: 1 E.U./dL
pH, UA: 5.5 (ref 5.0–8.0)

## 2021-05-25 MED ORDER — HYDROCODONE-ACETAMINOPHEN 5-325 MG PO TABS: 1.0000 | ORAL_TABLET | ORAL | 0 refills | Status: DC | PRN

## 2021-05-25 NOTE — Discharge Instructions (Addendum)
Return if any problems.

## 2021-05-25 NOTE — ED Provider Notes (Signed)
[facility]    CSN: [id number] Arrival date & time: 05/25/21  0836      History   Chief Complaint Chief Complaint  Patient presents with   Back Pain    HPI Joel Ford is a 70 y.o. male.   Pt complains of pain in his left low back.  Pt reports pain radiates around to left abdomen.  Pt denies fever or chills.  Pt reports he noticed pain after using a backpack blower in the yard.    The history is provided by the patient. No language interpreter was used.  Back Pain Location:  Lumbar spine Quality:  Aching Radiates to:  Does not radiate Pain severity:  Moderate Pain is:  Worse during the day Duration:  2 days Timing:  Intermittent Progression:  Worsening Chronicity:  New Relieved by:  Nothing Worsened by:  Nothing Ineffective treatments:  None tried Associated symptoms: no bladder incontinence, no dysuria and no fever    Past Medical History:  Diagnosis Date   ABNORMAL ELECTROCARDIOGRAM 07/04/2009   Qualifier: Diagnosis of  By: Maxie Better FNP, Rosalita Levan    Atrial fibrillation ([place])    Gout    HYPERGLYCEMIA 07/27/2009   Qualifier: Diagnosis of  By: Council Mechanic MD, Hilaria Ota    HYPERLIPIDEMIA 07/04/2009   Qualifier: Diagnosis of  By: Maxie Better FNP, [name]    Left knee DJD    OA   RENAL CALCULUS 02/15/2008   Qualifier: Diagnosis of  By: Maxie Better FNP, Rosalita Levan    S/P total knee replacement 08/07/2009   right   Thyroid disease     Patient Active Problem List   Diagnosis Date Noted   Tick bite 11/19/2018   Essential hypertension 03/03/2017   Health care maintenance 09/03/2016   New onset atrial fibrillation ([place]) 08/06/2016   B12 deficiency anemia 08/06/2016   Anemia, iron deficiency 08/06/2016   Hypothyroidism 08/06/2016   Paroxysmal atrial fibrillation ([place]) 08/05/2016   Gout    Left knee DJD    Preop cardiovascular exam 09/06/2012   Arthritis of knee, degenerative 08/30/2012   EXTERNAL HEMORRHOIDS 12/26/2009   S/P total  knee replacement 08/07/2009   HYPERGLYCEMIA 07/27/2009   Hyperlipidemia 07/04/2009   ABNORMAL ELECTROCARDIOGRAM 07/04/2009   CHEST WALL PAIN, ACUTE 08/30/2008   RENAL CALCULUS 02/15/2008   Thoracic or lumbosacral neuritis or radiculitis 02/15/2008   GOUT 01/22/2008    Past Surgical History:  Procedure Laterality Date   APPENDECTOMY     age 41   CARDIAC CATHETERIZATION  01/23/2005   Normal   FINGER SURGERY     RIGHT INDEX, REATTACHED   orthoscopy     Right and left knee   TOTAL KNEE ARTHROPLASTY  2011   Right   TOTAL KNEE ARTHROPLASTY Left 09/21/2012   Dr Noemi Chapel   TOTAL KNEE ARTHROPLASTY Left 09/21/2012   Procedure: TOTAL KNEE ARTHROPLASTY;  Surgeon: Lorn Junes, MD;  Location: [place];  Service: Orthopedics;  Laterality: Left;       Home Medications    Prior to Admission medications   Medication Sig Start Date End Date Taking? Authorizing Provider  HYDROcodone-acetaminophen (NORCO/VICODIN) 5-325 MG tablet Take 1 tablet by mouth every 4 (four) hours as needed for moderate pain. 05/25/21 05/25/22 Yes Fransico Meadow, PA-C  allopurinol (ZYLOPRIM) 300 MG tablet Take 300 mg by mouth daily. 01/27/19   [provider]  colchicine 0.6 MG tablet Take 0.6 mg by mouth daily.    [provider]  Cyanocobalamin (VITAMIN B 12)  100 MCG LOZG Take by mouth daily.    [provider]  ferrous sulfate 325 (65 FE) MG tablet Take 325 mg by mouth 2 (two) times daily with a meal.    [provider]  metoprolol tartrate (LOPRESSOR) 25 MG tablet TAKE 1 TABLET BY MOUTH TWICE A DAY 05/14/21   Loel Dubonnet, NP  rivaroxaban (XARELTO) 20 MG TABS tablet TAKE 1 TABLET BY MOUTH DAILY WITH SUPPER. 03/23/21   Loel Dubonnet, NP  tiZANidine (ZANAFLEX) 4 MG tablet Take 0.5-1 tablets (2-4 mg total) by mouth at bedtime as needed for muscle spasms. 04/03/21   Jaynee Eagles, PA-C    Family History Family History  Problem Relation Age of Onset   Stroke Father    Healthy  Mother    Healthy Brother    Healthy Son    Colon cancer Neg Hx    Prostate cancer Neg Hx     Social History Social History   Tobacco Use   Smoking status: Never   Smokeless tobacco: Current    Types: Chew  Substance Use Topics   Alcohol use: No   Drug use: No     Allergies   Patient has no known allergies.   Review of Systems Review of Systems  Constitutional:  Negative for fever.  Genitourinary:  Negative for bladder incontinence and dysuria.  Musculoskeletal:  Positive for back pain.  All other systems reviewed and are negative.   Physical Exam Triage Vital Signs ED Triage Vitals  Enc Vitals Group     BP 05/25/21 0849 (!) 168/96     Pulse Rate 05/25/21 0849 85     Resp 05/25/21 0849 16     Temp 05/25/21 0849 98.2 F (36.8 C)     Temp Source 05/25/21 0849 Oral     SpO2 05/25/21 0849 94 %     Weight --      Height --      Head Circumference --      Peak Flow --      Pain Score 05/25/21 0855 7     Pain Loc --      Pain Edu? --      Excl. in [place]? --    No data found.  Updated Vital Signs BP (!) 168/96 (BP Location: Left Arm)   Pulse 85   Temp 98.2 F (36.8 C) (Oral)   Resp 16   SpO2 94%   Visual Acuity Right Eye Distance:   Left Eye Distance:   Bilateral Distance:    Right Eye Near:   Left Eye Near:    Bilateral Near:     Physical Exam Vitals and nursing note reviewed.  Constitutional:      General: He is not in acute distress.    Appearance: He is well-developed.  HENT:     Head: Normocephalic and atraumatic.  Eyes:     Conjunctiva/sclera: Conjunctivae normal.  Cardiovascular:     Rate and Rhythm: Normal rate and regular rhythm.     Heart sounds: No murmur heard. Pulmonary:     Effort: Pulmonary effort is normal. No respiratory distress.     Breath sounds: Normal breath sounds.  Abdominal:     Palpations: Abdomen is soft.     Tenderness: There is no abdominal tenderness.  Musculoskeletal:     Cervical back: Neck supple.   Skin:    General: Skin is warm and dry.     Capillary Refill: Capillary refill takes less than 2 seconds.  Neurological:     Mental Status: He is alert.  Psychiatric:        Mood and Affect: Mood normal.     UC Treatments / Results  Labs (all labs ordered are listed, but only abnormal results are displayed) Labs Reviewed  POCT URINALYSIS DIP (MANUAL ENTRY) - Abnormal; Notable for the following components:      Result Value   Clarity, UA cloudy (*)    Blood, UA large (*)    Protein Ur, POC =30 (*)    Leukocytes, UA Small (1+) (*)    All other components within normal limits    EKG   Radiology No results found.  Procedures Procedures (including critical care time)  Medications Ordered in UC Medications - No data to display  Initial Impression / Assessment and Plan / UC Course  I have reviewed the triage vital signs and the nursing notes.  Pertinent labs & imaging results that were available during my care of the patient were reviewed by me and considered in my medical decision making (see chart for details).     MDM:  Ua shows hematuria.  I counseled pt on possibility of stone.  Pt advised to go to [facility] ED if symptoms worsen.  Pt advised to schedule to see Urologist for evaluation  Final Clinical Impressions(s) / UC Diagnoses   Final diagnoses:  Hematuria, unspecified type  Acute left-sided back pain, unspecified back location     Discharge Instructions      Return if any problems.     ED Prescriptions     Medication Sig Dispense Auth. Provider   HYDROcodone-acetaminophen (NORCO/VICODIN) 5-325 MG tablet Take 1 tablet by mouth every 4 (four) hours as needed for moderate pain. 16 tablet Elson Areas, [place]      I have reviewed the PDMP during this encounter. An After Visit Summary was printed and given to the patient.    Elson Areas, [place] 05/25/21 1142

## 2021-05-25 NOTE — ED Triage Notes (Addendum)
Tuesday was raking leaves and believes he may have hurt the left side of his lower back. Was using a backpack blower. Denies changes to bowel or bladder habits, denies numbness and tingling down legs. Pain reproducable with palpation. Hx of kidney stones, but states the pain doesn't feel like that. Describes it as having started in the center of his back and wraps around to the front of his stomach. No visible rash, but also denies having had shingles vaccine.

## 2021-05-28 DIAGNOSIS — R1084 Generalized abdominal pain: Secondary | ICD-10-CM | POA: Diagnosis not present

## 2021-05-28 DIAGNOSIS — R3121 Asymptomatic microscopic hematuria: Secondary | ICD-10-CM | POA: Diagnosis not present

## 2021-05-28 DIAGNOSIS — N200 Calculus of kidney: Secondary | ICD-10-CM | POA: Diagnosis not present

## 2021-06-13 DIAGNOSIS — R1084 Generalized abdominal pain: Secondary | ICD-10-CM | POA: Diagnosis not present

## 2021-06-13 DIAGNOSIS — R3121 Asymptomatic microscopic hematuria: Secondary | ICD-10-CM | POA: Diagnosis not present

## 2021-06-13 DIAGNOSIS — N200 Calculus of kidney: Secondary | ICD-10-CM | POA: Diagnosis not present

## 2021-08-09 DIAGNOSIS — N200 Calculus of kidney: Secondary | ICD-10-CM | POA: Diagnosis not present

## 2021-08-09 DIAGNOSIS — R3121 Asymptomatic microscopic hematuria: Secondary | ICD-10-CM | POA: Diagnosis not present

## 2021-09-21 ENCOUNTER — Other Ambulatory Visit: Payer: Self-pay | Admitting: Family

## 2021-09-21 DIAGNOSIS — Z7901 Long term (current) use of anticoagulants: Secondary | ICD-10-CM

## 2021-09-21 DIAGNOSIS — I480 Paroxysmal atrial fibrillation: Secondary | ICD-10-CM

## 2021-09-21 NOTE — Telephone Encounter (Signed)
Refill request for Xarelto 

## 2021-09-21 NOTE — Telephone Encounter (Signed)
Please schedule overdue F/u appointment for refills. Thank you! ?

## 2021-09-21 NOTE — Telephone Encounter (Signed)
Xarelto 20 mg refill request received. Pt is 71 years old, weight- 108.9 kg, Crea- 1.07 on 04/12/20, last seen by Gillian Shields, NP on 04/12/20, Diagnosis- afib, CrCl- 98.95; pt is overdue for labs and appt w/ Dr. Kirke Corin.  Will route to scheduling.  ?

## 2021-09-24 NOTE — Telephone Encounter (Signed)
Attempted to schedule.  LMOV to call office.  ° °

## 2021-10-15 ENCOUNTER — Telehealth: Payer: Self-pay | Admitting: Cardiovascular Disease

## 2021-10-15 DIAGNOSIS — I480 Paroxysmal atrial fibrillation: Secondary | ICD-10-CM

## 2021-10-15 MED ORDER — METOPROLOL TARTRATE 25 MG PO TABS: 25.0000 mg | ORAL_TABLET | Freq: Two times a day (BID) | ORAL | 1 refills | Status: DC

## 2021-10-15 NOTE — Telephone Encounter (Signed)
?*  STAT* If patient is at the pharmacy, call can be transferred to refill team. ? ? ?1. Which medications need to be refilled? (please list name of each medication and dose if known)  ? metoprolol tartrate (LOPRESSOR) 25 MG tablet  ? ?rivaroxaban (XARELTO) 20 MG TABS tablet ?2. Which pharmacy/location (including street and city if local pharmacy) is medication to be sent to? Preferred Pharmacies    ? ?[id number] [place], [place] [address] [place]  ? ? ?3. Do they need a 30 day or 90 day supply? 90 day ? ?Pt has appt schedule with Dr. Fletcher Anon on 12/04/21 ? ?

## 2021-10-15 NOTE — Telephone Encounter (Signed)
Requested Prescriptions  ? ?Signed Prescriptions Disp Refills  ? metoprolol tartrate (LOPRESSOR) 25 MG tablet 60 tablet 1  ?  Sig: Take 1 tablet (25 mg total) by mouth 2 (two) times daily. NO FURTHER REFILLS UNTIL SEEN IN OFFICE.  ?  Authorizing Provider: Lorine Bears A  ?  Ordering User: NEWCOMER MCCLAIN, Ikeya Brockel L  ? ? ?

## 2021-10-22 ENCOUNTER — Telehealth: Payer: Self-pay | Admitting: Cardiovascular Disease

## 2021-10-22 ENCOUNTER — Other Ambulatory Visit: Payer: Self-pay

## 2021-10-22 DIAGNOSIS — Z7901 Long term (current) use of anticoagulants: Secondary | ICD-10-CM

## 2021-10-22 MED ORDER — RIVAROXABAN 20 MG PO TABS: 20.0000 mg | ORAL_TABLET | Freq: Every day | ORAL | 0 refills | Status: DC

## 2021-10-22 NOTE — Telephone Encounter (Signed)
Prescription refill request for Xarelto received.  ?Indication:Afib ?Last office visit:upcoming ?Weight:108.9 kg ?Age:71 ?Scr:1.0 ?CrCl:105.88 ml/min ? ?Prescription refilled ? ?

## 2021-10-22 NOTE — Telephone Encounter (Signed)
?*  STAT* If patient is at the pharmacy, call can be transferred to refill team. ? ? ?1. Which medications need to be refilled? (please list name of each medication and dose if known)  ? rivaroxaban (XARELTO) 20 MG TABS tablet  ? ? ?2. Which pharmacy/location (including street and city if local pharmacy) is medication to be sent to? [id number] [place], [place] [address] ? ?3. Do they need a 30 day or 90 day supply? 90 day ? ?

## 2021-10-22 NOTE — Telephone Encounter (Signed)
Refill request

## 2021-10-31 NOTE — Telephone Encounter (Signed)
Scheduled

## 2021-11-14 DIAGNOSIS — M1991 Primary osteoarthritis, unspecified site: Secondary | ICD-10-CM | POA: Diagnosis not present

## 2021-11-14 DIAGNOSIS — Z79899 Other long term (current) drug therapy: Secondary | ICD-10-CM | POA: Diagnosis not present

## 2021-11-14 DIAGNOSIS — M1009 Idiopathic gout, multiple sites: Secondary | ICD-10-CM | POA: Diagnosis not present

## 2021-11-14 DIAGNOSIS — Z6828 Body mass index (BMI) 28.0-28.9, adult: Secondary | ICD-10-CM | POA: Diagnosis not present

## 2021-11-14 DIAGNOSIS — E663 Overweight: Secondary | ICD-10-CM | POA: Diagnosis not present

## 2021-12-04 ENCOUNTER — Ambulatory Visit: Payer: Medicare HMO | Admitting: Cardiovascular Disease

## 2021-12-04 ENCOUNTER — Other Ambulatory Visit
Admission: RE | Admit: 2021-12-04 | Discharge: 2021-12-04 | Disposition: A | Discharge status: Home / Self Care | Payer: Medicare HMO | Attending: Cardiovascular Disease | Admitting: Cardiovascular Disease

## 2021-12-04 ENCOUNTER — Encounter: Payer: Self-pay | Admitting: Cardiovascular Disease

## 2021-12-04 VITALS — BP 122/64 | HR 74 | Ht 70.0 in | Wt 202.0 lb

## 2021-12-04 DIAGNOSIS — I350 Nonrheumatic aortic (valve) stenosis: Secondary | ICD-10-CM | POA: Diagnosis not present

## 2021-12-04 DIAGNOSIS — I480 Paroxysmal atrial fibrillation: Secondary | ICD-10-CM | POA: Insufficient documentation

## 2021-12-04 DIAGNOSIS — I340 Nonrheumatic mitral (valve) insufficiency: Secondary | ICD-10-CM

## 2021-12-04 DIAGNOSIS — D508 Other iron deficiency anemias: Secondary | ICD-10-CM | POA: Diagnosis not present

## 2021-12-04 LAB — BASIC METABOLIC PANEL
Anion gap: 8 (ref 5–15)
BUN: 18 mg/dL (ref 8–23)
CO2: 27 mmol/L (ref 22–32)
Calcium: 8.7 mg/dL — ABNORMAL LOW (ref 8.9–10.3)
Chloride: 105 mmol/L (ref 98–111)
Creatinine, Ser: 0.97 mg/dL (ref 0.61–1.24)
GFR, Estimated: >60 mL/min (ref >60)
Glucose, Bld: 154 mg/dL — ABNORMAL HIGH (ref 70–99)
Potassium: 3.9 mmol/L (ref 3.5–5.1)
Sodium: 140 mmol/L (ref 135–145)

## 2021-12-04 LAB — CBC WITH DIFFERENTIAL/PLATELET
Abs Immature Granulocytes: 0.01 10*3/uL (ref 0.00–0.07)
Basophils Absolute: 0 10*3/uL (ref 0.0–0.1)
Basophils Relative: 1 %
Eosinophils Absolute: 0.1 10*3/uL (ref 0.0–0.5)
Eosinophils Relative: 4 %
HCT: 33.5 % — ABNORMAL LOW (ref 39.0–52.0)
Hemoglobin: 10.4 g/dL — ABNORMAL LOW (ref 13.0–17.0)
Immature Granulocytes: 0 %
Lymphocytes Relative: 13 %
Lymphs Abs: 0.5 10*3/uL — ABNORMAL LOW (ref 0.7–4.0)
MCH: 27.5 pg (ref 26.0–34.0)
MCHC: 31 g/dL (ref 30.0–36.0)
MCV: 88.6 fL (ref 80.0–100.0)
Monocytes Absolute: 0.4 10*3/uL (ref 0.1–1.0)
Monocytes Relative: 10 %
Neutro Abs: 2.7 10*3/uL (ref 1.7–7.7)
Neutrophils Relative %: 72 %
Platelets: 99 10*3/uL — ABNORMAL LOW (ref 150–400)
RBC: 3.78 MIL/uL — ABNORMAL LOW (ref 4.22–5.81)
RDW: 13.2 % (ref 11.5–15.5)
WBC: 3.7 10*3/uL — ABNORMAL LOW (ref 4.0–10.5)
nRBC: 0 % (ref 0.0–0.2)

## 2021-12-04 NOTE — Patient Instructions (Signed)
Medication Instructions:  Your physician recommends that you continue on your current medications as directed. Please refer to the Current Medication list given to you today.  *If you need a refill on your cardiac medications before your next appointment, please call your pharmacy*   Lab Work: Bmp and Cbc today. Please have your labs drawn at the [facility]. Stop at the Registration desk to check in.  If you have labs (blood work) drawn today and your tests are completely normal, you will receive your results only by: MyChart Message (if you have MyChart) OR A paper copy in the mail If you have any lab test that is abnormal or we need to change your treatment, we will call you to review the results.   Testing/Procedures: Your physician has requested that you have an echocardiogram. Echocardiography is a painless test that uses sound waves to create images of your heart. It provides your doctor with information about the size and shape of your heart and how well your heart's chambers and valves are working. This procedure takes approximately one hour. There are no restrictions for this procedure. (To be scheduled in 6 months)   Follow-Up: At [facility], you and your health needs are our priority.  As part of our continuing mission to provide you with exceptional heart care, we have created designated Provider Care Teams.  These Care Teams include your primary Cardiologist (physician) and Advanced Practice Providers (APPs -  Physician Assistants and Nurse Practitioners) who all work together to provide you with the care you need, when you need it.  We recommend signing up for the patient portal called "MyChart".  Sign up information is provided on this After Visit Summary.  MyChart is used to connect with patients for Virtual Visits (Telemedicine).  Patients are able to view lab/test results, encounter notes, upcoming appointments, etc.  Non-urgent messages can be sent to your provider as  well.   To learn more about what you can do with MyChart, go to [email or web address].    Your next appointment:   6 month(s) (To be scheduled after the echo)  The format for your next appointment:   In Person  Provider:   You may see Lorine Bears, MD or one of the following Advanced Practice Providers on your designated Care Team:   Nicolasa Ducking, NP Eula Listen, PA-C Cadence Fransico Michael, [place]   Other Instructions N/A  Important Information About Sugar

## 2021-12-04 NOTE — Progress Notes (Signed)
Cardiology Office Note   Date:  12/04/2021   ID:  Joel Ford, DOB [birth year], MRN [id number]  PCP:  Patient, No Pcp Per (Inactive)  Cardiologist:   Kathlyn Sacramento, MD   Chief Complaint  Patient presents with   Follow-up    6 month follow up. Patient states that he is well, but can't sleep at night      History of Present Illness: Joel Ford is a 71 y.o. male who presents for a follow-up visit regarding paroxysmal atrial fibrillation. He was hospitalized in January 2018 due to A. fib with RVR. Echocardiogram showed normal LV systolic function, mild aortic stenosis, mild to moderate mitral regurgitation, mild to moderately dilated left atrium and mild pulmonary hypertension. He was treated with diltiazem and was started on anticoagulation with Xarelto.  He underwent a treadmill nuclear stress test in February 2018 which showed no evidence of ischemia with normal ejection fraction.   He has been doing well with no recent chest pain, worsening shortness of breath or palpitations.  He reports difficulty sleeping at night and does not sleep until 2:00 in the morning.  His wife reports loud snoring.  He has not been tested for sleep apnea.  Past Medical History:  Diagnosis Date   ABNORMAL ELECTROCARDIOGRAM 07/04/2009   Qualifier: Diagnosis of  By: Maxie Better FNP, Rosalita Levan    Atrial fibrillation ([place])    Gout    HYPERGLYCEMIA 07/27/2009   Qualifier: Diagnosis of  By: Council Mechanic MD, Hilaria Ota    HYPERLIPIDEMIA 07/04/2009   Qualifier: Diagnosis of  By: Maxie Better FNP, [name]    Left knee DJD    OA   RENAL CALCULUS 02/15/2008   Qualifier: Diagnosis of  By: Maxie Better FNP, Rosalita Levan    S/P total knee replacement 08/07/2009   right   Thyroid disease     Past Surgical History:  Procedure Laterality Date   APPENDECTOMY     age 70   CARDIAC CATHETERIZATION  01/23/2005   Normal   FINGER SURGERY     RIGHT INDEX, REATTACHED   orthoscopy     Right and left knee    TOTAL KNEE ARTHROPLASTY  2011   Right   TOTAL KNEE ARTHROPLASTY Left 09/21/2012   Dr Noemi Chapel   TOTAL KNEE ARTHROPLASTY Left 09/21/2012   Procedure: TOTAL KNEE ARTHROPLASTY;  Surgeon: Lorn Junes, MD;  Location: [place];  Service: Orthopedics;  Laterality: Left;     Current Outpatient Medications  Medication Sig Dispense Refill   allopurinol (ZYLOPRIM) 300 MG tablet Take 300 mg by mouth daily.     colchicine 0.6 MG tablet Take 0.6 mg by mouth daily.     Cyanocobalamin (VITAMIN B 12) 100 MCG LOZG Take by mouth daily.     ferrous sulfate 325 (65 FE) MG tablet Take 325 mg by mouth 2 (two) times daily with a meal.     HYDROcodone-acetaminophen (NORCO/VICODIN) 5-325 MG tablet Take 1 tablet by mouth every 4 (four) hours as needed for moderate pain. 16 tablet 0   metoprolol tartrate (LOPRESSOR) 25 MG tablet Take 1 tablet (25 mg total) by mouth 2 (two) times daily. NO FURTHER REFILLS UNTIL SEEN IN OFFICE. 60 tablet 1   rivaroxaban (XARELTO) 20 MG TABS tablet Take 1 tablet (20 mg total) by mouth daily with supper. 90 tablet 0   tiZANidine (ZANAFLEX) 4 MG tablet Take 0.5-1 tablets (2-4 mg total) by mouth at bedtime as needed for muscle spasms. 30 tablet 0  No current facility-administered medications for this visit.    Allergies:   Patient has no known allergies.    Social History:  The patient  reports that he has never smoked. His smokeless tobacco use includes chew. He reports that he does not drink alcohol and does not use drugs.   Family History:  The patient's family history includes Healthy in his brother, mother, and son; Stroke in his father.    ROS:  Please see the history of present illness.   Otherwise, review of systems are positive for none.   All other systems are reviewed and negative.    PHYSICAL EXAM: VS:  BP 122/64 (BP Location: Left Arm, Patient Position: Sitting, Cuff Size: Normal)   Pulse 74   Ht 5\' 10"  (1.778 m)   Wt 202 lb (91.6 kg)   SpO2 98%   BMI 28.98 kg/m  ,  BMI Body mass index is 28.98 kg/m. GEN: Well nourished, well developed, in no acute distress  HEENT: normal  Neck: no JVD, carotid bruits, or masses Cardiac: RRR; no  rubs, or gallops,no edema . 2/6 systolic ejection murmur in the aortic area which is early peaking. Respiratory:  clear to auscultation bilaterally, normal work of breathing GI: soft, nontender, nondistended, + BS MS: no deformity or atrophy  Skin: warm and dry, no rash Neuro:  Strength and sensation are intact Psych: euthymic mood, full affect   EKG:  EKG is  ordered today. EKG showed normal sinus rhythm with T wave inversion in the anterolateral leads.  This is chronic.   Recent Labs: No results found for requested labs within last 8760 hours.    Lipid Panel    Component Value Date/Time   CHOL 204 (H) 03/03/2017 0921   TRIG 173 (H) 03/03/2017 0921   HDL 34 (L) 03/03/2017 0921   CHOLHDL 6.0 (H) 03/03/2017 0921   CHOLHDL 7 07/04/2009 1014   VLDL 32.2 07/04/2009 1014   LDLCALC 135 (H) 03/03/2017 0921   LDLDIRECT 179.9 07/04/2009 1014      Wt Readings from Last 3 Encounters:  12/04/21 202 lb (91.6 kg)  12/26/20 240 lb (108.9 kg)  04/12/20 201 lb (91.2 kg)           View : No data to display.            ASSESSMENT AND PLAN:   1.  Paroxysmal atrial fibrillation: He has no symptoms of palpitations and seem to be in sinus rhythm.  Continue metoprolol tartrate 25 mg twice daily.  Continue anticoagulation with Xarelto.  I requested CBC and basic metabolic profile.    2.  History of iron deficiency anemia: Check CBC today.  3. Mild aortic stenosis and mild to moderate mitral regurgitation: I requested a follow-up echocardiogram to be done in 6 months from now.  4.  Possible sleep apnea: I suggested a sleep study but he declined.    Disposition:   FU with me in 12 months  Signed,  Kathlyn Sacramento, MD  12/04/2021 3:00 PM    [place]

## 2021-12-06 ENCOUNTER — Telehealth: Payer: Self-pay | Admitting: Emergency Medicine

## 2021-12-06 DIAGNOSIS — D72819 Decreased white blood cell count, unspecified: Secondary | ICD-10-CM

## 2021-12-06 DIAGNOSIS — D649 Anemia, unspecified: Secondary | ICD-10-CM

## 2021-12-06 DIAGNOSIS — D696 Thrombocytopenia, unspecified: Secondary | ICD-10-CM

## 2021-12-06 NOTE — Telephone Encounter (Signed)
-----   Message from Iran Ouch, MD sent at 12/06/2021 10:48 AM EDT ----- Inform patient that labs showed normal renal function.  He continues to be anemic with low white cell count and low platelet count.  Recommend referral to hematology for evaluation of this.  He also needs to establish with a primary care physician.

## 2021-12-06 NOTE — Telephone Encounter (Signed)
Called patient, went over results and recommendations. Pt verbalized understanding, and questions, if any, were answered.   Referral placed for hematology.

## 2021-12-13 ENCOUNTER — Ambulatory Visit
Admission: EM | Admit: 2021-12-13 | Discharge: 2021-12-13 | Disposition: A | Discharge status: Home / Self Care | Payer: Medicare HMO | Attending: Internal Medicine | Admitting: Internal Medicine

## 2021-12-13 DIAGNOSIS — S39012A Strain of muscle, fascia and tendon of lower back, initial encounter: Secondary | ICD-10-CM

## 2021-12-13 MED ORDER — TIZANIDINE HCL 4 MG PO TABS: 2.0000 mg | ORAL_TABLET | Freq: Three times a day (TID) | ORAL | 0 refills | Status: DC | PRN

## 2021-12-13 MED ORDER — PREDNISONE 20 MG PO TABS: 20.0000 mg | ORAL_TABLET | Freq: Every day | ORAL | 0 refills | Status: AC

## 2021-12-13 NOTE — ED Provider Notes (Signed)
[facility]    CSN: [id number] Arrival date & time: 12/13/21  [id number]      History   Chief Complaint Chief Complaint  Patient presents with   Back Pain    HPI Joel Ford is a 71 y.o. male.   Patient presents with lower back pain that started approximately 4 days ago.  Patient reports the pain started after bending over to tie his shoes.  Having lower back pain.  Denies any numbness or tingling.  Pain does not radiate down legs.  Denies urinary frequency, urinary or bowel continence, saddle anesthesia.  Patient has taken leftover muscle relaxers with some improvement in pain.   Back Pain   Past Medical History:  Diagnosis Date   ABNORMAL ELECTROCARDIOGRAM 07/04/2009   Qualifier: Diagnosis of  By: Maxie Better FNP, Rosalita Levan    Atrial fibrillation ([place])    Gout    HYPERGLYCEMIA 07/27/2009   Qualifier: Diagnosis of  By: Council Mechanic MD, Hilaria Ota    HYPERLIPIDEMIA 07/04/2009   Qualifier: Diagnosis of  By: Maxie Better FNP, [name]    Left knee DJD    OA   RENAL CALCULUS 02/15/2008   Qualifier: Diagnosis of  By: Maxie Better FNP, Rosalita Levan    S/P total knee replacement 08/07/2009   right   Thyroid disease     Patient Active Problem List   Diagnosis Date Noted   Tick bite 11/19/2018   Essential hypertension 03/03/2017   Health care maintenance 09/03/2016   New onset atrial fibrillation ([place]) 08/06/2016   B12 deficiency anemia 08/06/2016   Anemia, iron deficiency 08/06/2016   Hypothyroidism 08/06/2016   Paroxysmal atrial fibrillation ([place]) 08/05/2016   Gout    Left knee DJD    Preop cardiovascular exam 09/06/2012   Arthritis of knee, degenerative 08/30/2012   EXTERNAL HEMORRHOIDS 12/26/2009   S/P total knee replacement 08/07/2009   HYPERGLYCEMIA 07/27/2009   Hyperlipidemia 07/04/2009   ABNORMAL ELECTROCARDIOGRAM 07/04/2009   CHEST WALL PAIN, ACUTE 08/30/2008   RENAL CALCULUS 02/15/2008   Thoracic or lumbosacral neuritis or radiculitis  02/15/2008   GOUT 01/22/2008    Past Surgical History:  Procedure Laterality Date   APPENDECTOMY     age 37   CARDIAC CATHETERIZATION  01/23/2005   Normal   FINGER SURGERY     RIGHT INDEX, REATTACHED   orthoscopy     Right and left knee   TOTAL KNEE ARTHROPLASTY  2011   Right   TOTAL KNEE ARTHROPLASTY Left 09/21/2012   Dr Noemi Chapel   TOTAL KNEE ARTHROPLASTY Left 09/21/2012   Procedure: TOTAL KNEE ARTHROPLASTY;  Surgeon: Lorn Junes, MD;  Location: [facility];  Service: Orthopedics;  Laterality: Left;       Home Medications    Prior to Admission medications   Medication Sig Start Date End Date Taking? Authorizing Provider  predniSONE (DELTASONE) 20 MG tablet Take 1 tablet (20 mg total) by mouth daily for 5 days. 12/13/21 12/18/21 Yes Nishawn Rotan, Michele Rockers, FNP  tiZANidine (ZANAFLEX) 4 MG tablet Take 0.5 tablets (2 mg total) by mouth every 8 (eight) hours as needed for muscle spasms. 12/13/21  Yes Alee Gressman, Hildred Alamin E, FNP  allopurinol (ZYLOPRIM) 300 MG tablet Take 300 mg by mouth daily. 01/27/19   [provider]  colchicine 0.6 MG tablet Take 0.6 mg by mouth daily.    [provider]  Cyanocobalamin (VITAMIN B 12) 100 MCG LOZG Take by mouth daily.    [provider]  ferrous sulfate 325 (65 FE) MG  tablet Take 325 mg by mouth 2 (two) times daily with a meal.    [provider]  HYDROcodone-acetaminophen (NORCO/VICODIN) 5-325 MG tablet Take 1 tablet by mouth every 4 (four) hours as needed for moderate pain. 05/25/21 05/25/22  Fransico Meadow, PA-C  metoprolol tartrate (LOPRESSOR) 25 MG tablet Take 1 tablet (25 mg total) by mouth 2 (two) times daily. NO FURTHER REFILLS UNTIL SEEN IN OFFICE. 10/15/21   Wellington Hampshire, MD  rivaroxaban (XARELTO) 20 MG TABS tablet Take 1 tablet (20 mg total) by mouth daily with supper. 10/22/21   Wellington Hampshire, MD    Family History Family History  Problem Relation Age of Onset   Stroke Father    Healthy Mother    Healthy Brother     Healthy Son    Colon cancer Neg Hx    Prostate cancer Neg Hx     Social History Social History   Tobacco Use   Smoking status: Never   Smokeless tobacco: Current    Types: Chew  Substance Use Topics   Alcohol use: No   Drug use: No     Allergies   Patient has no known allergies.   Review of Systems Review of Systems Per HPI  Physical Exam Triage Vital Signs ED Triage Vitals  Enc Vitals Group     BP 12/13/21 1040 108/68     Pulse Rate 12/13/21 1040 63     Resp 12/13/21 1040 18     Temp 12/13/21 1040 98.9 F (37.2 C)     Temp Source 12/13/21 1040 Oral     SpO2 12/13/21 1040 99 %     Weight --      Height --      Head Circumference --      Peak Flow --      Pain Score 12/13/21 1041 8     Pain Loc --      Pain Edu? --      Excl. in [place]? --    No data found.  Updated Vital Signs BP 108/68 (BP Location: Right Arm)   Pulse 63   Temp 98.9 F (37.2 C) (Oral)   Resp 18   SpO2 99%   Visual Acuity Right Eye Distance:   Left Eye Distance:   Bilateral Distance:    Right Eye Near:   Left Eye Near:    Bilateral Near:     Physical Exam Constitutional:      General: He is not in acute distress.    Appearance: Normal appearance. He is not toxic-appearing or diaphoretic.  HENT:     Head: Normocephalic and atraumatic.  Eyes:     Extraocular Movements: Extraocular movements intact.     Conjunctiva/sclera: Conjunctivae normal.  Pulmonary:     Effort: Pulmonary effort is normal.  Musculoskeletal:     Cervical back: Normal.     Thoracic back: Normal.       Back:     Comments: Tenderness to palpation to paraspinal muscles of left lower lumbar region.  No direct spinal tenderness, crepitus, step-off.  Neurological:     General: No focal deficit present.     Mental Status: He is alert and oriented to person, place, and time. Mental status is at baseline.     Deep Tendon Reflexes: Reflexes are normal and symmetric.  Psychiatric:        Mood and Affect: Mood  normal.        Behavior: Behavior normal.  Thought Content: Thought content normal.        Judgment: Judgment normal.      UC Treatments / Results  Labs (all labs ordered are listed, but only abnormal results are displayed) Labs Reviewed - No data to display  EKG   Radiology No results found.  Procedures Procedures (including critical care time)  Medications Ordered in UC Medications - No data to display  Initial Impression / Assessment and Plan / UC Course  I have reviewed the triage vital signs and the nursing notes.  Pertinent labs & imaging results that were available during my care of the patient were reviewed by me and considered in my medical decision making (see chart for details).     Suspect lumbar strain.  Do not think imaging is necessary given no obvious direct spinal tenderness or crepitus.  Will treat with muscle relaxers as patient has been taken this before and tolerated well. Advised patient this can cause drowsiness. Will prescribe low-dose given patient's age.  Limited options on pain management given patient takes blood thinning medications.  Unable to prescribe NSAIDs.  Will do low-dose and short course of prednisone as patient has taken this before and tolerated well.  Last known EF was normal. Should be safe with Xarelto.  Discussed alternating ice and heat to affected area as well.  Patient to follow-up if symptoms persist or worsen.  Patient verbalized understanding and was agreeable with plan. Final Clinical Impressions(s) / UC Diagnoses   Final diagnoses:  Strain of lumbar region, initial encounter     Discharge Instructions      You have a lower back strain which is being treated with muscle relaxers and prednisone steroid.  Please be advised that muscle relaxer can cause drowsiness.  Do not take any ibuprofen, Advil, Aleve given that you take blood thinners.  Please follow-up if symptoms persist or worsen.    ED Prescriptions      Medication Sig Dispense Auth. Provider   predniSONE (DELTASONE) 20 MG tablet Take 1 tablet (20 mg total) by mouth daily for 5 days. 5 tablet [place], [place], [place]   tiZANidine (ZANAFLEX) 4 MG tablet Take 0.5 tablets (2 mg total) by mouth every 8 (eight) hours as needed for muscle spasms. 20 tablet [place], Michele Rockers, [place]      PDMP not reviewed this encounter.   Teodora Medici, Theresa 12/13/21 1141

## 2021-12-13 NOTE — ED Triage Notes (Signed)
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

## 2021-12-13 NOTE — Discharge Instructions (Signed)
You have a lower back strain which is being treated with muscle relaxers and prednisone steroid.  Please be advised that muscle relaxer can cause drowsiness.  Do not take any ibuprofen, Advil, Aleve given that you take blood thinners.  Please follow-up if symptoms persist or worsen.

## 2021-12-19 ENCOUNTER — Encounter: Payer: Self-pay | Admitting: Physician Assistant

## 2021-12-19 ENCOUNTER — Ambulatory Visit (INDEPENDENT_AMBULATORY_CARE_PROVIDER_SITE_OTHER): Payer: Medicare HMO | Admitting: Physician Assistant

## 2021-12-19 ENCOUNTER — Ambulatory Visit (INDEPENDENT_AMBULATORY_CARE_PROVIDER_SITE_OTHER): Payer: Medicare HMO

## 2021-12-19 VITALS — BP 123/66 | HR 64 | Ht 70.0 in | Wt 197.0 lb

## 2021-12-19 DIAGNOSIS — D696 Thrombocytopenia, unspecified: Secondary | ICD-10-CM

## 2021-12-19 DIAGNOSIS — M545 Low back pain, unspecified: Secondary | ICD-10-CM | POA: Diagnosis not present

## 2021-12-19 DIAGNOSIS — I480 Paroxysmal atrial fibrillation: Secondary | ICD-10-CM | POA: Diagnosis not present

## 2021-12-19 DIAGNOSIS — D649 Anemia, unspecified: Secondary | ICD-10-CM | POA: Diagnosis not present

## 2021-12-19 DIAGNOSIS — D72819 Decreased white blood cell count, unspecified: Secondary | ICD-10-CM | POA: Diagnosis not present

## 2021-12-19 MED ORDER — TRAMADOL HCL 50 MG PO TABS: 50.0000 mg | ORAL_TABLET | Freq: Four times a day (QID) | ORAL | 0 refills | Status: AC | PRN

## 2021-12-19 NOTE — Progress Notes (Signed)
New Patient Office Visit  Subjective    Patient ID: Joel Ford, male    DOB: August 05, 1950  Age: 71 y.o. MRN: [id number]  CC:  Chief Complaint  Patient presents with   Establish Care    HPI Joel Ford [facility] presents to establish care. He is with his wife today. He was seen by cardiology to follow up on PAF and some concern over decreased WBC, anemia, and thrombocytopenia. He denies any night sweats, fevers, chills. He does have some easy bruising on xarelto. He has lost some weight recently but was not eating because of kidney stone.   He is having some acute back pain. Hx of low back pain. He bent over to tie shows and had acute low back pain. Went to UC and treated with prednisone for 5 days and muscle relaxer. He is some better. But continues to have low back pain. Denies any radiation down leg or weakness. No saddle anesthesia or bowel or bladder dysfunction.    Outpatient Encounter Medications as of 12/19/2021  Medication Sig   allopurinol (ZYLOPRIM) 300 MG tablet Take 300 mg by mouth daily.   colchicine 0.6 MG tablet Take 0.6 mg by mouth daily.   rivaroxaban (XARELTO) 20 MG TABS tablet Take 1 tablet (20 mg total) by mouth daily with supper.   tiZANidine (ZANAFLEX) 4 MG tablet Take 0.5 tablets (2 mg total) by mouth every 8 (eight) hours as needed for muscle spasms.   traMADol (ULTRAM) 50 MG tablet Take 1 tablet (50 mg total) by mouth every 6 (six) hours as needed for up to 5 days.   [DISCONTINUED] metoprolol tartrate (LOPRESSOR) 25 MG tablet Take 1 tablet (25 mg total) by mouth 2 (two) times daily. NO FURTHER REFILLS UNTIL SEEN IN OFFICE.   [EXPIRED] predniSONE (DELTASONE) 20 MG tablet Take 1 tablet (20 mg total) by mouth daily for 5 days.   [DISCONTINUED] Cyanocobalamin (VITAMIN B 12) 100 MCG LOZG Take by mouth daily.   [DISCONTINUED] ferrous sulfate 325 (65 FE) MG tablet Take 325 mg by mouth 2 (two) times daily with a meal.   [DISCONTINUED] HYDROcodone-acetaminophen (NORCO/VICODIN)  5-325 MG tablet Take 1 tablet by mouth every 4 (four) hours as needed for moderate pain.   [DISCONTINUED] tamsulosin (FLOMAX) 0.4 MG CAPS capsule    No facility-administered encounter medications on file as of 12/19/2021.    Past Medical History:  Diagnosis Date   ABNORMAL ELECTROCARDIOGRAM 07/04/2009   Qualifier: Diagnosis of  By: Maxie Better FNP, Rosalita Levan    Atrial fibrillation ([place])    Gout    HYPERGLYCEMIA 07/27/2009   Qualifier: Diagnosis of  By: Council Mechanic MD, Hilaria Ota    HYPERLIPIDEMIA 07/04/2009   Qualifier: Diagnosis of  By: Maxie Better FNP, [name]    Left knee DJD    OA   RENAL CALCULUS 02/15/2008   Qualifier: Diagnosis of  By: Maxie Better FNP, Rosalita Levan    S/P total knee replacement 08/07/2009   right   Thyroid disease     Past Surgical History:  Procedure Laterality Date   APPENDECTOMY     age 64   CARDIAC CATHETERIZATION  01/23/2005   Normal   FINGER SURGERY     RIGHT INDEX, REATTACHED   orthoscopy     Right and left knee   TOTAL KNEE ARTHROPLASTY  2011   Right   TOTAL KNEE ARTHROPLASTY Left 09/21/2012   Dr Noemi Chapel   TOTAL KNEE ARTHROPLASTY Left 09/21/2012   Procedure: TOTAL KNEE ARTHROPLASTY;  Surgeon: Herbie Baltimore  Salley Slaughter, MD;  Location: [facility];  Service: Orthopedics;  Laterality: Left;    Family History  Problem Relation Age of Onset   Stroke Father    Healthy Mother    Healthy Brother    Healthy Son    Colon cancer Neg Hx    Prostate cancer Neg Hx     Social History   Socioeconomic History   Marital status: Married    Spouse name: Not on file   Number of children: 1   Years of education: Not on file   Highest education level: Not on file  Occupational History   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     [employer].     Married 1977   Social Determinants of Corporate investment banker Strain: Not on file  Food Insecurity: Not on file  Transportation Needs: Not on file  Physical Activity: Not on file  Stress: Not on file  Social Connections: Not on file  Intimate Partner Violence: Not on file    ROS   See HPI.    Objective    BP 123/66   Pulse 64   Ht 5\' 10"  (1.778 m)   Wt 197 lb (89.4 kg)   SpO2 97%   BMI 28.27 kg/m   Physical Exam Vitals reviewed.  Constitutional:      Appearance: Normal appearance.  HENT:     Head: Normocephalic.  Cardiovascular:     Rate and Rhythm: Normal rate and regular rhythm.     Pulses: Normal pulses.  Pulmonary:     Effort: Pulmonary effort is normal.     Breath sounds: Normal breath sounds.  Musculoskeletal:     Right lower leg: No edema.     Left lower leg: No edema.     Comments: Decreased ROM at waist due to pain.  No tenderness over lumbar spine Left paraspinal muscle tenderness and tightness Left low back pain with left SLR  Neurological:     General: No focal deficit present.     Mental Status: He is alert and oriented to person, place, and time.  Psychiatric:        Mood and Affect: Mood normal.        Assessment & Plan:  Marland KitchenMarland KitchenSalbador was seen today for establish care.  Diagnoses and all orders for this visit:  PAF (paroxysmal atrial fibrillation) (HCC)  Leukopenia, unspecified type -     CBC w/Diff/Platelet -     Fe+TIBC+Fer -     Pathologist smear review -     B12 and Folate Panel -     BASIC METABOLIC PANEL WITH GFR  Normocytic anemia -     CBC w/Diff/Platelet -     Fe+TIBC+Fer -     Pathologist smear review -     B12 and Folate Panel -     BASIC METABOLIC PANEL WITH GFR  Acute left-sided low back pain without sciatica -     traMADol (ULTRAM) 50 MG tablet; Take 1 tablet (50 mg total) by mouth every 6 (six) hours as  needed for up to 5 days. -  DG Lumbar Spine Complete; Future  Thrombocytopenia ([place])    Vitals look good today Will recheck CBC/ferritin/b12 due to leukopenia/anemia/thrombocytopenia Denies colonoscopy Denies vaccines  Discussed low back pain and likely DDD Xray orderd Continue muscle relaxer Discussed exercises  No red flag symptoms  Tramadol as needed for break through pain .Marland KitchenPDMP reviewed during this encounter.    Iran Planas, PA-C

## 2021-12-19 NOTE — Patient Instructions (Addendum)
Low Back Sprain or Strain Rehab Ask your health care provider which exercises are safe for you. Do exercises exactly as told by your health care provider and adjust them as directed. It is normal to feel mild stretching, pulling, tightness, or discomfort as you do these exercises. Stop right away if you feel sudden pain or your pain gets worse. Do not begin these exercises until told by your health care provider. Stretching and range-of-motion exercises These exercises warm up your muscles and joints and improve the movement and flexibility of your back. These exercises also help to relieve pain, numbness, and tingling. Lumbar rotation  Lie on your back on a firm bed or the floor with your knees bent. Straighten your arms out to your sides so each arm forms a 90-degree angle (right angle) with a side of your body. Slowly move (rotate) both of your knees to one side of your body until you feel a stretch in your lower back (lumbar). Try not to let your shoulders lift off the floor. Hold this position for __________ seconds. Tense your abdominal muscles and slowly move your knees back to the starting position. Repeat this exercise on the other side of your body. Repeat __________ times. Complete this exercise __________ times a day. Single knee to chest  Lie on your back on a firm bed or the floor with both legs straight. Bend one of your knees. Use your hands to move your knee up toward your chest until you feel a gentle stretch in your lower back and buttock. Hold your leg in this position by holding on to the front of your knee. Keep your other leg as straight as possible. Hold this position for __________ seconds. Slowly return to the starting position. Repeat with your other leg. Repeat __________ times. Complete this exercise __________ times a day. Prone extension on elbows  Lie on your abdomen on a firm bed or the floor (prone position). Prop yourself up on your elbows. Use your arms  to help lift your chest up until you feel a gentle stretch in your abdomen and your lower back. This will place some of your body weight on your elbows. If this is uncomfortable, try stacking pillows under your chest. Your hips should stay down, against the surface that you are lying on. Keep your hip and back muscles relaxed. Hold this position for __________ seconds. Slowly relax your upper body and return to the starting position. Repeat __________ times. Complete this exercise __________ times a day. Strengthening exercises These exercises build strength and endurance in your back. Endurance is the ability to use your muscles for a long time, even after they get tired. Pelvic tilt This exercise strengthens the muscles that lie deep in the abdomen. Lie on your back on a firm bed or the floor with your legs extended. Bend your knees so they are pointing toward the ceiling and your feet are flat on the floor. Tighten your lower abdominal muscles to press your lower back against the floor. This motion will tilt your pelvis so your tailbone points up toward the ceiling instead of pointing to your feet or the floor. To help with this exercise, you may place a small towel under your lower back and try to push your back into the towel. Hold this position for __________ seconds. Let your muscles relax completely before you repeat this exercise. Repeat __________ times. Complete this exercise __________ times a day. Alternating arm and leg raises  Get on your hands   and knees on a firm surface. If you are on a hard floor, you may want to use padding, such as an exercise mat, to cushion your knees. Line up your arms and legs. Your hands should be directly below your shoulders, and your knees should be directly below your hips. Lift your left leg behind you. At the same time, raise your right arm and straighten it in front of you. Do not lift your leg higher than your hip. Do not lift your arm higher  than your shoulder. Keep your abdominal and back muscles tight. Keep your hips facing the ground. Do not arch your back. Keep your balance carefully, and do not hold your breath. Hold this position for __________ seconds. Slowly return to the starting position. Repeat with your right leg and your left arm. Repeat __________ times. Complete this exercise __________ times a day. Abdominal set with straight leg raise  Lie on your back on a firm bed or the floor. Bend one of your knees and keep your other leg straight. Tense your abdominal muscles and lift your straight leg up, 4-6 inches (10-15 cm) off the ground. Keep your abdominal muscles tight and hold this position for __________ seconds. Do not hold your breath. Do not arch your back. Keep it flat against the ground. Keep your abdominal muscles tense as you slowly lower your leg back to the starting position. Repeat with your other leg. Repeat __________ times. Complete this exercise __________ times a day. Single leg lower with bent knees Lie on your back on a firm bed or the floor. Tense your abdominal muscles and lift your feet off the floor, one foot at a time, so your knees and hips are bent in 90-degree angles (right angles). Your knees should be over your hips and your lower legs should be parallel to the floor. Keeping your abdominal muscles tense and your knee bent, slowly lower one of your legs so your toe touches the ground. Lift your leg back up to return to the starting position. Do not hold your breath. Do not let your back arch. Keep your back flat against the ground. Repeat with your other leg. Repeat __________ times. Complete this exercise __________ times a day. Posture and body mechanics Good posture and healthy body mechanics can help to relieve stress in your body's tissues and joints. Body mechanics refers to the movements and positions of your body while you do your daily activities. Posture is part of body  mechanics. Good posture means: Your spine is in its natural S-curve position (neutral). Your shoulders are pulled back slightly. Your head is not tipped forward (neutral). Follow these guidelines to improve your posture and body mechanics in your everyday activities. Standing  When standing, keep your spine neutral and your feet about hip-width apart. Keep a slight bend in your knees. Your ears, shoulders, and hips should line up. When you do a task in which you stand in one place for a long time, place one foot up on a stable object that is 2-4 inches (5-10 cm) high, such as a footstool. This helps keep your spine neutral. Sitting  When sitting, keep your spine neutral and keep your feet flat on the floor. Use a footrest, if necessary, and keep your thighs parallel to the floor. Avoid rounding your shoulders, and avoid tilting your head forward. When working at a desk or a computer, keep your desk at a height where your hands are slightly lower than your elbows. Slide your   chair under your desk so you are close enough to maintain good posture. When working at a computer, place your monitor at a height where you are looking straight ahead and you do not have to tilt your head forward or downward to look at the screen. Resting When lying down and resting, avoid positions that are most painful for you. If you have pain with activities such as sitting, bending, stooping, or squatting, lie in a position in which your body does not bend very much. For example, avoid curling up on your side with your arms and knees near your chest (fetal position). If you have pain with activities such as standing for a long time or reaching with your arms, lie with your spine in a neutral position and bend your knees slightly. Try the following positions: Lying on your side with a pillow between your knees. Lying on your back with a pillow under your knees. Lifting  When lifting objects, keep your feet at least  shoulder-width apart and tighten your abdominal muscles. Bend your knees and hips and keep your spine neutral. It is important to lift using the strength of your legs, not your back. Do not lock your knees straight out. Always ask for help to lift heavy or awkward objects. This information is not intended to replace advice given to you by your health care provider. Make sure you discuss any questions you have with your health care provider. Document Revised: 09/11/2020 Document Reviewed: 09/11/2020 Elsevier Patient Education  2023 [employer]  Leukopenia  Leukopenia is a condition in which the body has a low number of white blood cells. White blood cells help the body to fight infections. The number of white blood cells in the body varies from person to person.  There are five types of white blood cells. Two types of cells, called lymphocytes andneutrophils, make up most of the white blood cell count. When lymphocytes are low, the condition is called lymphocytopenia. When neutrophils are low, the condition is called neutropenia. Neutropenia is the most dangerous type of leukopenia because it can lead to dangerous infections. Preventing infection is important if you have leukopenia. What are the causes? This condition is commonly caused by damage to soft tissue inside of the bones (bone marrow), which is where most white blood cells are made. Bone marrow can get damaged by: Medicine or X-ray treatments for cancer (chemotherapy or radiation therapy). Serious infections. Cancer of the white blood cells. Medicines, including: Certain antibiotics. Certain heart medicines. Steroids. Certain medicines used to treat diseases of the immune system (autoimmune diseases), such as rheumatoid arthritis. Leukopenia also happens when white blood cells are destroyed after leaving the bone marrow. This destruction can be caused by: Liver disease. Autoimmune disease. Vitamin B deficiencies. What are the  signs or symptoms? In some cases, there are no symptoms. If you have symptoms, they may include having a lot of bacterial infections. This is one of the most common signs of leukopenia, especially severe neutropenia. Different infections have different symptoms. They include: Coughing, if you have an infection in the lungs. Frequent urination and a burning sensation, if you have urinary tract infection. Infections of the blood, skin, rectum, throat, sinuses, or ears. Other symptoms of this condition include: Fever, sweating, or chills. Fatigue. Swollen glands (lymph nodes). Painful mouth sores. Gum disease. How is this diagnosed? This condition may be diagnosed based on your medical history, physical exam, and tests. During the physical exam, your health care provider will check for  swollen lymph nodes and an enlarged spleen. The spleen is an organ on the left side of your body that stores white blood cells. You may also have tests, such as: A complete blood count. This blood test counts each type of white cell. Bone marrow aspiration. Some bone marrow is removed to be checked under a microscope. Lymph node biopsy. Some lymph node tissue is removed to be checked under a microscope. Other types of blood tests or imaging tests. How is this treated? Treatment of leukopenia depends on the cause. Some common treatments include: Taking antibiotic medicine to treat bacterial infections. Stopping any medicines you are taking that may be causing leukopenia. Taking medicines to stimulate neutrophil production to treat neutropenia (hematopoietic growth factors). Follow these instructions at home: Infection prevention Avoid close contact with sick people. Wash your hands often with soap and water for at least 20 seconds. If soap and water are not available, use hand sanitizer. Do not eat uncooked or undercooked meats. Wash fruits and vegetables before eating them. Do not eat or drink unpasteurized  dairy products. Get regular dental care, and maintain good dental hygiene. You should visit the dentist at least once every 6 months. Avoid fresh flowers or plants, which may have bacteria in the soil. General instructions Take over-the-counter and prescription medicines only as told by your health care provider. This includes supplements and vitamins. If you were prescribed an antibiotic medicine, take it as told by your health care provider. Do not stop taking the antibiotic even if you start to feel better. Avoid crowds. If you need to go to a public place where many people gather, try to visit at a time when it is likely to be less crowded. Ask your health care provider about getting vaccinations, such as the influenza or pneumococcal vaccine. Keep all follow-up visits. This is important. Where to find more information American Cancer Society: www.cancer.org Centers for Disease Control and Prevention: [employer] Contact a health care provider if you have: A temperature greater than 101F (38.3C), sweating, or chills. Swollen lymph nodes. Fatigue. Painful mouth sores, or gum disease. Cough, stuffy nose, or sore throat. Pain or burning when you urinate, a strong need to urinate, or the need to urinate more often. Get help right away if you have: A temperature greater than 101F (38.3C) that lasts for more than 2 days. Symptoms such as cough, nasal congestion, sore throat, or urinary trouble that last for more than 2 days. Trouble breathing. Chest pain. These symptoms may be an emergency. Get help right away. Call 911. Do not wait to see if the symptoms will go away. Do not drive yourself to the hospital. Summary Leukopenia is a condition in which the body has a low number of white blood cells. White blood cells help the body to fight infections. Treatment of leukopenia depends on the cause. Preventing infection is important if you have leukopenia. Follow instructions for preventing  infection. Get help right away if you have a temperature greater than 101F (38.3C) that lasts for more than 2 days. This information is not intended to replace advice given to you by your health care provider. Make sure you discuss any questions you have with your health care provider. Document Revised: 01/28/2021 Document Reviewed: 01/28/2021 Elsevier Patient Education  2023 [employer].

## 2021-12-20 ENCOUNTER — Encounter: Payer: Self-pay | Admitting: Physician Assistant

## 2021-12-20 DIAGNOSIS — I480 Paroxysmal atrial fibrillation: Secondary | ICD-10-CM

## 2021-12-20 LAB — CBC WITH DIFFERENTIAL/PLATELET
Absolute Monocytes: 576 cells/uL (ref 200–950)
Basophils Absolute: 29 cells/uL (ref 0–200)
Basophils Relative: 0.5 %
Eosinophils Absolute: 108 cells/uL (ref 15–500)
Eosinophils Relative: 1.9 %
HCT: 33.2 % — ABNORMAL LOW (ref 38.5–50.0)
Hemoglobin: 10.3 g/dL — ABNORMAL LOW (ref 13.2–17.1)
Lymphs Abs: 849 cells/uL — ABNORMAL LOW (ref 850–3900)
MCH: 25.6 pg — ABNORMAL LOW (ref 27.0–33.0)
MCHC: 31 g/dL — ABNORMAL LOW (ref 32.0–36.0)
MCV: 82.4 fL (ref 80.0–100.0)
MPV: 11.9 fL (ref 7.5–12.5)
Monocytes Relative: 10.1 %
Neutro Abs: 4138 cells/uL (ref 1500–7800)
Neutrophils Relative %: 72.6 %
Platelets: 129 10*3/uL — ABNORMAL LOW (ref 140–400)
RBC: 4.03 10*6/uL — ABNORMAL LOW (ref 4.20–5.80)
RDW: 13.7 % (ref 11.0–15.0)
Total Lymphocyte: 14.9 %
WBC: 5.7 10*3/uL (ref 3.8–10.8)

## 2021-12-20 LAB — BASIC METABOLIC PANEL WITH GFR
BUN: 21 mg/dL (ref 7–25)
CO2: 29 mmol/L (ref 20–32)
Calcium: 8.9 mg/dL (ref 8.6–10.3)
Chloride: 103 mmol/L (ref 98–110)
Creat: 1.04 mg/dL (ref 0.70–1.28)
Glucose, Bld: 86 mg/dL (ref 65–99)
Potassium: 3.9 mmol/L (ref 3.5–5.3)
Sodium: 141 mmol/L (ref 135–146)
eGFR: 77 mL/min/{1.73_m2} (ref >=60)

## 2021-12-20 LAB — PATHOLOGIST SMEAR REVIEW

## 2021-12-20 LAB — IRON,TIBC AND FERRITIN PANEL
%SAT: 5 % (calc) — ABNORMAL LOW (ref 20–48)
Ferritin: 10 ng/mL — ABNORMAL LOW (ref 24–380)
Iron: 22 ug/dL — ABNORMAL LOW (ref 50–180)
TIBC: 449 mcg/dL (calc) — ABNORMAL HIGH (ref 250–425)

## 2021-12-20 LAB — B12 AND FOLATE PANEL
Folate: 4.2 ng/mL — ABNORMAL LOW
Vitamin B-12: 243 pg/mL (ref 200–1100)

## 2021-12-21 ENCOUNTER — Other Ambulatory Visit: Payer: Self-pay | Admitting: Physician Assistant

## 2021-12-21 ENCOUNTER — Encounter: Payer: Self-pay | Admitting: Physician Assistant

## 2021-12-21 ENCOUNTER — Other Ambulatory Visit: Payer: Self-pay | Admitting: Neurology

## 2021-12-21 DIAGNOSIS — D696 Thrombocytopenia, unspecified: Secondary | ICD-10-CM

## 2021-12-21 DIAGNOSIS — D649 Anemia, unspecified: Secondary | ICD-10-CM

## 2021-12-21 DIAGNOSIS — E538 Deficiency of other specified B group vitamins: Secondary | ICD-10-CM | POA: Insufficient documentation

## 2021-12-21 DIAGNOSIS — M5136 Other intervertebral disc degeneration, lumbar region: Secondary | ICD-10-CM | POA: Insufficient documentation

## 2021-12-21 MED ORDER — OMEPRAZOLE 40 MG PO CPDR: 40.0000 mg | DELAYED_RELEASE_CAPSULE | Freq: Every day | ORAL | 0 refills | Status: DC

## 2021-12-21 MED ORDER — METOPROLOL TARTRATE 25 MG PO TABS: 25.0000 mg | ORAL_TABLET | Freq: Two times a day (BID) | ORAL | 1 refills | Status: DC

## 2021-12-21 MED ORDER — GABAPENTIN 100 MG PO CAPS: ORAL_CAPSULE | ORAL | 0 refills | Status: DC

## 2021-12-21 MED ORDER — PREDNISONE 20 MG PO TABS: ORAL_TABLET | ORAL | 0 refills | Status: DC

## 2021-12-21 MED ORDER — FERROUS SULFATE 325 (65 FE) MG PO TBEC: 325.0000 mg | DELAYED_RELEASE_TABLET | Freq: Every day | ORAL | 1 refills | Status: DC

## 2021-12-21 NOTE — Progress Notes (Signed)
As suspected no acute findings but lots of degenerative disc disease(arthritis). If back pain not improving we can talk more about this at future appt.

## 2021-12-21 NOTE — Progress Notes (Signed)
Joel Ford,   Kidney and glucose look great.  Electrolytes look great.   Folate is low. Start a day.  B12 normal but very low. Start of b12 daily.  WBC and platelets improved which is great news.  Hemoglobin worsened just a bit. Iron stores and serum iron are both really low.  You really need a colonoscopy to look for any blood loss. Where would you like for me to make this referral?  Start ferrous sulfate daily with breakfast. I sent to the pharmacy.  Watch out for constipation and treat accordingly.  Recheck CBC/folate in 1 month.

## 2022-01-12 ENCOUNTER — Other Ambulatory Visit: Payer: Self-pay | Admitting: Physician Assistant

## 2022-01-17 ENCOUNTER — Other Ambulatory Visit: Payer: Self-pay | Admitting: Cardiovascular Disease

## 2022-01-17 DIAGNOSIS — Z7901 Long term (current) use of anticoagulants: Secondary | ICD-10-CM

## 2022-01-17 NOTE — Telephone Encounter (Signed)
Prescription refill request for Xarelto received.  Indication: PAF Last office visit: 12/04/21  Terrilee Files MD Weight: 91.6kg Age: 71 Scr: 1.04 on 12/19/21 CrCl: 85.63  Based on above findings Xarelto 20mg  daily is the appropriate dose.  Refill approved.

## 2022-01-17 NOTE — Telephone Encounter (Signed)
Refill Request.  

## 2022-02-08 ENCOUNTER — Other Ambulatory Visit: Payer: Self-pay | Admitting: Physician Assistant

## 2022-02-22 ENCOUNTER — Other Ambulatory Visit: Payer: Self-pay | Admitting: Physician Assistant

## 2022-02-27 ENCOUNTER — Encounter: Payer: Self-pay | Admitting: General Practice

## 2022-06-11 ENCOUNTER — Other Ambulatory Visit: Payer: Medicare HMO

## 2022-06-13 ENCOUNTER — Other Ambulatory Visit: Payer: Self-pay | Admitting: Physician Assistant

## 2022-06-13 ENCOUNTER — Ambulatory Visit: Payer: Medicare HMO | Admitting: Cardiovascular Disease

## 2022-06-13 DIAGNOSIS — I480 Paroxysmal atrial fibrillation: Secondary | ICD-10-CM

## 2022-06-23 ENCOUNTER — Other Ambulatory Visit: Payer: Self-pay | Admitting: Physician Assistant

## 2022-07-02 ENCOUNTER — Telehealth: Payer: Self-pay | Admitting: Neurology

## 2022-07-02 NOTE — Telephone Encounter (Signed)
Patient's wife called and LVM stating patient having issues with hemorrhoids. They have tried Preparation H and warm baths, still one is bothering him. Asked for RX.   Patient needs appt for evaluation. Please call to schedule. [phone].

## 2022-07-11 ENCOUNTER — Ambulatory Visit: Payer: Medicare HMO

## 2022-07-15 ENCOUNTER — Telehealth: Payer: Self-pay | Admitting: Cardiovascular Disease

## 2022-07-15 NOTE — Telephone Encounter (Signed)
Fine with me

## 2022-07-15 NOTE — Telephone Encounter (Signed)
Patient requesting to switch from Dr. Fletcher Anon to Dr. Stanford Breed.

## 2022-07-16 ENCOUNTER — Telehealth: Payer: Self-pay | Admitting: Physician Assistant

## 2022-07-16 MED ORDER — LIDOCAINE 5 % EX OINT: 1.0000 | TOPICAL_OINTMENT | Freq: Three times a day (TID) | CUTANEOUS | 1 refills | Status: DC | PRN

## 2022-07-16 NOTE — Telephone Encounter (Signed)
Painful hemorrhoids.

## 2022-07-16 NOTE — Telephone Encounter (Signed)
Called patient to inform provider switch was approved. Unable to leave voicemail.

## 2022-07-18 DIAGNOSIS — M1991 Primary osteoarthritis, unspecified site: Secondary | ICD-10-CM | POA: Insufficient documentation

## 2022-07-19 ENCOUNTER — Encounter: Payer: Self-pay | Admitting: Physician Assistant

## 2022-07-19 ENCOUNTER — Other Ambulatory Visit: Payer: Self-pay | Admitting: Physician Assistant

## 2022-07-19 ENCOUNTER — Ambulatory Visit (INDEPENDENT_AMBULATORY_CARE_PROVIDER_SITE_OTHER): Payer: Medicare HMO | Admitting: Physician Assistant

## 2022-07-19 VITALS — BP 150/85 | HR 73 | Ht 70.0 in | Wt 192.0 lb

## 2022-07-19 DIAGNOSIS — K644 Residual hemorrhoidal skin tags: Secondary | ICD-10-CM | POA: Diagnosis not present

## 2022-07-19 DIAGNOSIS — K5901 Slow transit constipation: Secondary | ICD-10-CM | POA: Insufficient documentation

## 2022-07-19 DIAGNOSIS — Z1211 Encounter for screening for malignant neoplasm of colon: Secondary | ICD-10-CM | POA: Diagnosis not present

## 2022-07-19 DIAGNOSIS — K648 Other hemorrhoids: Secondary | ICD-10-CM | POA: Diagnosis not present

## 2022-07-19 DIAGNOSIS — K649 Unspecified hemorrhoids: Secondary | ICD-10-CM | POA: Insufficient documentation

## 2022-07-19 MED ORDER — HYDROCORTISONE ACETATE 25 MG RE SUPP: 25.0000 mg | Freq: Two times a day (BID) | RECTAL | 1 refills | Status: DC | PRN

## 2022-07-19 MED ORDER — HYDROCORTISONE (PERIANAL) 2.5 % EX CREA: 1.0000 | TOPICAL_CREAM | Freq: Two times a day (BID) | CUTANEOUS | 0 refills | Status: DC

## 2022-07-19 NOTE — Patient Instructions (Signed)
Continue on stool softener Start miralax in morning in 4oz of juice Continue sitz baths Use suppository with external cream and lidocaine as needed  Hemorrhoids Hemorrhoids are swollen veins that may develop: In the butt (rectum). These are called internal hemorrhoids. Around the opening of the butt (anus). These are called external hemorrhoids. Hemorrhoids can cause pain, itching, or bleeding. Most of the time, they do not cause serious problems. They usually get better with diet changes, lifestyle changes, and other home treatments. What are the causes? This condition may be caused by: Having trouble pooping (constipation). Pushing hard (straining) to poop. Watery poop (diarrhea). Pregnancy. Being very overweight (obese). Sitting for long periods of time. Heavy lifting or other activity that causes you to strain. Anal sex. Riding a bike for a long period of time. What are the signs or symptoms? Symptoms of this condition include: Pain. Itching or soreness in the butt. Bleeding from the butt. Leaking poop. Swelling in the area. One or more lumps around the opening of your butt. How is this diagnosed? A doctor can often diagnose this condition by looking at the affected area. The doctor may also: Do an exam that involves feeling the area with a gloved hand (digital rectal exam). Examine the area inside your butt using a small tube (anoscope). Order blood tests. This may be done if you have lost a lot of blood. Have you get a test that involves looking inside the colon using a flexible tube with a camera on the end (sigmoidoscopy or colonoscopy). How is this treated? This condition can usually be treated at home. Your doctor may tell you to change what you eat, make lifestyle changes, or try home treatments. If these do not help, procedures can be done to remove the hemorrhoids or make them smaller. These may involve: Placing rubber bands at the base of the hemorrhoids to cut off  their blood supply. Injecting medicine into the hemorrhoids to shrink them. Shining a type of light energy onto the hemorrhoids to cause them to fall off. Doing surgery to remove the hemorrhoids or cut off their blood supply. Follow these instructions at home: Eating and drinking  Eat foods that have a lot of fiber in them. These include whole grains, beans, nuts, fruits, and vegetables. Ask your doctor about taking products that have added fiber (fibersupplements). Reduce the amount of fat in your diet. You can do this by: Eating low-fat dairy products. Eating less red meat. Avoiding processed foods. Drink enough fluid to keep your pee (urine) pale yellow. Managing pain and swelling  Take a warm-water bath (sitz bath) for 20 minutes to ease pain. Do this 3-4 times a day. You may do this in a bathtub or using a portable sitz bath that fits over the toilet. If told, put ice on the painful area. It may be helpful to use ice between your warm baths. Put ice in a plastic bag. Place a towel between your skin and the bag. Leave the ice on for 20 minutes, 2-3 times a day. General instructions Take over-the-counter and prescription medicines only as told by your doctor. Medicated creams and medicines may be used as told. Exercise often. Ask your doctor how much and what kind of exercise is best for you. Go to the bathroom when you have the urge to poop. Do not wait. Avoid pushing too hard when you poop. Keep your butt dry and clean. Use wet toilet paper or moist towelettes after pooping. Do not sit on the  toilet for a long time. Keep all follow-up visits as told by your doctor. This is important. Contact a doctor if you: Have pain and swelling that do not get better with treatment or medicine. Have trouble pooping. Cannot poop. Have pain or swelling outside the area of the hemorrhoids. Get help right away if you have: Bleeding that will not stop. Summary Hemorrhoids are swollen veins in  the butt or around the opening of the butt. They can cause pain, itching, or bleeding. Eat foods that have a lot of fiber in them. These include whole grains, beans, nuts, fruits, and vegetables. Take a warm-water bath (sitz bath) for 20 minutes to ease pain. Do this 3-4 times a day. This information is not intended to replace advice given to you by your health care provider. Make sure you discuss any questions you have with your health care provider. Document Revised: 01/02/2021 Document Reviewed: 01/03/2021 Elsevier Patient Education  [place].

## 2022-07-19 NOTE — Progress Notes (Signed)
Acute Office Visit  Subjective:     Patient ID: Joel Ford, male    DOB: [birth year], 72 y.o.   MRN: [id number]  Chief Complaint  Patient presents with   Hemorrhoids    HPI Patient is in today for hemorrhoids and discomfort for the last 2 weeks. He has had hemorrhoids before but not this bad. He has tried OTC creams and sitz baths with some relief. Denies any rectal bleeding. He admits to hard stools and straining. He is taking stool softener with some benefit. No documented colonoscopy but reports having it done before but "been a while".   .. Active Ambulatory Problems    Diagnosis Date Noted   Hyperlipidemia 07/04/2009   GOUT 01/22/2008   EXTERNAL HEMORRHOIDS 12/26/2009   RENAL CALCULUS 02/15/2008   Thoracic or lumbosacral neuritis or radiculitis 02/15/2008   CHEST WALL PAIN, ACUTE 08/30/2008   HYPERGLYCEMIA 07/27/2009   ABNORMAL ELECTROCARDIOGRAM 07/04/2009   Arthritis of knee, degenerative 08/30/2012   Preop cardiovascular exam 09/06/2012   Gout    Left knee DJD    S/P total knee replacement 08/07/2009   PAF (paroxysmal atrial fibrillation) ([place]) 08/05/2016   New onset atrial fibrillation ([place]) 08/06/2016   B12 deficiency anemia 08/06/2016   Anemia, iron deficiency 08/06/2016   Hypothyroidism 08/06/2016   Health care maintenance 09/03/2016   Essential hypertension 03/03/2017   Tick bite 11/19/2018   Leukopenia 12/19/2021   Normocytic anemia 12/19/2021   Thrombocytopenia ([place]) 12/19/2021   Acute left-sided low back pain without sciatica 12/19/2021   Folate deficiency 12/21/2021   DDD (degenerative disc disease), lumbar 12/21/2021   Primary localized osteoarthrosis of multiple sites 07/18/2022   Hemorrhoids 07/19/2022   Colon cancer screening 07/19/2022   Slow transit constipation 07/19/2022   Internal hemorrhoids 07/19/2022   Resolved Ambulatory Problems    Diagnosis Date Noted   Cellulitis and abscess of upper arm and forearm 06/18/2007   HEEL PAIN,  RIGHT 11/10/2006   Past Medical History:  Diagnosis Date   Atrial fibrillation ([place])    HYPERLIPIDEMIA 07/04/2009   Thyroid disease      ROS  See HPI.     Objective:    BP (!) 150/85   Pulse 73   Ht 5\' 10"  (1.778 m)   Wt 192 lb (87.1 kg)   SpO2 100%   BMI 27.55 kg/m  BP Readings from Last 3 Encounters:  07/19/22 (!) 150/85  12/19/21 123/66  12/13/21 108/68   Wt Readings from Last 3 Encounters:  07/19/22 192 lb (87.1 kg)  12/19/21 197 lb (89.4 kg)  12/04/21 202 lb (91.6 kg)      Physical Exam Constitutional:      Appearance: Normal appearance.  Cardiovascular:     Rate and Rhythm: Normal rate.  Pulmonary:     Effort: Pulmonary effort is normal.  Genitourinary:    Comments: Non thrombosed hemorrhoids. No active bleeding.  Neurological:     General: No focal deficit present.     Mental Status: He is alert and oriented to person, place, and time.  Psychiatric:        Mood and Affect: Mood normal.           Assessment & Plan:  Marland KitchenMarland KitchenBhargav was seen today for hemorrhoids.  Diagnoses and all orders for this visit:  External hemorrhoids -     hydrocortisone (ANUSOL-HC) 25 MG suppository; Place 1 suppository (25 mg total) rectally 2 (two) times daily as needed for hemorrhoids. -     hydrocortisone (  ANUSOL-HC) 2.5 % rectal cream; Place 1 Application rectally 2 (two) times daily. -     Ambulatory referral to Gastroenterology  Colon cancer screening -     Ambulatory referral to Gastroenterology  Internal hemorrhoids -     hydrocortisone (ANUSOL-HC) 25 MG suppository; Place 1 suppository (25 mg total) rectally 2 (two) times daily as needed for hemorrhoids. -     hydrocortisone (ANUSOL-HC) 2.5 % rectal cream; Place 1 Application rectally 2 (two) times daily.  Slow transit constipation   No thrombosed hemorrhoids. Use rectal cream and suppositories. Continue sitz baths. Continue stool softener and add miralax.  Referral for colonoscopy.    Iran Planas,  PA-C

## 2022-07-30 ENCOUNTER — Other Ambulatory Visit: Payer: Self-pay | Admitting: Cardiovascular Disease

## 2022-07-30 DIAGNOSIS — Z7901 Long term (current) use of anticoagulants: Secondary | ICD-10-CM

## 2022-07-30 NOTE — Telephone Encounter (Signed)
Pt last saw Dr Fletcher Anon 12/04/21 for PAF, last labs 12/19/21 Creat 1.04, age 72, weight 87.1kg, CrCl 80.26, based on CrCl pt is on appropriate dosage of Xarelto 20mg  QD for PAF.  Will refill rx.

## 2022-07-30 NOTE — Telephone Encounter (Signed)
Please review

## 2022-08-06 ENCOUNTER — Telehealth: Payer: Self-pay

## 2022-08-06 NOTE — Telephone Encounter (Signed)
Initiated Prior authorization [id number] 5% ointment Via: Covermymeds Case/Key:[id number] Status: approved  as of 08/06/22 Reason:Authorization Expiration Date: 10/29/2022 Notified Pt via: Mychart

## 2022-08-09 ENCOUNTER — Ambulatory Visit: Payer: Medicare HMO | Admitting: Cardiovascular Disease

## 2022-09-02 ENCOUNTER — Telehealth: Payer: Self-pay | Admitting: Physician Assistant

## 2022-09-02 NOTE — Telephone Encounter (Signed)
Called patient to schedule Medicare Annual Wellness Visit (AWV). No voicemail available to leave a message.  Last date of AWV: Never  Please schedule an appointment at any time with Nurse Health Advisor.  If any questions, please contact me at [phone].  Thank you ,  Lin Givens Patient Access Advocate II Direct Dial: [phone]

## 2022-09-05 ENCOUNTER — Other Ambulatory Visit: Payer: Self-pay | Admitting: Physician Assistant

## 2022-09-05 DIAGNOSIS — I480 Paroxysmal atrial fibrillation: Secondary | ICD-10-CM

## 2022-09-30 ENCOUNTER — Telehealth: Payer: Self-pay | Admitting: Cardiology

## 2022-09-30 NOTE — Telephone Encounter (Signed)
Pt c/o of Chest Pain: STAT if CP now or developed within 24 hours  1. Are you having CP right now? No, patient is asleep  2. Are you experiencing any other symptoms (ex. SOB, nausea, vomiting, sweating)? SOB  3. How long have you been experiencing CP? A couple days  4. Is your CP continuous or coming and going? Comes and goes  5. Have you taken Nitroglycerin? no  Patient's wife states the patient has been having chest tightness. She says he has had issues with hemorrhoids and not being bale to go to the bathroom. ?

## 2022-09-30 NOTE — Telephone Encounter (Signed)
Spoke to patient he stated he has been having sob and chest tightness off and on for the past 3 days.No sob or chest tightness at present.Appointment scheduled with Joel Montana NP 3/39 at 2:45 pm at our [place] office.Directions given.Advised if he worsens he needs to go to ED.

## 2022-10-01 ENCOUNTER — Encounter: Payer: Self-pay | Admitting: Physician Assistant

## 2022-10-01 ENCOUNTER — Ambulatory Visit (INDEPENDENT_AMBULATORY_CARE_PROVIDER_SITE_OTHER): Payer: Medicare HMO | Admitting: Physician Assistant

## 2022-10-01 VITALS — BP 114/51 | HR 90 | Ht 70.0 in | Wt 188.0 lb

## 2022-10-01 DIAGNOSIS — K429 Umbilical hernia without obstruction or gangrene: Secondary | ICD-10-CM | POA: Insufficient documentation

## 2022-10-01 DIAGNOSIS — Z1211 Encounter for screening for malignant neoplasm of colon: Secondary | ICD-10-CM | POA: Diagnosis not present

## 2022-10-01 DIAGNOSIS — D519 Vitamin B12 deficiency anemia, unspecified: Secondary | ICD-10-CM

## 2022-10-01 DIAGNOSIS — D649 Anemia, unspecified: Secondary | ICD-10-CM

## 2022-10-01 DIAGNOSIS — K5900 Constipation, unspecified: Secondary | ICD-10-CM

## 2022-10-01 DIAGNOSIS — F5101 Primary insomnia: Secondary | ICD-10-CM | POA: Diagnosis not present

## 2022-10-01 DIAGNOSIS — R69 Illness, unspecified: Secondary | ICD-10-CM | POA: Diagnosis not present

## 2022-10-01 MED ORDER — LINACLOTIDE 145 MCG PO CAPS: 145.0000 ug | ORAL_CAPSULE | Freq: Every day | ORAL | 2 refills | Status: DC

## 2022-10-01 MED ORDER — TRAZODONE HCL 50 MG PO TABS: 25.0000 mg | ORAL_TABLET | Freq: Every evening | ORAL | 3 refills | Status: DC | PRN

## 2022-10-01 NOTE — Patient Instructions (Signed)
Start linzess daily for constipation Will make GI referral Get labs today Stop oral iron.   Trial of trazodone for sleep.

## 2022-10-01 NOTE — Progress Notes (Signed)
Established Patient Office Visit  Subjective   Patient ID: Joel Ford, male    DOB: [birth year]  Age: 72 y.o. MRN: [id number]  Chief Complaint  Patient presents with   Follow-up   Constipation    HPI Pt is a 72 yo male who presents to the clinic to discuss constipation. He has struggled with constipation since beginning of the year. He is having one small hard bowel movement every 6 days or so. He denies any melena or hematochezia. He has never had colonoscopy. He does have history of anemia and on iron supplements. His abdomen can feel "tight" at times but no pain. Tried mucinex and OTC medications for constipation but not helpful.   Pt has struggled with sleep for years. He has trouble going and staying asleep. He would like to try something.  .. Active Ambulatory Problems    Diagnosis Date Noted   Hyperlipidemia 07/04/2009   GOUT 01/22/2008   EXTERNAL HEMORRHOIDS 12/26/2009   RENAL CALCULUS 02/15/2008   Thoracic or lumbosacral neuritis or radiculitis 02/15/2008   CHEST WALL PAIN, ACUTE 08/30/2008   HYPERGLYCEMIA 07/27/2009   ABNORMAL ELECTROCARDIOGRAM 07/04/2009   Arthritis of knee, degenerative 08/30/2012   Preop cardiovascular exam 09/06/2012   Gout    Left knee DJD    S/P total knee replacement 08/07/2009   PAF (paroxysmal atrial fibrillation) ([place]) 08/05/2016   New onset atrial fibrillation ([place]) 08/06/2016   B12 deficiency anemia 08/06/2016   Anemia, iron deficiency 08/06/2016   Hypothyroidism 08/06/2016   Health care maintenance 09/03/2016   Essential hypertension 03/03/2017   Tick bite 11/19/2018   Leukopenia 12/19/2021   Normocytic anemia 12/19/2021   Thrombocytopenia ([place]) 12/19/2021   Acute left-sided low back pain without sciatica 12/19/2021   Folate deficiency 12/21/2021   DDD (degenerative disc disease), lumbar 12/21/2021   Primary localized osteoarthrosis of multiple sites 07/18/2022   Hemorrhoids 07/19/2022   Colon cancer screening 07/19/2022    Slow transit constipation 07/19/2022   Internal hemorrhoids 07/19/2022   Constipation [id number]   Umbilical hernia without obstruction and without gangrene 10/01/2022   Resolved Ambulatory Problems    Diagnosis Date Noted   Cellulitis and abscess of upper arm and forearm 06/18/2007   HEEL PAIN, RIGHT 11/10/2006   Past Medical History:  Diagnosis Date   Atrial fibrillation ([place])    HYPERLIPIDEMIA 07/04/2009   Thyroid disease      ROS See HPI.    Objective:     BP (!) 114/51   Pulse 90   Ht 5\' 10"  (1.778 m)   Wt 188 lb (85.3 kg)   SpO2 100%   BMI 26.98 kg/m  BP Readings from Last 3 Encounters:  10/01/22 (!) 114/51  07/19/22 (!) 150/85  12/19/21 123/66   Wt Readings from Last 3 Encounters:  10/01/22 188 lb (85.3 kg)  07/19/22 192 lb (87.1 kg)  12/19/21 197 lb (89.4 kg)      Physical Exam Constitutional:      Appearance: Normal appearance.  HENT:     Head: Normocephalic.  Cardiovascular:     Rate and Rhythm: Normal rate and regular rhythm.     Heart sounds: Murmur heard.  Pulmonary:     Effort: Pulmonary effort is normal.  Abdominal:     General: There is distension.     Palpations: Abdomen is soft. There is no mass.     Tenderness: There is no abdominal tenderness. There is no right CVA tenderness, left CVA tenderness, guarding or rebound.  Hernia: A hernia is present.     Comments: Decreased bowel sounds Reducible umbilical hernia  Musculoskeletal:     Right lower leg: No edema.     Left lower leg: No edema.  Neurological:     Mental Status: He is alert.  Psychiatric:        Mood and Affect: Mood normal.         Assessment & Plan:  Marland KitchenMarland KitchenKadien was seen today for follow-up and constipation.  Diagnoses and all orders for this visit:  Constipation, unspecified constipation type -     CBC w/Diff/Platelet -     COMPLETE METABOLIC PANEL WITH GFR -     TSH -     linaclotide (LINZESS) 145 MCG CAPS capsule; Take 1 capsule (145 mcg total) by mouth  daily. -     Ambulatory referral to Gastroenterology  Anemia due to vitamin B12 deficiency, unspecified B12 deficiency type -     CBC w/Diff/Platelet -     Fe+TIBC+Fer -     B12 and Folate Panel  Umbilical hernia without obstruction and without gangrene  Primary insomnia -     traZODone (DESYREL) 50 MG tablet; Take 0.5-1 tablets (25-50 mg total) by mouth at bedtime as needed for sleep.  Colon cancer screening -     Ambulatory referral to Gastroenterology   Linzess given for constipation to take daily Discussed side effects and to give it at least 2 weeks STOP all oral iron supplements Will recheck b12 and hemoglobin Make sure drinking enough water throughout the day Pt needs colonoscopy, referral to GI made.  Watch umbilical hernia and if becomes painful or not reducible needs to reach out to clinic  Trial of trazodone for sleep  Return in about 3 months (around 01/01/2023).    Iran Planas, PA-C

## 2022-10-02 ENCOUNTER — Observation Stay (HOSPITAL_COMMUNITY)
Admission: EM | Admit: 2022-10-02 | Discharge: 2022-10-04 | Disposition: A | Discharge status: Home / Self Care | Payer: Medicare HMO | Attending: Family Medicine | Admitting: Family Medicine

## 2022-10-02 ENCOUNTER — Encounter (HOSPITAL_COMMUNITY): Payer: Self-pay

## 2022-10-02 ENCOUNTER — Other Ambulatory Visit: Payer: Self-pay

## 2022-10-02 ENCOUNTER — Emergency Department (HOSPITAL_COMMUNITY): Payer: Medicare HMO

## 2022-10-02 DIAGNOSIS — I480 Paroxysmal atrial fibrillation: Secondary | ICD-10-CM | POA: Diagnosis present

## 2022-10-02 DIAGNOSIS — I8501 Esophageal varices with bleeding: Secondary | ICD-10-CM | POA: Insufficient documentation

## 2022-10-02 DIAGNOSIS — M109 Gout, unspecified: Secondary | ICD-10-CM | POA: Diagnosis present

## 2022-10-02 DIAGNOSIS — D696 Thrombocytopenia, unspecified: Secondary | ICD-10-CM | POA: Diagnosis not present

## 2022-10-02 DIAGNOSIS — K317 Polyp of stomach and duodenum: Secondary | ICD-10-CM | POA: Diagnosis not present

## 2022-10-02 DIAGNOSIS — K5730 Diverticulosis of large intestine without perforation or abscess without bleeding: Secondary | ICD-10-CM | POA: Insufficient documentation

## 2022-10-02 DIAGNOSIS — D649 Anemia, unspecified: Secondary | ICD-10-CM | POA: Insufficient documentation

## 2022-10-02 DIAGNOSIS — K621 Rectal polyp: Secondary | ICD-10-CM | POA: Insufficient documentation

## 2022-10-02 DIAGNOSIS — K766 Portal hypertension: Secondary | ICD-10-CM | POA: Diagnosis not present

## 2022-10-02 DIAGNOSIS — I4891 Unspecified atrial fibrillation: Secondary | ICD-10-CM | POA: Diagnosis not present

## 2022-10-02 DIAGNOSIS — Z7901 Long term (current) use of anticoagulants: Secondary | ICD-10-CM | POA: Insufficient documentation

## 2022-10-02 DIAGNOSIS — D509 Iron deficiency anemia, unspecified: Secondary | ICD-10-CM | POA: Insufficient documentation

## 2022-10-02 DIAGNOSIS — R0602 Shortness of breath: Secondary | ICD-10-CM | POA: Diagnosis not present

## 2022-10-02 DIAGNOSIS — K3189 Other diseases of stomach and duodenum: Secondary | ICD-10-CM | POA: Diagnosis not present

## 2022-10-02 DIAGNOSIS — K922 Gastrointestinal hemorrhage, unspecified: Secondary | ICD-10-CM | POA: Diagnosis present

## 2022-10-02 DIAGNOSIS — R791 Abnormal coagulation profile: Secondary | ICD-10-CM | POA: Insufficient documentation

## 2022-10-02 DIAGNOSIS — D519 Vitamin B12 deficiency anemia, unspecified: Secondary | ICD-10-CM | POA: Diagnosis not present

## 2022-10-02 DIAGNOSIS — Z79899 Other long term (current) drug therapy: Secondary | ICD-10-CM | POA: Diagnosis not present

## 2022-10-02 DIAGNOSIS — Z96653 Presence of artificial knee joint, bilateral: Secondary | ICD-10-CM | POA: Insufficient documentation

## 2022-10-02 DIAGNOSIS — E039 Hypothyroidism, unspecified: Secondary | ICD-10-CM | POA: Diagnosis not present

## 2022-10-02 DIAGNOSIS — K5900 Constipation, unspecified: Secondary | ICD-10-CM | POA: Diagnosis not present

## 2022-10-02 HISTORY — DX: Polyp of stomach and duodenum: K31.7

## 2022-10-02 HISTORY — DX: Esophageal varices without bleeding: I85.00

## 2022-10-02 HISTORY — DX: Other diseases of stomach and duodenum: K31.89

## 2022-10-02 LAB — COMPLETE METABOLIC PANEL WITH GFR
AG Ratio: 2.1 (calc) (ref 1.0–2.5)
ALT: 7 U/L — ABNORMAL LOW (ref 9–46)
AST: 12 U/L (ref 10–35)
Albumin: 4.1 g/dL (ref 3.6–5.1)
Alkaline phosphatase (APISO): 71 U/L (ref 35–144)
BUN: 18 mg/dL (ref 7–25)
CO2: 25 mmol/L (ref 20–32)
Calcium: 8.5 mg/dL — ABNORMAL LOW (ref 8.6–10.3)
Chloride: 107 mmol/L (ref 98–110)
Creat: 0.96 mg/dL (ref 0.70–1.28)
Globulin: 2 g/dL (calc) (ref 1.9–3.7)
Glucose, Bld: 219 mg/dL — ABNORMAL HIGH (ref 65–99)
Potassium: 3.9 mmol/L (ref 3.5–5.3)
Sodium: 141 mmol/L (ref 135–146)
Total Bilirubin: 0.7 mg/dL (ref 0.2–1.2)
Total Protein: 6.1 g/dL (ref 6.1–8.1)
eGFR: 85 mL/min/{1.73_m2} (ref >=60)

## 2022-10-02 LAB — CBC
HCT: 21.6 % — ABNORMAL LOW (ref 39.0–52.0)
HCT: 23.2 % — ABNORMAL LOW (ref 39.0–52.0)
Hemoglobin: 5.6 g/dL — CL (ref 13.0–17.0)
Hemoglobin: 6.6 g/dL — CL (ref 13.0–17.0)
MCH: 19.9 pg — ABNORMAL LOW (ref 26.0–34.0)
MCH: 22.1 pg — ABNORMAL LOW (ref 26.0–34.0)
MCHC: 25.9 g/dL — ABNORMAL LOW (ref 30.0–36.0)
MCHC: 28.4 g/dL — ABNORMAL LOW (ref 30.0–36.0)
MCV: 76.9 fL — ABNORMAL LOW (ref 80.0–100.0)
MCV: 77.6 fL — ABNORMAL LOW (ref 80.0–100.0)
Platelets: 113 10*3/uL — ABNORMAL LOW (ref 150–400)
Platelets: 91 10*3/uL — ABNORMAL LOW (ref 150–400)
RBC: 2.81 MIL/uL — ABNORMAL LOW (ref 4.22–5.81)
RBC: 2.99 MIL/uL — ABNORMAL LOW (ref 4.22–5.81)
RDW: 16.7 % — ABNORMAL HIGH (ref 11.5–15.5)
RDW: 16.5 % — ABNORMAL HIGH (ref 11.5–15.5)
WBC: 4 10*3/uL (ref 4.0–10.5)
WBC: 2.9 10*3/uL — ABNORMAL LOW (ref 4.0–10.5)
nRBC: 0 % (ref 0.0–0.2)
nRBC: 0 % (ref 0.0–0.2)

## 2022-10-02 LAB — PREPARE RBC (CROSSMATCH)

## 2022-10-02 LAB — IRON,TIBC AND FERRITIN PANEL
%SAT: 3 % (calc) — ABNORMAL LOW (ref 20–48)
Ferritin: 5 ng/mL — ABNORMAL LOW (ref 24–380)
Iron: 11 ug/dL — ABNORMAL LOW (ref 50–180)
TIBC: 435 mcg/dL (calc) — ABNORMAL HIGH (ref 250–425)

## 2022-10-02 LAB — COMPREHENSIVE METABOLIC PANEL
ALT: 11 U/L (ref 0–44)
AST: 15 U/L (ref 15–41)
Albumin: 3.6 g/dL (ref 3.5–5.0)
Alkaline Phosphatase: 66 U/L (ref 38–126)
Anion gap: 7 (ref 5–15)
BUN: 20 mg/dL (ref 8–23)
CO2: 24 mmol/L (ref 22–32)
Calcium: 8.2 mg/dL — ABNORMAL LOW (ref 8.9–10.3)
Chloride: 108 mmol/L (ref 98–111)
Creatinine, Ser: 0.77 mg/dL (ref 0.61–1.24)
GFR, Estimated: >60 mL/min (ref >60)
Glucose, Bld: 140 mg/dL — ABNORMAL HIGH (ref 70–99)
Potassium: 3.8 mmol/L (ref 3.5–5.1)
Sodium: 139 mmol/L (ref 135–145)
Total Bilirubin: 0.7 mg/dL (ref 0.3–1.2)
Total Protein: 6 g/dL — ABNORMAL LOW (ref 6.5–8.1)

## 2022-10-02 LAB — B12 AND FOLATE PANEL
Folate: 10.4 ng/mL
Vitamin B-12: 198 pg/mL — ABNORMAL LOW (ref 200–1100)

## 2022-10-02 LAB — PROTIME-INR
INR: 3.1 — ABNORMAL HIGH (ref 0.8–1.2)
Prothrombin Time: 31.6 seconds — ABNORMAL HIGH (ref 11.4–15.2)

## 2022-10-02 LAB — CBC WITH DIFFERENTIAL/PLATELET
Absolute Monocytes: 391 cells/uL (ref 200–950)
Basophils Absolute: 8 cells/uL (ref 0–200)
Basophils Relative: 0.2 %
Eosinophils Absolute: 122 cells/uL (ref 15–500)
Eosinophils Relative: 2.9 %
HCT: 21.9 % — ABNORMAL LOW (ref 38.5–50.0)
Hemoglobin: 6 g/dL — CL (ref 13.2–17.1)
Lymphs Abs: 529 cells/uL — ABNORMAL LOW (ref 850–3900)
MCH: 20.2 pg — ABNORMAL LOW (ref 27.0–33.0)
MCHC: 27.4 g/dL — ABNORMAL LOW (ref 32.0–36.0)
MCV: 73.7 fL — ABNORMAL LOW (ref 80.0–100.0)
MPV: 11.5 fL (ref 7.5–12.5)
Monocytes Relative: 9.3 %
Neutro Abs: 3150 cells/uL (ref 1500–7800)
Neutrophils Relative %: 75 %
Platelets: 128 10*3/uL — ABNORMAL LOW (ref 140–400)
RBC: 2.97 10*6/uL — ABNORMAL LOW (ref 4.20–5.80)
RDW: 15.9 % — ABNORMAL HIGH (ref 11.0–15.0)
Total Lymphocyte: 12.6 %
WBC: 4.2 10*3/uL (ref 3.8–10.8)

## 2022-10-02 LAB — TSH: TSH: 11.73 mIU/L — ABNORMAL HIGH (ref 0.40–4.50)

## 2022-10-02 LAB — CBC MORPHOLOGY

## 2022-10-02 LAB — POC OCCULT BLOOD, ED: Fecal Occult Blood: POSITIVE — AB

## 2022-10-02 MED ORDER — PEG 3350-KCL-NA BICARB-NACL 420 G PO SOLR
4000.0000 mL | Freq: Once | ORAL | Status: AC
  Administered 2022-10-02: 4000 mL via ORAL

## 2022-10-02 MED ORDER — ACETAMINOPHEN 650 MG RE SUPP: 650.0000 mg | Freq: Four times a day (QID) | RECTAL | Status: DC | PRN

## 2022-10-02 MED ORDER — PANTOPRAZOLE 80MG IVPB - SIMPLE MED
80.0000 mg | Freq: Once | INTRAVENOUS | Status: AC
  Administered 2022-10-02: 80 mg via INTRAVENOUS
  Filled 2022-10-02: qty 80
  Filled 2022-10-02: qty 100

## 2022-10-02 MED ORDER — ONDANSETRON HCL 4 MG PO TABS: 4.0000 mg | ORAL_TABLET | Freq: Four times a day (QID) | ORAL | Status: DC | PRN

## 2022-10-02 MED ORDER — SODIUM CHLORIDE 0.9% IV SOLUTION: Freq: Once | INTRAVENOUS | Status: AC

## 2022-10-02 MED ORDER — METOPROLOL TARTRATE 25 MG PO TABS
25.0000 mg | ORAL_TABLET | Freq: Two times a day (BID) | ORAL | Status: DC
  Administered 2022-10-02 – 2022-10-04 (×3): 25 mg via ORAL
  Filled 2022-10-02 (×4): qty 1

## 2022-10-02 MED ORDER — PANTOPRAZOLE INFUSION (NEW) - SIMPLE MED
8.0000 mg/h | INTRAVENOUS | Status: DC
  Administered 2022-10-02 – 2022-10-04 (×5): 8 mg/h via INTRAVENOUS
  Filled 2022-10-02 (×4): qty 100
  Filled 2022-10-02 (×3): qty 80
  Filled 2022-10-02: qty 100

## 2022-10-02 MED ORDER — ONDANSETRON HCL 4 MG/2ML IJ SOLN: 4.0000 mg | Freq: Four times a day (QID) | INTRAMUSCULAR | Status: DC | PRN

## 2022-10-02 MED ORDER — ALLOPURINOL 300 MG PO TABS: 300.0000 mg | ORAL_TABLET | Freq: Every day | ORAL | Status: DC

## 2022-10-02 MED ORDER — ACETAMINOPHEN 325 MG PO TABS: 650.0000 mg | ORAL_TABLET | Freq: Four times a day (QID) | ORAL | Status: DC | PRN

## 2022-10-02 MED ORDER — SENNA 8.6 MG PO TABS
1.0000 | ORAL_TABLET | Freq: Two times a day (BID) | ORAL | Status: DC
  Administered 2022-10-02 – 2022-10-04 (×3): 8.6 mg via ORAL
  Filled 2022-10-02 (×4): qty 1

## 2022-10-02 MED ORDER — SODIUM CHLORIDE 0.9 % IV SOLN: INTRAVENOUS | Status: DC

## 2022-10-02 MED ORDER — CYANOCOBALAMIN 1000 MCG/ML IJ SOLN
1000.0000 ug | Freq: Every day | INTRAMUSCULAR | Status: DC
  Administered 2022-10-02 – 2022-10-04 (×2): 1000 ug via INTRAMUSCULAR
  Filled 2022-10-02 (×3): qty 1

## 2022-10-02 MED ORDER — POLYETHYLENE GLYCOL 3350 17 G PO PACK
17.0000 g | PACK | Freq: Two times a day (BID) | ORAL | Status: DC
  Administered 2022-10-02 – 2022-10-04 (×3): 17 g via ORAL
  Filled 2022-10-02 (×3): qty 1

## 2022-10-02 MED ORDER — SODIUM CHLORIDE 0.9% FLUSH
3.0000 mL | Freq: Two times a day (BID) | INTRAVENOUS | Status: DC
  Administered 2022-10-02 (×2): 3 mL via INTRAVENOUS

## 2022-10-02 NOTE — ED Triage Notes (Signed)
Sent by pcp for hemoglobin of 6.  Pt reports went to pcp yesterday for constipation and hemmrhiods x2 months.  reports sob and weakness x3 days.  Pt is on xarelto for A-fib.  Pt denies rectal bleeding, melena, hematochezia.  Hx of anemia but denies hx of blood transfusion  Denies cp, headache, dizziness

## 2022-10-02 NOTE — H&P (Signed)
History and Physical    Patient: Joel Ford [id number] DOB: 10-May-1951 DOA: 10/02/2022 DOS: the patient was seen and examined on 10/02/2022 PCP: Donella Stade, PA-C  Patient coming from: Home  Chief Complaint:  Chief Complaint  Patient presents with   Shortness of Breath   Weakness   HPI:  72 year old male PMH including atrial fibrillation on rivaroxaban, constipation, hemorrhoids, seen by PCP yesterday 3/26 for constipation and shortness of breath hemoglobin was 6 and so patient was sent to the emergency department.  Presented 3/27, hemoglobin confirmed at 5.6 and referred for admission for further evaluation of suspected GI bleed.  Patient has had hemorrhoids for a long time which cause pain but has not observed bleeding.  Has been taking iron.  Lately has become short of breath with exertion and lightheaded.  No other specific aggravating or alleviating factors.  No chest pain.  No other reported associated symptoms.  History of colonoscopy in the past sometime ago.   Review of Systems: Negative for fever, visual changes, sore throat, rash, new muscle aches, chest pain, dysuria , Nausea, vomiting, abdominal pain  Past Medical History:  Diagnosis Date   ABNORMAL ELECTROCARDIOGRAM 07/04/2009   Qualifier: Diagnosis of  By: Maxie Better FNP, Rosalita Levan    Atrial fibrillation ([place])    Gout    HYPERGLYCEMIA 07/27/2009   Qualifier: Diagnosis of  By: Council Mechanic MD, Hilaria Ota    HYPERLIPIDEMIA 07/04/2009   Qualifier: Diagnosis of  By: Maxie Better FNP, [name]    Left knee DJD    OA   RENAL CALCULUS 02/15/2008   Qualifier: Diagnosis of  By: Maxie Better FNP, Rosalita Levan    S/P total knee replacement 08/07/2009   right   Thyroid disease    Past Surgical History:  Procedure Laterality Date   APPENDECTOMY     age 27   CARDIAC CATHETERIZATION  01/23/2005   Normal   FINGER SURGERY     RIGHT INDEX, REATTACHED   orthoscopy     Right and left knee   TOTAL KNEE  ARTHROPLASTY  2011   Right   TOTAL KNEE ARTHROPLASTY Left 09/21/2012   Dr Noemi Chapel   TOTAL KNEE ARTHROPLASTY Left 09/21/2012   Procedure: TOTAL KNEE ARTHROPLASTY;  Surgeon: Lorn Junes, MD;  Location: [facility];  Service: Orthopedics;  Laterality: Left;   Social History:  reports that he has never smoked. His smokeless tobacco use includes chew. He reports that he does not drink alcohol and does not use drugs.  No Known Allergies  Family History  Problem Relation Age of Onset   Stroke Father    Healthy Mother    Healthy Brother    Healthy Son    Colon cancer Neg Hx    Prostate cancer Neg Hx     Prior to Admission medications   Medication Sig Start Date End Date Taking? Authorizing Provider  allopurinol (ZYLOPRIM) 300 MG tablet Take 300 mg by mouth daily. 01/27/19   [provider]  colchicine 0.6 MG tablet Take 0.6 mg by mouth daily.    [provider]  ferrous sulfate 325 (65 FE) MG EC tablet TAKE 1 TABLET (325 MG TOTAL) BY MOUTH DAILY WITH BREAKFAST. NEED LABS 07/19/22   Iran Planas L, PA-C  gabapentin (NEURONTIN) 100 MG capsule Take one tablet daily for 3 days and then increase as needed up to three times a day. 12/21/21   Breeback, Royetta Car, PA-C  hydrocortisone (ANUSOL-HC) 2.5 % rectal cream Place 1 Application rectally 2 (  two) times daily. 07/19/22   Breeback, Royetta Car, PA-C  hydrocortisone (ANUSOL-HC) 25 MG suppository Place 1 suppository (25 mg total) rectally 2 (two) times daily as needed for hemorrhoids. 07/19/22   Breeback, Jade L, PA-C  lidocaine (XYLOCAINE) 5 % ointment Apply 1 Application topically 3 (three) times daily as needed. 07/16/22   Breeback, Royetta Car, PA-C  linaclotide (LINZESS) 145 MCG CAPS capsule Take 1 capsule (145 mcg total) by mouth daily. 10/01/22   Breeback, Luvenia Starch L, PA-C  metoprolol tartrate (LOPRESSOR) 25 MG tablet Take 1 tablet (25 mg total) by mouth 2 (two) times daily. 09/05/22   Breeback, Royetta Car, PA-C  omeprazole (PRILOSEC) 40 MG capsule Take 1  capsule (40 mg total) by mouth daily. Needs appt 02/08/22   Donella Stade, PA-C  rivaroxaban (XARELTO) 20 MG TABS tablet TAKE 1 TABLET BY MOUTH DAILY WITH SUPPER 07/30/22   Wellington Hampshire, MD  tiZANidine (ZANAFLEX) 4 MG tablet Take 0.5 tablets (2 mg total) by mouth every 8 (eight) hours as needed for muscle spasms. 12/13/21   Teodora Medici, FNP  traZODone (DESYREL) 50 MG tablet Take 0.5-1 tablets (25-50 mg total) by mouth at bedtime as needed for sleep. 10/01/22   Donella Stade, PA-C    Physical Exam: Vitals:   10/02/22 0830 10/02/22 0900 10/02/22 0930 10/02/22 1100  BP: 118/65 110/66 106/61 113/65  Pulse: 83 76 78 77  Resp: 18 12 15 14   Temp:      TempSrc:      SpO2: 100% 100% 100% 100%  Weight:      Height:       Physical Exam Vitals reviewed.  Constitutional:      General: He is not in acute distress.    Appearance: He is not ill-appearing or toxic-appearing.  Cardiovascular:     Rate and Rhythm: Normal rate and regular rhythm.     Heart sounds: No murmur heard. Pulmonary:     Effort: Pulmonary effort is normal. No respiratory distress.     Breath sounds: No wheezing, rhonchi or rales.  Abdominal:     General: There is no distension.     Palpations: Abdomen is soft.     Tenderness: There is no abdominal tenderness.  Musculoskeletal:     Right lower leg: No edema.     Left lower leg: No edema.  Neurological:     Mental Status: He is alert.  Psychiatric:        Mood and Affect: Mood normal.        Behavior: Behavior normal.    Data Reviewed: Orthostatics negative AFVSS CMP unremarkable Hgb 5.6, MCV 76.9 CXR independent review, no acute disease EKG independent review SR, anterolateral ST depression, NSCSLT 12/04/2021  Assessment and Plan: Severe symptomatic microcytic anemia, suspect acute on chronic GI blood loss, GI bleed Microcytic anemia, fecal occult blood positive Vitamin B12 deficiency In the context of anticoagulation.  Transfused 2 units PRBC, trend  CBC.  No current active bleeding.  Hemodynamic stable. PPI Vitamin B12 supplementation GI consultation placed  Paroxysmal atrial fibrillation, acquired thrombophilia Currently in sinus rhythm.  Hold anticoagulation.  Thrombocytopenia Chronic to 2018, stable, follow-up as an outaptient  Gout  Constipation, unspecified constipation type Bowel regimen.   Elevated TSH, hypothyroidism Follow-up as an outpatient.  Random hyperglycemia Follow-up as an outpatient.   Advance Care Planning: Confirmed full code  Consults: GI Dr. Collene Mares  Family Communication: wife at bedside  Severity of Illness: The appropriate patient status for this patient  is OBSERVATION. Observation status is judged to be reasonable and necessary in order to provide the required intensity of service to ensure the patient's safety. The patient's presenting symptoms, physical exam findings, and initial radiographic and laboratory data in the context of their medical condition is felt to place them at decreased risk for further clinical deterioration. Furthermore, it is anticipated that the patient will be medically stable for discharge from the hospital within 2 midnights of admission.   Author: Murray Hodgkins, MD 10/02/2022 11:24 AM  For on call review [email or web address].

## 2022-10-02 NOTE — ED Notes (Signed)
ED TO INPATIENT HANDOFF REPORT  ED Nurse Name and Phone #: [phone]     S Name/Age/Gender Joel Ford Remedios 72 y.o. male Room/Bed: WA04/WA04  Code Status   Code Status: Full Code  Home/SNF/Other Home Patient oriented to: self, place, time, and situation Is this baseline? Yes   Triage Complete: Triage complete  Chief Complaint GIB (gastrointestinal bleeding) [K92.2]  Triage Note Sent by pcp for hemoglobin of 6.  Pt reports went to pcp yesterday for constipation and hemmrhiods x2 months.  reports sob and weakness x3 days.  Pt is on xarelto for A-fib.  Pt denies rectal bleeding, melena, hematochezia.  Hx of anemia but denies hx of blood transfusion  Denies cp, headache, dizziness    Allergies No Known Allergies  Level of Care/Admitting Diagnosis ED Disposition     ED Disposition  Admit   Condition  --   Comment  Hospital Area: [place] [id number]  Level of Care: Telemetry [5]  Admit to tele based on following criteria: Monitor for Ischemic changes  May place patient in observation at [facility] or [place] if equivalent level of care is available:: Yes  Covid Evaluation: Asymptomatic - no recent exposure (last 10 days) testing not required  Diagnosis: GIB (gastrointestinal bleeding) [id number]  Admitting Physician: Samuella Cota [place]  Attending Physician: Samuella Cota [[id number]]          B Medical/Surgery History Past Medical History:  Diagnosis Date   ABNORMAL ELECTROCARDIOGRAM 07/04/2009   Qualifier: Diagnosis of  By: Maxie Better FNP, Billie-Lynn Olena Heckle    Atrial fibrillation ([place])    Gout    HYPERGLYCEMIA 07/27/2009   Qualifier: Diagnosis of  By: Council Mechanic MD, Hilaria Ota    HYPERLIPIDEMIA 07/04/2009   Qualifier: Diagnosis of  By: Maxie Better FNP, [name]    Left knee DJD    OA   RENAL CALCULUS 02/15/2008   Qualifier: Diagnosis of  By: Maxie Better FNP, Rosalita Levan    S/P total knee replacement 08/07/2009   right    Thyroid disease    Past Surgical History:  Procedure Laterality Date   APPENDECTOMY     age 84   CARDIAC CATHETERIZATION  01/23/2005   Normal   FINGER SURGERY     RIGHT INDEX, REATTACHED   orthoscopy     Right and left knee   TOTAL KNEE ARTHROPLASTY  2011   Right   TOTAL KNEE ARTHROPLASTY Left 09/21/2012   Dr Noemi Chapel   TOTAL KNEE ARTHROPLASTY Left 09/21/2012   Procedure: TOTAL KNEE ARTHROPLASTY;  Surgeon: Lorn Junes, MD;  Location: [facility];  Service: Orthopedics;  Laterality: Left;     A IV Location/Drains/Wounds Patient Lines/Drains/Airways Status     Active Line/Drains/Airways     Name Placement date Placement time Site Days   Peripheral IV 10/02/22 20 G Anterior;Proximal;Right Forearm 10/02/22  0823  Forearm  less than 1   Peripheral IV 10/02/22 18 G Right Antecubital 10/02/22  0918  Antecubital  less than 1            Intake/Output Last 24 hours  Intake/Output Summary (Last 24 hours) at 10/02/2022 1140 Last data filed at 10/02/2022 [id number] Gross per 24 hour  Intake 100 ml  Output --  Net 100 ml    Labs/Imaging Results for orders placed or performed during the hospital encounter of 10/02/22 (from the past 48 hour(s))  Comprehensive metabolic panel     Status: Abnormal   Collection Time: 10/02/22  8:10 AM  Result  Value Ref Range   Sodium 139 135 - 145 mmol/L   Potassium 3.8 3.5 - 5.1 mmol/L   Chloride 108 98 - 111 mmol/L   CO2 24 22 - 32 mmol/L   Glucose, Bld 140 (H) 70 - 99 mg/dL    Comment: Glucose reference range applies only to samples taken after fasting for at least 8 hours.   BUN 20 8 - 23 mg/dL   Creatinine, Ser 0.77 0.61 - 1.24 mg/dL   Calcium 8.2 (L) 8.9 - 10.3 mg/dL   Total Protein 6.0 (L) 6.5 - 8.1 g/dL   Albumin 3.6 3.5 - 5.0 g/dL   AST 15 15 - 41 U/L   ALT 11 0 - 44 U/L   Alkaline Phosphatase 66 38 - 126 U/L   Total Bilirubin 0.7 0.3 - 1.2 mg/dL   GFR, Estimated >60 >60 mL/min    Comment: (NOTE) Calculated using the CKD-EPI Creatinine  Equation (2021)    Anion gap 7 5 - 15    Comment: Performed at [facility], [place] [address], [place], [place] [address]  CBC     Status: Abnormal   Collection Time: 10/02/22  8:10 AM  Result Value Ref Range   WBC 4.0 4.0 - 10.5 K/uL   RBC 2.81 (L) 4.22 - 5.81 MIL/uL   Hemoglobin 5.6 (LL) 13.0 - 17.0 g/dL    Comment: REPEATED TO VERIFY Reticulocyte Hemoglobin testing may be clinically indicated, consider ordering this additional test [id number] THIS CRITICAL RESULT HAS VERIFIED AND BEEN CALLED TO WAITES, L. RN BY NICOLE MCCOY ON 03 27 2024 AT 0838, AND HAS BEEN READ BACK.     HCT 21.6 (L) 39.0 - 52.0 %   MCV 76.9 (L) 80.0 - 100.0 fL   MCH 19.9 (L) 26.0 - 34.0 pg   MCHC 25.9 (L) 30.0 - 36.0 g/dL   RDW 16.7 (H) 11.5 - 15.5 %   Platelets 113 (L) 150 - 400 K/uL   nRBC 0.0 0.0 - 0.2 %    Comment: Performed at [facility], [place] [address], [place], [place] [address]  Type and screen [place]     Status: None (Preliminary result)   Collection Time: 10/02/22  8:10 AM  Result Value Ref Range   ABO/RH(D) AB POS    Antibody Screen NEG    Sample Expiration      10/05/2022,2359 Performed at [facility], [place] [address] [place], [place] [address]    Unit Number [id number]    Blood Component Type RBC LR PHER1    Unit division 00    Status of Unit ALLOCATED    Transfusion Status OK TO TRANSFUSE    Crossmatch Result Compatible    Unit Number [id number]    Blood Component Type RBC LR PHER1    Unit division 00    Status of Unit ALLOCATED    Transfusion Status OK TO TRANSFUSE    Crossmatch Result Compatible   Protime-INR     Status: Abnormal   Collection Time: 10/02/22  8:19 AM  Result Value Ref Range   Prothrombin Time 31.6 (H) 11.4 - 15.2 seconds   INR 3.1 (H) 0.8 - 1.2    Comment: (NOTE) INR goal varies based on device and disease states. Performed at [facility], [place] [address] [place], [place] [address]   POC occult blood, ED     Status: Abnormal   Collection Time: 10/02/22  8:36 AM  Result  Value Ref Range   Fecal Occult Blood POSITIVE (A)   Prepare RBC (crossmatch)     Status: None   Collection Time: 10/02/22  9:00 AM  Result Value Ref Range   Order Confirmation      ORDER PROCESSED BY BLOOD BANK Performed at [facility], [place] [address], [place], [place] [address]    DG Chest Port 1 View  Result Date: 10/02/2022 CLINICAL DATA:  Shortness of breath EXAM: PORTABLE CHEST 1 VIEW COMPARISON:  08/05/2016 chest radiograph FINDINGS: No pleural effusion. No pneumothorax. Normal cardiac and mediastinal contours. No focal airspace opacity no radiographically apparent displaced rib fractures. Visualized upper abdomen is unremarkable. Degenerative changes of the right AC joint. IMPRESSION: No focal airspace opacity. Electronically Signed   By: Marin Zakyra [place] M.D.   On: 10/02/2022 08:53    Pending Labs [employer] (From admission, onward)     Start     Ordered   Signed and Held  CBC  Tomorrow morning,   R        Signed and Held   Signed and Held  Basic metabolic panel  Tomorrow morning,   R        Signed and Held            Vitals/Pain Today's Vitals   10/02/22 0830 10/02/22 0900 10/02/22 0930 10/02/22 1100  BP: 118/65 110/66 106/61 113/65  Pulse: 83 76 78 77  Resp: 18 12 15 14   Temp:      TempSrc:      SpO2: 100% 100% 100% 100%  Weight:      Height:      PainSc:        Isolation Precautions No active isolations  Medications Medications  0.9 %  sodium chloride infusion (Manually program via Guardrails IV Fluids) (has no administration in time range)  pantoprozole (PROTONIX) 80 mg /NS 100 mL infusion (8 mg/hr Intravenous New Bag/Given 10/02/22 0909)  pantoprazole (PROTONIX) 80 mg /NS 100 mL IVPB (0 mg Intravenous Stopped 10/02/22 0954)    Mobility walks     Focused Assessments GI bleed, on thinners, hemoccult positive,  blood consent signed but no call from blood bank that blood is ready.  Constipation noted per patient   R Recommendations: See Admitting Provider Note  Report given to:   Additional Notes: [id number]

## 2022-10-02 NOTE — Hospital Course (Addendum)
73 year old man presented with lightheadedness, shortness of breath on exertion, found to be severely anemic.  Admitted for GI evaluation.

## 2022-10-02 NOTE — Consult Note (Signed)
UNASSIGNED PATIENT Reason for Consult: Severe anemia with melena. Referring Physician: THP.  Joel Ford is an 72 y.o. male.  HPI: Joel. Joel Ford is a 72 year old white male with multiple medical problems listed below who presented to the emergency room with a history of shortness of breath and constipation. He can go couple of days without having a bowel movement he was noted to have a hemoglobin of 5.6 g/dL by his PCP and was was referred to the hospital for further evaluation and treatment.  His hemoglobin on 12/04/2021 was 10.4 g/dL he denies having any melena or hematochezia.  Has been on iron supplements. He has become lightheaded recently with exertion he denies having any chest pain nausea vomiting. Last dose of Xarelto was yesterday.  He admits to taking occasional Aleve or Advil for aches aches and pains.  He claims his last colonoscopy was done about 30 years ago and again he cannot remember who did his procedure.  He was noted to be guaiac positive on exam in the ER.  Past Medical History:  Diagnosis Date   ABNORMAL ELECTROCARDIOGRAM 07/04/2009   Qualifier: Diagnosis of  By: Maxie Better FNP, Rosalita Levan    Atrial fibrillation ([place])    Gout    HYPERGLYCEMIA 07/27/2009   Qualifier: Diagnosis of  By: Council Mechanic MD, Hilaria Ota    HYPERLIPIDEMIA 07/04/2009   Qualifier: Diagnosis of  By: Maxie Better FNP, [name]    Left knee DJD    OA   RENAL CALCULUS 02/15/2008   Qualifier: Diagnosis of  By: Maxie Better FNP, Rosalita Levan    S/P total knee replacement 08/07/2009   right   Thyroid disease    Past Surgical History:  Procedure Laterality Date   APPENDECTOMY     age 36   CARDIAC CATHETERIZATION  01/23/2005   Normal   FINGER SURGERY     RIGHT INDEX, REATTACHED   orthoscopy     Right and left knee   TOTAL KNEE ARTHROPLASTY  2011   Right   TOTAL KNEE ARTHROPLASTY Left 09/21/2012   Dr Noemi Chapel   TOTAL KNEE ARTHROPLASTY Left 09/21/2012   Procedure: TOTAL KNEE ARTHROPLASTY;   Surgeon: Lorn Junes, MD;  Location: [facility];  Service: Orthopedics;  Laterality: Left;   Family History  Problem Relation Age of Onset   Stroke Father    Healthy Mother    Healthy Brother    Healthy Son    Colon cancer Neg Hx    Prostate cancer Neg Hx    Social History:  reports that he has never smoked. His smokeless tobacco use includes chew. He reports that he does not drink alcohol and does not use drugs.  Allergies: No Known Allergies  Medications: I have reviewed the patient's current medications. Prior to Admission:  Medications Prior to Admission  Medication Sig Dispense Refill Last Dose   allopurinol (ZYLOPRIM) 300 MG tablet Take 300 mg by mouth daily.      colchicine 0.6 MG tablet Take 0.6 mg by mouth daily.      ferrous sulfate 325 (65 FE) MG EC tablet TAKE 1 TABLET (325 MG TOTAL) BY MOUTH DAILY WITH BREAKFAST. NEED LABS 90 tablet 1    gabapentin (NEURONTIN) 100 MG capsule Take one tablet daily for 3 days and then increase as needed up to three times a day. 90 capsule 0    hydrocortisone (ANUSOL-HC) 2.5 % rectal cream Place 1 Application rectally 2 (two) times daily. 30 g 0    hydrocortisone (ANUSOL-HC) 25  MG suppository Place 1 suppository (25 mg total) rectally 2 (two) times daily as needed for hemorrhoids. 24 suppository 1    lidocaine (XYLOCAINE) 5 % ointment Apply 1 Application topically 3 (three) times daily as needed. 50 g 1    linaclotide (LINZESS) 145 MCG CAPS capsule Take 1 capsule (145 mcg total) by mouth daily. 30 capsule 2    metoprolol tartrate (LOPRESSOR) 25 MG tablet Take 1 tablet (25 mg total) by mouth 2 (two) times daily. 180 tablet 0    omeprazole (PRILOSEC) 40 MG capsule Take 1 capsule (40 mg total) by mouth daily. Needs appt 30 capsule 0    rivaroxaban (XARELTO) 20 MG TABS tablet TAKE 1 TABLET BY MOUTH DAILY WITH SUPPER 90 tablet 1    tiZANidine (ZANAFLEX) 4 MG tablet Take 0.5 tablets (2 mg total) by mouth every 8 (eight) hours as needed for muscle  spasms. 20 tablet 0    traZODone (DESYREL) 50 MG tablet Take 0.5-1 tablets (25-50 mg total) by mouth at bedtime as needed for sleep. 30 tablet 3    Scheduled:  sodium chloride   Intravenous Once   allopurinol  300 mg Oral Daily   cyanocobalamin  1,000 mcg Intramuscular Daily   metoprolol tartrate  25 mg Oral BID   polyethylene glycol  17 g Oral BID   senna  1 tablet Oral BID   sodium chloride flush  3 mL Intravenous Q12H   Continuous:  sodium chloride     pantoprazole 8 mg/hr (10/02/22 0909)   [id number] **OR** acetaminophen, ondansetron **OR** ondansetron (ZOFRAN) IV  Results for orders placed or performed during the hospital encounter of 10/02/22 (from the past 48 hour(s))  Comprehensive metabolic panel     Status: Abnormal   Collection Time: 10/02/22  8:10 AM  Result Value Ref Range   Sodium 139 135 - 145 mmol/L   Potassium 3.8 3.5 - 5.1 mmol/L   Chloride 108 98 - 111 mmol/L   CO2 24 22 - 32 mmol/L   Glucose, Bld 140 (H) 70 - 99 mg/dL    Comment: Glucose reference range applies only to samples taken after fasting for at least 8 hours.   BUN 20 8 - 23 mg/dL   Creatinine, Ser 0.77 0.61 - 1.24 mg/dL   Calcium 8.2 (L) 8.9 - 10.3 mg/dL   Total Protein 6.0 (L) 6.5 - 8.1 g/dL   Albumin 3.6 3.5 - 5.0 g/dL   AST 15 15 - 41 U/L   ALT 11 0 - 44 U/L   Alkaline Phosphatase 66 38 - 126 U/L   Total Bilirubin 0.7 0.3 - 1.2 mg/dL   GFR, Estimated >60 >60 mL/min    Comment: (NOTE) Calculated using the CKD-EPI Creatinine Equation (2021)    Anion gap 7 5 - 15    Comment: Performed at [facility], [place] [address] [place], [place] [address]  CBC     Status: Abnormal   Collection Time: 10/02/22  8:10 AM  Result Value Ref Range   WBC 4.0 4.0 - 10.5 K/uL   RBC 2.81 (L) 4.22 - 5.81 MIL/uL   Hemoglobin 5.6 (LL) 13.0 - 17.0 g/dL    Comment: REPEATED TO VERIFY Reticulocyte Hemoglobin testing may be clinically indicated, consider ordering this additional test  [id number] THIS CRITICAL RESULT HAS VERIFIED AND BEEN CALLED TO WAITES, L. RN BY NICOLE MCCOY ON 03 27 2024 AT 0838, AND HAS BEEN READ BACK.     HCT 21.6 (L) 39.0 - 52.0 %  MCV 76.9 (L) 80.0 - 100.0 fL   MCH 19.9 (L) 26.0 - 34.0 pg   MCHC 25.9 (L) 30.0 - 36.0 g/dL   RDW 16.7 (H) 11.5 - 15.5 %   Platelets 113 (L) 150 - 400 K/uL   nRBC 0.0 0.0 - 0.2 %    Comment: Performed at [facility], [place] [address], [place], [place] [address]  Type and screen [place]     Status: None (Preliminary result)   Collection Time: 10/02/22  8:10 AM  Result Value Ref Range   ABO/RH(D) AB POS    Antibody Screen NEG    Sample Expiration      10/05/2022,2359 Performed at [facility], [place] [address] [place], [place] [address]    Unit Number [id number]    Blood Component Type RBC LR PHER1    Unit division 00    Status of Unit ALLOCATED    Transfusion Status OK TO TRANSFUSE    Crossmatch Result Compatible    Unit Number [id number]    Blood Component Type RBC LR PHER1    Unit division 00    Status of Unit ALLOCATED    Transfusion Status OK TO TRANSFUSE    Crossmatch Result Compatible   Protime-INR     Status: Abnormal   Collection Time: 10/02/22  8:19 AM  Result Value Ref Range   Prothrombin Time 31.6 (H) 11.4 - 15.2 seconds   INR 3.1 (H) 0.8 - 1.2    Comment: (NOTE) INR goal varies based on device and disease states. Performed at [facility], [place] [address] [place], [place] [address]   POC occult blood, ED     Status: Abnormal   Collection Time: 10/02/22  8:36 AM  Result Value Ref Range   Fecal Occult Blood POSITIVE (A)   Prepare RBC (crossmatch)     Status: None   Collection Time: 10/02/22  9:00 AM  Result Value Ref Range   Order Confirmation      ORDER PROCESSED BY BLOOD BANK Performed at [place] [address] [place], [place] [address]    DG Chest Port 1 View  Result  Date: 10/02/2022 CLINICAL DATA:  Shortness of breath EXAM: PORTABLE CHEST 1 VIEW COMPARISON:  08/05/2016 chest radiograph FINDINGS: No pleural effusion. No pneumothorax. Normal cardiac and mediastinal contours. No focal airspace opacity no radiographically apparent displaced rib fractures. Visualized upper abdomen is unremarkable. Degenerative changes of the right AC joint. IMPRESSION: No focal airspace opacity. Electronically Signed   By: Marin Roberts M.D.   On: 10/02/2022 08:53    Review of Systems  Constitutional:  Positive for activity change and fatigue. Negative for appetite change, chills, diaphoresis, fever and unexpected weight change.  HENT: Negative.    Eyes: Negative.   Respiratory:  Positive for shortness of breath. Negative for cough, choking, chest tightness, wheezing and stridor.   Gastrointestinal:  Positive for blood in stool and constipation. Negative for abdominal distention, abdominal pain, anal bleeding, diarrhea, nausea, rectal pain and vomiting.  Endocrine: Negative.   Genitourinary: Negative.  Negative for penile pain.  Musculoskeletal:  Positive for arthralgias. Negative for back pain, gait problem, joint swelling, myalgias, neck pain and neck stiffness.  Neurological:  Positive for dizziness, weakness and light-headedness.  Hematological: Negative.   Psychiatric/Behavioral: Negative.     Blood pressure (!) 109/59, pulse 77, temperature 98.2 F (36.8 C), temperature source Oral, resp. rate 14, height 5\' 10"  (1.778 m), weight 85.3  kg, SpO2 100 %. Physical Exam Constitutional:      General: He is not in acute distress.    Appearance: He is well-developed. He is not ill-appearing.  HENT:     Head: Normocephalic and atraumatic.  Eyes:     Extraocular Movements: Extraocular movements intact.     Pupils: Pupils are equal, round, and reactive to light.  Cardiovascular:     Rate and Rhythm: Normal rate and regular rhythm.  Pulmonary:     Effort: Pulmonary effort is  normal.     Breath sounds: Normal breath sounds.  Abdominal:     General: Bowel sounds are normal.     Palpations: Abdomen is soft.     Comments: There is a surgical scar in the right lower quadrant from a previous appendectomy  Musculoskeletal:        General: Normal range of motion.     Cervical back: Normal range of motion and neck supple.  Skin:    General: Skin is warm and dry.  Neurological:     General: No focal deficit present.     Mental Status: He is oriented to person, place, and time.  Psychiatric:        Mood and Affect: Mood normal.        Behavior: Behavior normal.    Assessment/Plan: 1) Severe iron deficiency anemia with guaiac positive stools-an EGD and colonoscopy being planned for the patient tomorrow prep orders have been written. 2) Constipation. 3) Atrial fibrillation on Xarelto which is currently on hold. 4) Hyperlipidemia. 5) Gout. Juanita Craver 10/02/2022, 12:11 PM

## 2022-10-02 NOTE — Progress Notes (Deleted)
.  tocscr

## 2022-10-02 NOTE — Progress Notes (Signed)
YOUR hemoglobin is very low. You need blood. This is certainly why you could be short of breath intermittently. You need to go to [place] or [place] ED and tell they to check chart hemoglobin was 6.0.   B12 is low but we can work on that once we get your hemoglobin up.   You are also hypothyroid. You are not currrently on medication. Did you stop it?   Your gluocose is also very high. I want to add A1C.

## 2022-10-02 NOTE — ED Provider Notes (Signed)
[place] EMERGENCY DEPARTMENT AT [facility] Provider Note   CSN: [id number] Arrival date & time: 10/02/22  0801     History  Chief Complaint  Patient presents with   Shortness of Breath   Weakness    Renard Mohamad is a 72 y.o. male.  Pt is a 72 yo male with pmhx significant for gout, hld, kidney stones, and afib on Xarelto.  Pt has been having problems with hemorrhoids and constipation for the past 2 months.  He's been feeling sob and weak for the past 3 days.  Pt went to pcp yesterday who drew blood.  Hgb came back low at 6 (hgb in June was 10.3).  Pt was told to come to the ED.  He has not noticed black stools, but he has not been looking.  Pt does not have a GI doctor.       Home Medications Prior to Admission medications   Medication Sig Start Date End Date Taking? Authorizing Provider  allopurinol (ZYLOPRIM) 300 MG tablet Take 300 mg by mouth daily. 01/27/19   [provider]  colchicine 0.6 MG tablet Take 0.6 mg by mouth daily.    [provider]  ferrous sulfate 325 (65 FE) MG EC tablet TAKE 1 TABLET (325 MG TOTAL) BY MOUTH DAILY WITH BREAKFAST. NEED LABS 07/19/22   Iran Planas L, PA-C  gabapentin (NEURONTIN) 100 MG capsule Take one tablet daily for 3 days and then increase as needed up to three times a day. 12/21/21   Breeback, Royetta Car, PA-C  hydrocortisone (ANUSOL-HC) 2.5 % rectal cream Place 1 Application rectally 2 (two) times daily. 07/19/22   Breeback, Royetta Car, PA-C  hydrocortisone (ANUSOL-HC) 25 MG suppository Place 1 suppository (25 mg total) rectally 2 (two) times daily as needed for hemorrhoids. 07/19/22   Breeback, Jade L, PA-C  lidocaine (XYLOCAINE) 5 % ointment Apply 1 Application topically 3 (three) times daily as needed. 07/16/22   Breeback, Royetta Car, PA-C  linaclotide (LINZESS) 145 MCG CAPS capsule Take 1 capsule (145 mcg total) by mouth daily. 10/01/22   Breeback, Luvenia Starch L, PA-C  metoprolol tartrate (LOPRESSOR) 25 MG tablet Take 1 tablet  (25 mg total) by mouth 2 (two) times daily. 09/05/22   Breeback, Royetta Car, PA-C  omeprazole (PRILOSEC) 40 MG capsule Take 1 capsule (40 mg total) by mouth daily. Needs appt 02/08/22   Donella Stade, PA-C  rivaroxaban (XARELTO) 20 MG TABS tablet TAKE 1 TABLET BY MOUTH DAILY WITH SUPPER 07/30/22   Wellington Hampshire, MD  tiZANidine (ZANAFLEX) 4 MG tablet Take 0.5 tablets (2 mg total) by mouth every 8 (eight) hours as needed for muscle spasms. 12/13/21   Teodora Medici, FNP  traZODone (DESYREL) 50 MG tablet Take 0.5-1 tablets (25-50 mg total) by mouth at bedtime as needed for sleep. 10/01/22   Donella Stade, PA-C      Allergies    Patient has no known allergies.    Review of Systems   Review of Systems  Respiratory:  Positive for shortness of breath.   Gastrointestinal:  Positive for constipation.  Neurological:  Positive for weakness.  All other systems reviewed and are negative.   Physical Exam Updated Vital Signs BP 106/61   Pulse 78   Temp (!) 97.5 F (36.4 C) (Oral)   Resp 15   Ht 5\' 10"  (1.778 m)   Wt 85.3 kg   SpO2 100%   BMI 26.98 kg/m  Physical Exam Vitals and nursing  note reviewed.  Constitutional:      Appearance: He is well-developed.  HENT:     Head: Normocephalic and atraumatic.     Mouth/Throat:     Mouth: Mucous membranes are moist.     Pharynx: Oropharynx is clear.  Eyes:     Extraocular Movements: Extraocular movements intact.     Pupils: Pupils are equal, round, and reactive to light.  Cardiovascular:     Rate and Rhythm: Normal rate and regular rhythm.  Pulmonary:     Effort: Pulmonary effort is normal.     Breath sounds: Normal breath sounds.  Abdominal:     General: Bowel sounds are normal.     Palpations: Abdomen is soft.  Genitourinary:    Rectum: Guaiac result positive.  Musculoskeletal:        General: Normal range of motion.     Cervical back: Normal range of motion and neck supple.  Skin:    General: Skin is warm.     Capillary Refill:  Capillary refill takes less than 2 seconds.  Neurological:     General: No focal deficit present.     Mental Status: He is alert and oriented to person, place, and time.  Psychiatric:        Mood and Affect: Mood normal.        Behavior: Behavior normal.     ED Results / Procedures / Treatments   Labs (all labs ordered are listed, but only abnormal results are displayed) Labs Reviewed  COMPREHENSIVE METABOLIC PANEL - Abnormal; Notable for the following components:      Result Value   Glucose, Bld 140 (*)    Calcium 8.2 (*)    Total Protein 6.0 (*)    All other components within normal limits  CBC - Abnormal; Notable for the following components:   RBC 2.81 (*)    Hemoglobin 5.6 (*)    HCT 21.6 (*)    MCV 76.9 (*)    MCH 19.9 (*)    MCHC 25.9 (*)    RDW 16.7 (*)    Platelets 113 (*)    All other components within normal limits  PROTIME-INR - Abnormal; Notable for the following components:   Prothrombin Time 31.6 (*)    INR 3.1 (*)    All other components within normal limits  POC OCCULT BLOOD, ED - Abnormal; Notable for the following components:   Fecal Occult Blood POSITIVE (*)    All other components within normal limits  TYPE AND SCREEN  PREPARE RBC (CROSSMATCH)    EKG EKG Interpretation  Date/Time:  Wednesday October 02 2022 08:22:35 EDT Ventricular Rate:  79 PR Interval:  113 QRS Duration: 100 QT Interval:  389 QTC Calculation: 446 R Axis:   50 Text Interpretation: Sinus rhythm Borderline short PR interval Abnormal R-wave progression, early transition Abnrm T, consider ischemia, anterolateral lds now in sinus Confirmed by Isla Pence [phone]) on 10/02/2022 8:47:50 AM  Radiology DG Chest Port 1 View  Result Date: 10/02/2022 CLINICAL DATA:  Shortness of breath EXAM: PORTABLE CHEST 1 VIEW COMPARISON:  08/05/2016 chest radiograph FINDINGS: No pleural effusion. No pneumothorax. Normal cardiac and mediastinal contours. No focal airspace opacity no  radiographically apparent displaced rib fractures. Visualized upper abdomen is unremarkable. Degenerative changes of the right AC joint. IMPRESSION: No focal airspace opacity. Electronically Signed   By: Marin Roberts M.D.   On: 10/02/2022 08:53    Procedures Procedures    Medications Ordered in ED Medications  0.9 %  sodium chloride infusion (Manually program via Guardrails IV Fluids) (has no administration in time range)  pantoprozole (PROTONIX) 80 mg /NS 100 mL infusion (8 mg/hr Intravenous New Bag/Given 10/02/22 0909)  pantoprazole (PROTONIX) 80 mg /NS 100 mL IVPB (0 mg Intravenous Stopped 10/02/22 0954)    ED Course/ Medical Decision Making/ A&P                             Medical Decision Making Amount and/or Complexity of Data Reviewed Labs: ordered. Radiology: ordered.  Risk Prescription drug management. Decision regarding hospitalization.   This patient presents to the ED for concern of anemia, this involves an extensive number of treatment options, and is a complaint that carries with it a high risk of complications and morbidity.  The differential diagnosis includes gi bleed, other source of bleeding   Co morbidities that complicate the patient evaluation  gout, hld, kidney stones, and afib on Xarelto   Additional history obtained:  Additional history obtained from epic chart review External records from outside source obtained and reviewed including wife   Lab Tests:  I Ordered, and personally interpreted labs.  The pertinent results include:  hgb 5.6 (hgb 6 yesterday and 10 in June), plt low at 113 (chronic); cmp nl inr 3.1   Imaging Studies ordered:  I ordered imaging studies including cxr  I independently visualized and interpreted imaging which showed No focal airspace opacity.  I agree with the radiologist interpretation   Cardiac Monitoring:  The patient was maintained on a cardiac monitor.  I personally viewed and interpreted the cardiac  monitored which showed an underlying rhythm of: nsr   Medicines ordered and prescription drug management:  I ordered medication including protonix  for gi bleed  Reevaluation of the patient after these medicines showed that the patient improved I have reviewed the patients home medicines and have made adjustments as needed  Critical Interventions:  transfusion   Consultations Obtained:  I requested consultation with the gastroenterologist (Dr. Collene Mares),  and discussed lab and imaging findings as well as pertinent plan - she will see pt in consult. Pt d/w Dr. Sarajane Jews ([facility]) who will admit   Problem List / ED Course:  Symptomatic anemia:  pt's hgb is down to 5.6.  2 units for transfusion ordered. GI bleed:  Protonix started.  GI consulted. [facility] hospitalists will admit. On Xarelto:  will need to hold Thrombocytopenia: chronic   Reevaluation:  After the interventions noted above, I reevaluated the patient and found that they have :improved   Social Determinants of Health:  Lives at home   Dispostion:  After consideration of the diagnostic results and the patients response to treatment, I feel that the patent would benefit from admission.    CRITICAL CARE Performed by: Isla Pence   Total critical care time: 30 minutes  Critical care time was exclusive of separately billable procedures and treating other patients.  Critical care was necessary to treat or prevent imminent or life-threatening deterioration.  Critical care was time spent personally by me on the following activities: development of treatment plan with patient and/or surrogate as well as nursing, discussions with consultants, evaluation of patient's response to treatment, examination of patient, obtaining history from patient or surrogate, ordering and performing treatments and interventions, ordering and review of laboratory studies, ordering and review of radiographic studies, pulse oximetry and  re-evaluation of patient's condition.         Final Clinical Impression(s) /  ED Diagnoses Final diagnoses:  Upper GI bleed  Symptomatic anemia  On rivaroxaban therapy  Elevated INR  Thrombocytopenia ([place])    Rx / DC Orders ED Discharge Orders     None         Isla Pence, MD 10/02/22 1010

## 2022-10-02 NOTE — H&P (View-Only) (Signed)
UNASSIGNED PATIENT Reason for Consult: Severe anemia with melena. Referring Physician: THP.  Joel Ford is an 72 y.o. male.  HPI: Joel Ford is a 72 year old white male with multiple medical problems listed below who presented to the emergency room with a history of shortness of breath and constipation. He can go couple of days without having a bowel movement he was noted to have a hemoglobin of 5.6 g/dL by his PCP and was was referred to the hospital for further evaluation and treatment.  His hemoglobin on 12/04/2021 was 10.4 g/dL he denies having any melena or hematochezia.  Has been on iron supplements. He has become lightheaded recently with exertion he denies having any chest pain nausea vomiting. Last dose of Xarelto was yesterday.  He admits to taking occasional Aleve or Advil for aches aches and pains.  He claims his last colonoscopy was done about 30 years ago and again he cannot remember who did his procedure.  He was noted to be guaiac positive on exam in the ER.  Past Medical History:  Diagnosis Date   ABNORMAL ELECTROCARDIOGRAM 07/04/2009   Qualifier: Diagnosis of  By: Maxie Better FNP, Rosalita Levan    Atrial fibrillation ([place])    Gout    HYPERGLYCEMIA 07/27/2009   Qualifier: Diagnosis of  By: Council Mechanic MD, Hilaria Ota    HYPERLIPIDEMIA 07/04/2009   Qualifier: Diagnosis of  By: Maxie Better FNP, [name]    Left knee DJD    OA   RENAL CALCULUS 02/15/2008   Qualifier: Diagnosis of  By: Maxie Better FNP, Rosalita Levan    S/P total knee replacement 08/07/2009   right   Thyroid disease    Past Surgical History:  Procedure Laterality Date   APPENDECTOMY     age 14   CARDIAC CATHETERIZATION  01/23/2005   Normal   FINGER SURGERY     RIGHT INDEX, REATTACHED   orthoscopy     Right and left knee   TOTAL KNEE ARTHROPLASTY  2011   Right   TOTAL KNEE ARTHROPLASTY Left 09/21/2012   Dr Noemi Chapel   TOTAL KNEE ARTHROPLASTY Left 09/21/2012   Procedure: TOTAL KNEE ARTHROPLASTY;   Surgeon: Lorn Junes, MD;  Location: [facility];  Service: Orthopedics;  Laterality: Left;   Family History  Problem Relation Age of Onset   Stroke Father    Healthy Mother    Healthy Brother    Healthy Son    Colon cancer Neg Hx    Prostate cancer Neg Hx    Social History:  reports that he has never smoked. His smokeless tobacco use includes chew. He reports that he does not drink alcohol and does not use drugs.  Allergies: No Known Allergies  Medications: I have reviewed the patient's current medications. Prior to Admission:  Medications Prior to Admission  Medication Sig Dispense Refill Last Dose   allopurinol (ZYLOPRIM) 300 MG tablet Take 300 mg by mouth daily.      colchicine 0.6 MG tablet Take 0.6 mg by mouth daily.      ferrous sulfate 325 (65 FE) MG EC tablet TAKE 1 TABLET (325 MG TOTAL) BY MOUTH DAILY WITH BREAKFAST. NEED LABS 90 tablet 1    gabapentin (NEURONTIN) 100 MG capsule Take one tablet daily for 3 days and then increase as needed up to three times a day. 90 capsule 0    hydrocortisone (ANUSOL-HC) 2.5 % rectal cream Place 1 Application rectally 2 (two) times daily. 30 g 0    hydrocortisone (ANUSOL-HC) 25  MG suppository Place 1 suppository (25 mg total) rectally 2 (two) times daily as needed for hemorrhoids. 24 suppository 1    lidocaine (XYLOCAINE) 5 % ointment Apply 1 Application topically 3 (three) times daily as needed. 50 g 1    linaclotide (LINZESS) 145 MCG CAPS capsule Take 1 capsule (145 mcg total) by mouth daily. 30 capsule 2    metoprolol tartrate (LOPRESSOR) 25 MG tablet Take 1 tablet (25 mg total) by mouth 2 (two) times daily. 180 tablet 0    omeprazole (PRILOSEC) 40 MG capsule Take 1 capsule (40 mg total) by mouth daily. Needs appt 30 capsule 0    rivaroxaban (XARELTO) 20 MG TABS tablet TAKE 1 TABLET BY MOUTH DAILY WITH SUPPER 90 tablet 1    tiZANidine (ZANAFLEX) 4 MG tablet Take 0.5 tablets (2 mg total) by mouth every 8 (eight) hours as needed for muscle  spasms. 20 tablet 0    traZODone (DESYREL) 50 MG tablet Take 0.5-1 tablets (25-50 mg total) by mouth at bedtime as needed for sleep. 30 tablet 3    Scheduled:  sodium chloride   Intravenous Once   allopurinol  300 mg Oral Daily   cyanocobalamin  1,000 mcg Intramuscular Daily   metoprolol tartrate  25 mg Oral BID   polyethylene glycol  17 g Oral BID   senna  1 tablet Oral BID   sodium chloride flush  3 mL Intravenous Q12H   Continuous:  sodium chloride     pantoprazole 8 mg/hr (10/02/22 0909)   [id number] **OR** acetaminophen, ondansetron **OR** ondansetron (ZOFRAN) IV  Results for orders placed or performed during the hospital encounter of 10/02/22 (from the past 48 hour(s))  Comprehensive metabolic panel     Status: Abnormal   Collection Time: 10/02/22  8:10 AM  Result Value Ref Range   Sodium 139 135 - 145 mmol/L   Potassium 3.8 3.5 - 5.1 mmol/L   Chloride 108 98 - 111 mmol/L   CO2 24 22 - 32 mmol/L   Glucose, Bld 140 (H) 70 - 99 mg/dL    Comment: Glucose reference range applies only to samples taken after fasting for at least 8 hours.   BUN 20 8 - 23 mg/dL   Creatinine, Ser 0.77 0.61 - 1.24 mg/dL   Calcium 8.2 (L) 8.9 - 10.3 mg/dL   Total Protein 6.0 (L) 6.5 - 8.1 g/dL   Albumin 3.6 3.5 - 5.0 g/dL   AST 15 15 - 41 U/L   ALT 11 0 - 44 U/L   Alkaline Phosphatase 66 38 - 126 U/L   Total Bilirubin 0.7 0.3 - 1.2 mg/dL   GFR, Estimated >60 >60 mL/min    Comment: (NOTE) Calculated using the CKD-EPI Creatinine Equation (2021)    Anion gap 7 5 - 15    Comment: Performed at [facility], [place] [address] [place], [place] [address]  CBC     Status: Abnormal   Collection Time: 10/02/22  8:10 AM  Result Value Ref Range   WBC 4.0 4.0 - 10.5 K/uL   RBC 2.81 (L) 4.22 - 5.81 MIL/uL   Hemoglobin 5.6 (LL) 13.0 - 17.0 g/dL    Comment: REPEATED TO VERIFY Reticulocyte Hemoglobin testing may be clinically indicated, consider ordering this additional test  [id number] THIS CRITICAL RESULT HAS VERIFIED AND BEEN CALLED TO WAITES, L. RN BY NICOLE MCCOY ON 03 27 2024 AT 0838, AND HAS BEEN READ BACK.     HCT 21.6 (L) 39.0 - 52.0 %  MCV 76.9 (L) 80.0 - 100.0 fL   MCH 19.9 (L) 26.0 - 34.0 pg   MCHC 25.9 (L) 30.0 - 36.0 g/dL   RDW 16.7 (H) 11.5 - 15.5 %   Platelets 113 (L) 150 - 400 K/uL   nRBC 0.0 0.0 - 0.2 %    Comment: Performed at [facility], [place] [address] [place], [place] [address]  Type and screen [place]     Status: None (Preliminary result)   Collection Time: 10/02/22  8:10 AM  Result Value Ref Range   ABO/RH(D) AB POS    Antibody Screen NEG    Sample Expiration      10/05/2022,2359 Performed at [facility], [place] [address], [place], [place] [address]    Unit Number [id number]    Blood Component Type RBC LR PHER1    Unit division 00    Status of Unit ALLOCATED    Transfusion Status OK TO TRANSFUSE    Crossmatch Result Compatible    Unit Number [id number]    Blood Component Type RBC LR PHER1    Unit division 00    Status of Unit ALLOCATED    Transfusion Status OK TO TRANSFUSE    Crossmatch Result Compatible   Protime-INR     Status: Abnormal   Collection Time: 10/02/22  8:19 AM  Result Value Ref Range   Prothrombin Time 31.6 (H) 11.4 - 15.2 seconds   INR 3.1 (H) 0.8 - 1.2    Comment: (NOTE) INR goal varies based on device and disease states. Performed at [facility], [place] [address], [place], [place] [address]   POC occult blood, ED     Status: Abnormal   Collection Time: 10/02/22  8:36 AM  Result Value Ref Range   Fecal Occult Blood POSITIVE (A)   Prepare RBC (crossmatch)     Status: None   Collection Time: 10/02/22  9:00 AM  Result Value Ref Range   Order Confirmation      ORDER PROCESSED BY BLOOD BANK Performed at [place] [address] [place], [place] [address]    DG Chest Port 1 View  Result  Date: 10/02/2022 CLINICAL DATA:  Shortness of breath EXAM: PORTABLE CHEST 1 VIEW COMPARISON:  08/05/2016 chest radiograph FINDINGS: No pleural effusion. No pneumothorax. Normal cardiac and mediastinal contours. No focal airspace opacity no radiographically apparent displaced rib fractures. Visualized upper abdomen is unremarkable. Degenerative changes of the right AC joint. IMPRESSION: No focal airspace opacity. Electronically Signed   By: Marin Roberts M.D.   On: 10/02/2022 08:53    Review of Systems  Constitutional:  Positive for activity change and fatigue. Negative for appetite change, chills, diaphoresis, fever and unexpected weight change.  HENT: Negative.    Eyes: Negative.   Respiratory:  Positive for shortness of breath. Negative for cough, choking, chest tightness, wheezing and stridor.   Gastrointestinal:  Positive for blood in stool and constipation. Negative for abdominal distention, abdominal pain, anal bleeding, diarrhea, nausea, rectal pain and vomiting.  Endocrine: Negative.   Genitourinary: Negative.  Negative for penile pain.  Musculoskeletal:  Positive for arthralgias. Negative for back pain, gait problem, joint swelling, myalgias, neck pain and neck stiffness.  Neurological:  Positive for dizziness, weakness and light-headedness.  Hematological: Negative.   Psychiatric/Behavioral: Negative.     Blood pressure (!) 109/59, pulse 77, temperature 98.2 F (36.8 C), temperature source Oral, resp. rate 14, height 5\' 10"  (1.778 m), weight 85.3  kg, SpO2 100 %. Physical Exam Constitutional:      General: He is not in acute distress.    Appearance: He is well-developed. He is not ill-appearing.  HENT:     Head: Normocephalic and atraumatic.  Eyes:     Extraocular Movements: Extraocular movements intact.     Pupils: Pupils are equal, round, and reactive to light.  Cardiovascular:     Rate and Rhythm: Normal rate and regular rhythm.  Pulmonary:     Effort: Pulmonary effort is  normal.     Breath sounds: Normal breath sounds.  Abdominal:     General: Bowel sounds are normal.     Palpations: Abdomen is soft.     Comments: There is a surgical scar in the right lower quadrant from a previous appendectomy  Musculoskeletal:        General: Normal range of motion.     Cervical back: Normal range of motion and neck supple.  Skin:    General: Skin is warm and dry.  Neurological:     General: No focal deficit present.     Mental Status: He is oriented to person, place, and time.  Psychiatric:        Mood and Affect: Mood normal.        Behavior: Behavior normal.    Assessment/Plan: 1) Severe iron deficiency anemia with guaiac positive stools-an EGD and colonoscopy being planned for the patient tomorrow prep orders have been written. 2) Constipation. 3) Atrial fibrillation on Xarelto which is currently on hold. 4) Hyperlipidemia. 5) Gout. Juanita Craver 10/02/2022, 12:11 PM

## 2022-10-03 ENCOUNTER — Observation Stay (HOSPITAL_COMMUNITY): Payer: Medicare HMO | Admitting: Certified Registered Nurse Anesthetist

## 2022-10-03 ENCOUNTER — Encounter (HOSPITAL_COMMUNITY)
Admission: EM | Disposition: A | Discharge status: Home / Self Care | Payer: Self-pay | Source: Home / Self Care | Attending: Emergency Medicine

## 2022-10-03 ENCOUNTER — Observation Stay (HOSPITAL_BASED_OUTPATIENT_CLINIC_OR_DEPARTMENT_OTHER): Payer: Medicare HMO | Admitting: Certified Registered Nurse Anesthetist

## 2022-10-03 ENCOUNTER — Encounter (HOSPITAL_COMMUNITY): Payer: Self-pay | Admitting: Family Medicine

## 2022-10-03 DIAGNOSIS — I8510 Secondary esophageal varices without bleeding: Secondary | ICD-10-CM | POA: Diagnosis not present

## 2022-10-03 DIAGNOSIS — D128 Benign neoplasm of rectum: Secondary | ICD-10-CM

## 2022-10-03 DIAGNOSIS — K317 Polyp of stomach and duodenum: Secondary | ICD-10-CM | POA: Diagnosis not present

## 2022-10-03 DIAGNOSIS — I10 Essential (primary) hypertension: Secondary | ICD-10-CM | POA: Diagnosis not present

## 2022-10-03 DIAGNOSIS — K5730 Diverticulosis of large intestine without perforation or abscess without bleeding: Secondary | ICD-10-CM

## 2022-10-03 DIAGNOSIS — K3189 Other diseases of stomach and duodenum: Secondary | ICD-10-CM

## 2022-10-03 DIAGNOSIS — D649 Anemia, unspecified: Secondary | ICD-10-CM

## 2022-10-03 DIAGNOSIS — K766 Portal hypertension: Secondary | ICD-10-CM | POA: Diagnosis not present

## 2022-10-03 DIAGNOSIS — K621 Rectal polyp: Secondary | ICD-10-CM | POA: Diagnosis not present

## 2022-10-03 DIAGNOSIS — I4891 Unspecified atrial fibrillation: Secondary | ICD-10-CM | POA: Diagnosis not present

## 2022-10-03 DIAGNOSIS — D696 Thrombocytopenia, unspecified: Secondary | ICD-10-CM | POA: Diagnosis not present

## 2022-10-03 DIAGNOSIS — F17220 Nicotine dependence, chewing tobacco, uncomplicated: Secondary | ICD-10-CM

## 2022-10-03 DIAGNOSIS — I8500 Esophageal varices without bleeding: Secondary | ICD-10-CM | POA: Diagnosis not present

## 2022-10-03 DIAGNOSIS — D509 Iron deficiency anemia, unspecified: Secondary | ICD-10-CM | POA: Diagnosis not present

## 2022-10-03 DIAGNOSIS — I480 Paroxysmal atrial fibrillation: Secondary | ICD-10-CM | POA: Diagnosis not present

## 2022-10-03 DIAGNOSIS — D519 Vitamin B12 deficiency anemia, unspecified: Secondary | ICD-10-CM | POA: Diagnosis not present

## 2022-10-03 DIAGNOSIS — E039 Hypothyroidism, unspecified: Secondary | ICD-10-CM | POA: Diagnosis not present

## 2022-10-03 DIAGNOSIS — K635 Polyp of colon: Secondary | ICD-10-CM | POA: Diagnosis not present

## 2022-10-03 DIAGNOSIS — K5790 Diverticulosis of intestine, part unspecified, without perforation or abscess without bleeding: Secondary | ICD-10-CM | POA: Diagnosis not present

## 2022-10-03 DIAGNOSIS — K922 Gastrointestinal hemorrhage, unspecified: Secondary | ICD-10-CM | POA: Diagnosis not present

## 2022-10-03 DIAGNOSIS — R69 Illness, unspecified: Secondary | ICD-10-CM | POA: Diagnosis not present

## 2022-10-03 HISTORY — PX: COLONOSCOPY WITH PROPOFOL: SHX5780

## 2022-10-03 HISTORY — PX: HEMOSTASIS CLIP PLACEMENT: SHX6857

## 2022-10-03 HISTORY — PX: POLYPECTOMY: SHX5525

## 2022-10-03 HISTORY — PX: ESOPHAGOGASTRODUODENOSCOPY (EGD) WITH PROPOFOL: SHX5813

## 2022-10-03 LAB — BASIC METABOLIC PANEL
Anion gap: 6 (ref 5–15)
BUN: 14 mg/dL (ref 8–23)
CO2: 23 mmol/L (ref 22–32)
Calcium: 7.9 mg/dL — ABNORMAL LOW (ref 8.9–10.3)
Chloride: 107 mmol/L (ref 98–111)
Creatinine, Ser: 0.8 mg/dL (ref 0.61–1.24)
GFR, Estimated: >60 mL/min (ref >60)
Glucose, Bld: 107 mg/dL — ABNORMAL HIGH (ref 70–99)
Potassium: 3.6 mmol/L (ref 3.5–5.1)
Sodium: 136 mmol/L (ref 135–145)

## 2022-10-03 LAB — BPAM RBC
Blood Product Expiration Date: 202404182359
Blood Product Expiration Date: 202404182359
Blood Product Expiration Date: 202404202359
ISSUE DATE / TIME: 202403271233
ISSUE DATE / TIME: 202403271456
ISSUE DATE / TIME: 202403272054
Unit Type and Rh: 6200
Unit Type and Rh: 6200
Unit Type and Rh: 6200

## 2022-10-03 LAB — CBC
HCT: 24.9 % — ABNORMAL LOW (ref 39.0–52.0)
HCT: 25.1 % — ABNORMAL LOW (ref 39.0–52.0)
Hemoglobin: 7.2 g/dL — ABNORMAL LOW (ref 13.0–17.0)
Hemoglobin: 7.3 g/dL — ABNORMAL LOW (ref 13.0–17.0)
MCH: 22.7 pg — ABNORMAL LOW (ref 26.0–34.0)
MCH: 22.8 pg — ABNORMAL LOW (ref 26.0–34.0)
MCHC: 28.9 g/dL — ABNORMAL LOW (ref 30.0–36.0)
MCHC: 29.1 g/dL — ABNORMAL LOW (ref 30.0–36.0)
MCV: 78.5 fL — ABNORMAL LOW (ref 80.0–100.0)
MCV: 78.4 fL — ABNORMAL LOW (ref 80.0–100.0)
Platelets: 86 10*3/uL — ABNORMAL LOW (ref 150–400)
Platelets: 87 10*3/uL — ABNORMAL LOW (ref 150–400)
RBC: 3.17 MIL/uL — ABNORMAL LOW (ref 4.22–5.81)
RBC: 3.2 MIL/uL — ABNORMAL LOW (ref 4.22–5.81)
RDW: 16.8 % — ABNORMAL HIGH (ref 11.5–15.5)
RDW: 16.8 % — ABNORMAL HIGH (ref 11.5–15.5)
WBC: 4 10*3/uL (ref 4.0–10.5)
WBC: 3.1 10*3/uL — ABNORMAL LOW (ref 4.0–10.5)
nRBC: 0 % (ref 0.0–0.2)
nRBC: 0 % (ref 0.0–0.2)

## 2022-10-03 LAB — FERRITIN: Ferritin: 9 ng/mL — ABNORMAL LOW (ref 24–336)

## 2022-10-03 LAB — TYPE AND SCREEN
ABO/RH(D): AB POS
Antibody Screen: NEGATIVE
Unit division: 0
Unit division: 0
Unit division: 0

## 2022-10-03 LAB — IRON AND TIBC
Iron: 17 ug/dL — ABNORMAL LOW (ref 45–182)
Saturation Ratios: 4 % — ABNORMAL LOW (ref 17.9–39.5)
TIBC: 456 ug/dL — ABNORMAL HIGH (ref 250–450)
UIBC: 439 ug/dL

## 2022-10-03 LAB — HEPATITIS B SURFACE ANTIBODY,QUALITATIVE: Hep B S Ab: NONREACTIVE

## 2022-10-03 LAB — HEPATITIS B SURFACE ANTIGEN: Hepatitis B Surface Ag: NONREACTIVE

## 2022-10-03 SURGERY — ESOPHAGOGASTRODUODENOSCOPY (EGD) WITH PROPOFOL
Anesthesia: Monitor Anesthesia Care

## 2022-10-03 SURGERY — COLONOSCOPY WITH PROPOFOL
Anesthesia: Monitor Anesthesia Care

## 2022-10-03 MED ORDER — PROPOFOL 500 MG/50ML IV EMUL
INTRAVENOUS | Status: DC | PRN
  Administered 2022-10-03: 125 ug/kg/min via INTRAVENOUS

## 2022-10-03 MED ORDER — PROPOFOL 10 MG/ML IV BOLUS
INTRAVENOUS | Status: DC | PRN
  Administered 2022-10-03: 30 mg via INTRAVENOUS
  Administered 2022-10-03: 20 mg via INTRAVENOUS
  Administered 2022-10-03: 30 mg via INTRAVENOUS

## 2022-10-03 MED ORDER — LIDOCAINE 2% (20 MG/ML) 5 ML SYRINGE
INTRAMUSCULAR | Status: DC | PRN
  Administered 2022-10-03: 100 mg via INTRAVENOUS

## 2022-10-03 MED ORDER — PROPOFOL 500 MG/50ML IV EMUL
INTRAVENOUS | Status: AC
  Filled 2022-10-03: qty 100

## 2022-10-03 MED ORDER — ALLOPURINOL 300 MG PO TABS
300.0000 mg | ORAL_TABLET | Freq: Every day | ORAL | Status: DC
  Administered 2022-10-04: 300 mg via ORAL
  Filled 2022-10-03: qty 1

## 2022-10-03 MED ORDER — LACTATED RINGERS IV SOLN
INTRAVENOUS | Status: AC | PRN
  Administered 2022-10-03: 1000 mL via INTRAVENOUS

## 2022-10-03 MED ORDER — PHENYLEPHRINE 80 MCG/ML (10ML) SYRINGE FOR IV PUSH (FOR BLOOD PRESSURE SUPPORT)
PREFILLED_SYRINGE | INTRAVENOUS | Status: DC | PRN
  Administered 2022-10-03: 80 ug via INTRAVENOUS

## 2022-10-03 MED ORDER — IOHEXOL 9 MG/ML PO SOLN
500.0000 mL | ORAL | Status: AC
  Administered 2022-10-03 (×2): 500 mL via ORAL

## 2022-10-03 SURGICAL SUPPLY — 25 items

## 2022-10-03 NOTE — Transfer of Care (Signed)
Immediate Anesthesia Transfer of Care Note  Patient: Joel Ford  Procedure(s) Performed: ESOPHAGOGASTRODUODENOSCOPY (EGD) WITH PROPOFOL COLONOSCOPY WITH PROPOFOL POLYPECTOMY HEMOSTASIS CLIP PLACEMENT  Patient Location: PACU and Endoscopy Unit  Anesthesia Type:MAC  Level of Consciousness: drowsy  Airway & Oxygen Therapy: Patient Spontanous Breathing and Patient connected to face mask oxygen  Post-op Assessment: Report given to RN and Post -op Vital signs reviewed and stable  Post vital signs: Reviewed and stable  Last Vitals:  Vitals Value Taken Time  BP    Temp    Pulse    Resp    SpO2 99% 10/03/22 1420    Last Pain:  Vitals:   10/03/22 1253  TempSrc: Temporal  PainSc: 0-No pain         Complications: No notable events documented.

## 2022-10-03 NOTE — Progress Notes (Signed)
  Progress Note   Patient: Joel Ford [id number] DOB: 1951/03/26 DOA: 10/02/2022     0 DOS: the patient was seen and examined on 10/03/2022   Brief hospital course: 72 year old male PMH including atrial fibrillation on rivaroxaban, constipation, hemorrhoids, seen by PCP yesterday 3/26 for constipation and shortness of breath hemoglobin was 6 and so patient was sent to the emergency department. Presented 3/27, hemoglobin confirmed at 5.6 and referred for admission for further evaluation of suspected GI bleed.   Assessment and Plan: Severe symptomatic microcytic anemia, suspect acute on chronic GI blood loss, GI bleed Microcytic anemia, fecal occult blood positive Vitamin B12 deficiency In the context of anticoagulation.  Transfused total 3 units PRBC, trend CBC, currently stable.  No current active bleeding.  Hemodynamics stable. PPI Continue Vitamin B12 supplementation EGD and colonoscopy planned   Paroxysmal atrial fibrillation, acquired thrombophilia Currently in sinus rhythm.  Holding anticoagulation.   Thrombocytopenia Chronic to 2018, stable, follow-up as an outpatient   Gout Allopurinol  Constipation, unspecified constipation type BM x8    Elevated TSH, hypothyroidism Follow-up as an outpatient.   Random hyperglycemia Follow-up as an outpatient.       Subjective:  Feels fine No bleeding seen No pain  Physical Exam: Vitals:   10/02/22 2112 10/02/22 2128 10/02/22 2353 10/03/22 0738  BP: 132/74 118/67 101/67 121/66  Pulse: 81 78 75 76  Resp: 18 18 16 18   Temp: 97.7 F (36.5 C) 98.2 F (36.8 C) 98 F (36.7 C)   TempSrc: Oral Oral Oral   SpO2: (!) 88% 100% 96% 99%  Weight:      Height:       Physical Exam Vitals reviewed.  Constitutional:      General: He is not in acute distress.    Appearance: He is not ill-appearing or toxic-appearing.  Cardiovascular:     Rate and Rhythm: Normal rate and regular rhythm.     Heart sounds: No murmur  heard. Pulmonary:     Effort: Pulmonary effort is normal. No respiratory distress.     Breath sounds: No wheezing, rhonchi or rales.  Abdominal:     Palpations: Abdomen is soft.     Tenderness: There is abdominal tenderness.  Neurological:     Mental Status: He is alert.  Psychiatric:        Mood and Affect: Mood normal.        Behavior: Behavior normal.     Data Reviewed: BMP noted Hgb 5.6 > 6.6 > 7.2 s/p 3 units PRBC; repeat stable at 7.3  Family Communication: wife at bedside  Disposition: Status is: Observation   Planned Discharge Destination: Home    Time spent: 20 minutes  Author: Murray Hodgkins, MD 10/03/2022 8:47 AM  For on call review [email or web address].

## 2022-10-03 NOTE — Anesthesia Preprocedure Evaluation (Signed)
Anesthesia Evaluation  Patient identified by MRN, date of birth, ID band Patient awake    Reviewed: Allergy & Precautions, NPO status , Patient's Chart, lab work & pertinent test results  Airway Mallampati: II  TM Distance: >3 FB Neck ROM: Full    Dental no notable dental hx.    Pulmonary neg pulmonary ROS   Pulmonary exam normal        Cardiovascular hypertension, Pt. on medications and Pt. on home beta blockers + dysrhythmias Atrial Fibrillation  Rhythm:Irregular Rate:Normal     Neuro/Psych negative neurological ROS  negative psych ROS   GI/Hepatic Neg liver ROS,GERD  Medicated,,  Endo/Other  Hypothyroidism    Renal/GU   negative genitourinary   Musculoskeletal  (+) Arthritis , Osteoarthritis,    Abdominal Normal abdominal exam  (+)   Peds  Hematology  (+) Blood dyscrasia, anemia   Anesthesia Other Findings   Reproductive/Obstetrics                             Anesthesia Physical Anesthesia Plan  ASA: 3  Anesthesia Plan: MAC   Post-op Pain Management:    Induction: Intravenous  PONV Risk Score and Plan: 1 and Propofol infusion and Treatment may vary due to age or medical condition  Airway Management Planned: Simple Face Mask and Nasal Cannula  Additional Equipment: None  Intra-op Plan:   Post-operative Plan:   Informed Consent: I have reviewed the patients History and Physical, chart, labs and discussed the procedure including the risks, benefits and alternatives for the proposed anesthesia with the patient or authorized representative who has indicated his/her understanding and acceptance.     Dental advisory given  Plan Discussed with: CRNA  Anesthesia Plan Comments:        Anesthesia Quick Evaluation

## 2022-10-03 NOTE — Op Note (Signed)
[facility] Patient Name: Joel Ford Procedure Date: 10/03/2022 MRN: [id number] Attending MD: Carol Ada , MD, [id number] Date of Birth: [birth year] CSN: [id number] Age: 72 Admit Type: Inpatient Procedure:                Upper GI endoscopy Indications:              Iron deficiency anemia Providers:                Carol Ada, MD, Carlyn Reichert, RN, Fransico Setters                            Mbumina, Technician Referring MD:              Medicines:                Propofol per Anesthesia Complications:            No immediate complications. Estimated Blood Loss:     Estimated blood loss: none. Procedure:                Pre-Anesthesia Assessment:                           - Prior to the procedure, a History and Physical                            was performed, and patient medications and                            allergies were reviewed. The patient's tolerance of                            previous anesthesia was also reviewed. The risks                            and benefits of the procedure and the sedation                            options and risks were discussed with the patient.                            All questions were answered, and informed consent                            was obtained. Prior Anticoagulants: The patient has                            taken no anticoagulant or antiplatelet agents. ASA                            Grade Assessment: III - A patient with severe                            systemic disease. After reviewing the risks and  benefits, the patient was deemed in satisfactory                            condition to undergo the procedure.                           - Sedation was administered by an anesthesia                            professional. Deep sedation was attained.                           After obtaining informed consent, the endoscope was                            passed under direct vision.  Throughout the                            procedure, the patient's blood pressure, pulse, and                            oxygen saturations were monitored continuously. The                            [id number] Olympus endoscope was introduced                            through the mouth, and advanced to the second part                            of duodenum. The upper GI endoscopy was                            accomplished without difficulty. The patient                            tolerated the procedure well. Scope In: Scope Out: Findings:      Large (> 5 mm) varices were found in the middle third of the esophagus       and in the lower third of the esophagus.      Multiple 10 mm pedunculated and sessile polyps with bleeding and       stigmata of recent bleeding were found in the gastric fundus and in the       gastric body. The polyp was removed with a hot snare. Resection and       retrieval were complete. To prevent bleeding post-intervention, two       hemostatic clips were successfully placed (MR safe). Clip manufacturer:       [employer]. There was no bleeding at the end of the procedure.      Mild portal hypertensive gastropathy was found in the entire examined       stomach.      The examined duodenum was normal.      Two polyps with stigmata of bleeding were identified in the gastric       lumen. The larger peduculated polyp  was friable. Both polyps were       removed with a hot snare and two hemoclips were secured to minimize       bleeding. The polyps appeared to be inflammatory in nature. Impression:               - Large (> 5 mm) esophageal varices.                           - Multiple gastric polyps. Resected and retrieved.                            Clip manufacturer: [employer]. Clips (MR                            safe) were placed.                           - Portal hypertensive gastropathy.                           - Normal examined  duodenum. Moderate Sedation:      Not Applicable - Patient had care per Anesthesia. Recommendation:           - Patient has a contact number available for                            emergencies. The signs and symptoms of potential                            delayed complications were discussed with the                            patient. Return to normal activities tomorrow.                            Written discharge instructions were provided to the                            patient.                           - Resume previous diet.                           - Continue present medications.                           - Await pathology results.                           - CT scan of the ABD/Pelvis.                           - Check HBV, HCV, alpha-1-antitrypsin, AMA, ASMA,                            ANA, iron panel, ferritin. Procedure  Code(s):        --- Professional ---                           [phone], Esophagogastroduodenoscopy, flexible,                            transoral; with removal of tumor(s), polyp(s), or                            other lesion(s) by snare technique Diagnosis Code(s):        --- Professional ---                           I85.00, Esophageal varices without bleeding                           K31.7, Polyp of stomach and duodenum                           K76.6, Portal hypertension                           K31.89, Other diseases of stomach and duodenum                           D50.9, Iron deficiency anemia, unspecified CPT copyright 2022 American Medical Association. All rights reserved. The codes documented in this report are preliminary and upon coder review may  be revised to meet current compliance requirements. Carol Ada, MD Carol Ada, MD 10/03/2022 2:39:15 PM This report has been signed electronically. Number of Addenda: 0

## 2022-10-03 NOTE — Progress Notes (Signed)
Mobility Specialist - Progress Note   10/03/22 1110  Mobility  Activity Ambulated with assistance in hallway  Level of Assistance Independent after set-up  Assistive Device Other (Comment) (IV Pole)  Distance Ambulated (ft) 500 ft  Activity Response Tolerated well  Mobility Referral Yes  $Mobility charge 1 Mobility   Pt received in recliner and agreeable to mobility. No complaints during session. Pt to recliner after session with all needs met.    [facility]

## 2022-10-03 NOTE — Anesthesia Postprocedure Evaluation (Signed)
Anesthesia Post Note  Patient: Joel Ford [facility]  Procedure(s) Performed: ESOPHAGOGASTRODUODENOSCOPY (EGD) WITH PROPOFOL COLONOSCOPY WITH PROPOFOL POLYPECTOMY HEMOSTASIS CLIP PLACEMENT     Patient location during evaluation: PACU Anesthesia Type: MAC Level of consciousness: awake and alert Pain management: pain level controlled Vital Signs Assessment: post-procedure vital signs reviewed and stable Respiratory status: spontaneous breathing, nonlabored ventilation, respiratory function stable and patient connected to nasal cannula oxygen Cardiovascular status: stable and blood pressure returned to baseline Postop Assessment: no apparent nausea or vomiting Anesthetic complications: no   No notable events documented.  Last Vitals:  Vitals:   10/03/22 1450 10/03/22 1500  BP:  (!) 130/92  Pulse: 81 82  Resp: 19 20  Temp:    SpO2: 98% 100%    Last Pain:  Vitals:   10/03/22 1500  TempSrc:   PainSc: 0-No pain                 Belenda Cruise P Aaisha Sliter

## 2022-10-03 NOTE — Op Note (Signed)
[facility] Patient Name: Joel Ford Procedure Date: 10/03/2022 MRN: [id number] Attending MD: Carol Ada , MD, [id number] Date of Birth: [birth year] CSN: [id number] Age: 72 Admit Type: Inpatient Procedure:                Colonoscopy Indications:              Iron deficiency anemia Providers:                Carol Ada, MD, Carlyn Reichert, RN, Janee Morn, Technician, Christell Faith, CRNA Referring MD:              Medicines:                Propofol per Anesthesia Complications:            No immediate complications. Estimated Blood Loss:     Estimated blood loss: none. Procedure:                Pre-Anesthesia Assessment:                           - Prior to the procedure, a History and Physical                            was performed, and patient medications and                            allergies were reviewed. The patient's tolerance of                            previous anesthesia was also reviewed. The risks                            and benefits of the procedure and the sedation                            options and risks were discussed with the patient.                            All questions were answered, and informed consent                            was obtained. Prior Anticoagulants: The patient has                            taken no anticoagulant or antiplatelet agents. ASA                            Grade Assessment: III - A patient with severe                            systemic disease. After reviewing the risks and  benefits, the patient was deemed in satisfactory                            condition to undergo the procedure.                           - Sedation was administered by an anesthesia                            professional. Deep sedation was attained.                           After obtaining informed consent, the colonoscope                            was passed under direct  vision. Throughout the                            procedure, the patient's blood pressure, pulse, and                            oxygen saturations were monitored continuously. The                            [id number] ([id number]) Olympus colonoscope was                            introduced through the anus and advanced to the the                            cecum, identified by appendiceal orifice and                            ileocecal valve. The colonoscopy was somewhat                            difficult due to significant looping. Successful                            completion of the procedure was aided by using                            manual pressure and straightening and shortening                            the scope to obtain bowel loop reduction. The                            patient tolerated the procedure well. The quality                            of the bowel preparation was evaluated using the  BBPS [facility] Bowel Preparation Scale) with scores                            of: Right Colon = 2 (minor amount of residual                            staining, small fragments of stool and/or opaque                            liquid, but mucosa seen well), Transverse Colon = 2                            (minor amount of residual staining, small fragments                            of stool and/or opaque liquid, but mucosa seen                            well) and Left Colon = 2 (minor amount of residual                            staining, small fragments of stool and/or opaque                            liquid, but mucosa seen well). The total BBPS score                            equals 6. Adequate. The ileocecal valve,                            appendiceal orifice, and rectum were photographed. Scope In: 1:57:15 PM Scope Out: 2:16:25 PM Scope Withdrawal Time: 0 hours 13 minutes 16 seconds  Total Procedure Duration: 0 hours 19 minutes 10 seconds   Findings:      A 3 mm polyp was found in the rectum. The polyp was sessile. The polyp       was removed with a cold snare. Resection and retrieval were complete.      Scattered large-mouthed diverticula were found in the sigmoid colon and       descending colon.      The prep was adequate, but small polyps can be missed. Impression:               - One 3 mm polyp in the rectum, removed with a cold                            snare. Resected and retrieved.                           - Diverticulosis in the sigmoid colon and in the                            descending colon. Moderate Sedation:      Not Applicable - Patient had care per Anesthesia. Recommendation:           -  Return patient to hospital ward for ongoing care.                           - Resume regular diet.                           - Continue present medications.                           - Await pathology results.                           - Repeat colonoscopy in 1 year for surveillance. Procedure Code(s):        --- Professional ---                           [phone], Colonoscopy, flexible; with removal of                            tumor(s), polyp(s), or other lesion(s) by snare                            technique Diagnosis Code(s):        --- Professional ---                           D12.8, Benign neoplasm of rectum                           D50.9, Iron deficiency anemia, unspecified                           K57.30, Diverticulosis of large intestine without                            perforation or abscess without bleeding CPT copyright 2022 American Medical Association. All rights reserved. The codes documented in this report are preliminary and upon coder review may  be revised to meet current compliance requirements. Carol Ada, MD Carol Ada, MD 10/03/2022 2:52:35 PM This report has been signed electronically. Number of Addenda: 0

## 2022-10-03 NOTE — Interval H&P Note (Signed)
History and Physical Interval Note:  10/03/2022 1:29 PM  Joel Ford [facility]  has presented today for surgery, with the diagnosis of melena.  The various methods of treatment have been discussed with the patient and family. After consideration of risks, benefits and other options for treatment, the patient has consented to  Procedure(s): ESOPHAGOGASTRODUODENOSCOPY (EGD) WITH PROPOFOL (N/A) COLONOSCOPY WITH PROPOFOL (N/A) as a surgical intervention.  The patient's history has been reviewed, patient examined, no change in status, stable for surgery.  I have reviewed the patient's chart and labs.  Questions were answered to the patient's satisfaction.     [name] D

## 2022-10-03 NOTE — Anesthesia Procedure Notes (Signed)
Procedure Name: MAC Date/Time: 10/03/2022 1:39 PM  Performed by: West Pugh, CRNAPre-anesthesia Checklist: Patient identified, Emergency Drugs available, Suction available, Patient being monitored and Timeout performed Patient Re-evaluated:Patient Re-evaluated prior to induction Oxygen Delivery Method: Simple face mask Preoxygenation: Pre-oxygenation with 100% oxygen Placement Confirmation: positive ETCO2 Dental Injury: Teeth and Oropharynx as per pre-operative assessment

## 2022-10-04 ENCOUNTER — Observation Stay (HOSPITAL_COMMUNITY): Payer: Medicare HMO

## 2022-10-04 ENCOUNTER — Ambulatory Visit (HOSPITAL_BASED_OUTPATIENT_CLINIC_OR_DEPARTMENT_OTHER): Payer: Medicare HMO | Admitting: Family

## 2022-10-04 ENCOUNTER — Encounter (HOSPITAL_BASED_OUTPATIENT_CLINIC_OR_DEPARTMENT_OTHER): Payer: Self-pay

## 2022-10-04 ENCOUNTER — Encounter (HOSPITAL_COMMUNITY): Payer: Self-pay | Admitting: Family Medicine

## 2022-10-04 DIAGNOSIS — K766 Portal hypertension: Secondary | ICD-10-CM | POA: Diagnosis not present

## 2022-10-04 DIAGNOSIS — E039 Hypothyroidism, unspecified: Secondary | ICD-10-CM | POA: Diagnosis not present

## 2022-10-04 DIAGNOSIS — D696 Thrombocytopenia, unspecified: Secondary | ICD-10-CM | POA: Diagnosis not present

## 2022-10-04 DIAGNOSIS — D509 Iron deficiency anemia, unspecified: Secondary | ICD-10-CM | POA: Diagnosis not present

## 2022-10-04 DIAGNOSIS — I480 Paroxysmal atrial fibrillation: Secondary | ICD-10-CM | POA: Diagnosis not present

## 2022-10-04 DIAGNOSIS — K922 Gastrointestinal hemorrhage, unspecified: Secondary | ICD-10-CM | POA: Diagnosis not present

## 2022-10-04 LAB — CBC
HCT: 26.2 % — ABNORMAL LOW (ref 39.0–52.0)
Hemoglobin: 7.5 g/dL — ABNORMAL LOW (ref 13.0–17.0)
MCH: 22.4 pg — ABNORMAL LOW (ref 26.0–34.0)
MCHC: 28.6 g/dL — ABNORMAL LOW (ref 30.0–36.0)
MCV: 78.2 fL — ABNORMAL LOW (ref 80.0–100.0)
Platelets: 91 10*3/uL — ABNORMAL LOW (ref 150–400)
RBC: 3.35 MIL/uL — ABNORMAL LOW (ref 4.22–5.81)
RDW: 17.3 % — ABNORMAL HIGH (ref 11.5–15.5)
WBC: 3.9 10*3/uL — ABNORMAL LOW (ref 4.0–10.5)
nRBC: 0 % (ref 0.0–0.2)

## 2022-10-04 LAB — MITOCHONDRIAL ANTIBODIES: Mitochondrial M2 Ab, IgG: <20 Units (ref 0.0–20.0)

## 2022-10-04 LAB — HCV AB W REFLEX TO QUANT PCR: HCV Ab: NONREACTIVE

## 2022-10-04 LAB — ANTI-SMOOTH MUSCLE ANTIBODY, IGG: F-Actin IgG: 3 Units (ref 0–19)

## 2022-10-04 LAB — HCV INTERPRETATION

## 2022-10-04 MED ORDER — VITAMIN B-12 1000 MCG PO TABS: 1000.0000 ug | ORAL_TABLET | Freq: Every day | ORAL | Status: AC

## 2022-10-04 NOTE — TOC Benefit Eligibility Note (Signed)
Transition of Care [facility]) Benefit Eligibility Note    Patient Details  Name: Alphonsa Neet MRN: [id number] Date of Birth: 06-20-1951   Medication/Dose: NA  Covered?: Yes  Tier: Other     Spoke with Person/Company/Phone Number:: Leslie/ [employer] [phone]  Co-Pay: $120.00  Prior Approval: No  Deductible: Met       Kerin Salen Phone Number: 10/04/2022, 9:27 AM

## 2022-10-04 NOTE — Progress Notes (Signed)
[facility] Progress Note  SUBJECTIVE:   Interval history: Joel Ford was seen and evaluated today at bedside.  He was resting in chair at bedside.  Denied chest pain.  Denied shortness of breath.  Noted that he had a dark bowel movement overnight.  No nausea or vomiting.  No abdominal pain.  He did not complete CT imaging yesterday as he was not sure that it would be covered by his insurance.  Labs have since shown iron deficiency anemia.  Viral hepatitis serology has been unremarkable.  Other serologies for chronic liver disease are pending.  Pathology from gastric polypectomies are pending.  Past Medical History:  Diagnosis Date   ABNORMAL ELECTROCARDIOGRAM 07/04/2009   Qualifier: Diagnosis of  By: Maxie Better FNP, Rosalita Levan    Atrial fibrillation ([place])    Gout    HYPERGLYCEMIA 07/27/2009   Qualifier: Diagnosis of  By: Council Mechanic MD, Hilaria Ota    HYPERLIPIDEMIA 07/04/2009   Qualifier: Diagnosis of  By: Maxie Better FNP, [name]    Left knee DJD    OA   RENAL CALCULUS 02/15/2008   Qualifier: Diagnosis of  By: Maxie Better FNP, Rosalita Levan    S/P total knee replacement 08/07/2009   right   Thyroid disease    Past Surgical History:  Procedure Laterality Date   APPENDECTOMY     age 55   CARDIAC CATHETERIZATION  01/23/2005   Normal   FINGER SURGERY     RIGHT INDEX, REATTACHED   orthoscopy     Right and left knee   TOTAL KNEE ARTHROPLASTY  2011   Right   TOTAL KNEE ARTHROPLASTY Left 09/21/2012   Dr Noemi Chapel   TOTAL KNEE ARTHROPLASTY Left 09/21/2012   Procedure: TOTAL KNEE ARTHROPLASTY;  Surgeon: Lorn Junes, MD;  Location: [place];  Service: Orthopedics;  Laterality: Left;   Current Facility-Administered Medications  Medication Dose Route Frequency Provider Last Rate Last Admin   0.9 %  sodium chloride infusion   Intravenous Continuous Carol Ada, MD   Stopped at 10/04/22 1325   acetaminophen (TYLENOL) tablet 650 mg  650 mg Oral Q6H PRN Carol Ada, MD        Or   acetaminophen (TYLENOL) suppository 650 mg  650 mg Rectal Q6H PRN Carol Ada, MD       allopurinol (ZYLOPRIM) tablet 300 mg  300 mg Oral Daily Carol Ada, MD   300 mg at 10/04/22 1004   cyanocobalamin (VITAMIN B12) injection 1,000 mcg  1,000 mcg Intramuscular Daily Carol Ada, MD   1,000 mcg at 10/04/22 1004   metoprolol tartrate (LOPRESSOR) tablet 25 mg  25 mg Oral BID Carol Ada, MD   25 mg at 10/04/22 1005   ondansetron (ZOFRAN) tablet 4 mg  4 mg Oral Q6H PRN Carol Ada, MD       Or   ondansetron [facility]) injection 4 mg  4 mg Intravenous Q6H PRN Carol Ada, MD       pantoprozole (PROTONIX) 80 mg /NS 100 mL infusion  8 mg/hr Intravenous Continuous Carol Ada, MD   Stopped at 10/04/22 1325   polyethylene glycol (MIRALAX / GLYCOLAX) packet 17 g  17 g Oral BID Carol Ada, MD   17 g at 10/04/22 1005   senna (SENOKOT) tablet 8.6 mg  1 tablet Oral BID Carol Ada, MD   8.6 mg at 10/04/22 1005   sodium chloride flush (NS) 0.9 % injection 3 mL  3 mL Intravenous Q12H Carol Ada, MD   3 mL at  10/02/22 2122   Allergies as of 10/02/2022   (No Known Allergies)   Review of Systems:  Review of Systems  Respiratory:  Negative for shortness of breath.   Cardiovascular:  Negative for chest pain.  Gastrointestinal:  Negative for abdominal pain, blood in stool, constipation, diarrhea, melena, nausea and vomiting.    OBJECTIVE:   Temp:  [97.9 F (36.6 C)-98.7 F (37.1 C)] 98.7 F (37.1 C) (03/29 0426) Pulse Rate:  [74-96] 79 (03/29 0426) Resp:  [13-22] 18 (03/29 0426) BP: (101-131)/(52-92) 101/68 (03/29 0426) SpO2:  [97 %-100 %] 99 % (03/29 0426) Last BM Date : 10/04/22 Physical Exam Constitutional:      General: He is not in acute distress.    Appearance: He is not ill-appearing, toxic-appearing or diaphoretic.  Cardiovascular:     Rate and Rhythm: Normal rate and regular rhythm.  Pulmonary:     Effort: No respiratory distress.     Breath sounds: Normal  breath sounds.     Comments: No supplemental oxygen in place at time of exam. Abdominal:     General: Bowel sounds are normal. There is no distension.     Palpations: Abdomen is soft.     Tenderness: There is no abdominal tenderness. There is no guarding.  Musculoskeletal:     Right lower leg: No edema.     Left lower leg: No edema.  Skin:    General: Skin is warm and dry.  Neurological:     Mental Status: He is alert.     Labs: Recent Labs    10/03/22 0059 10/03/22 0951 10/04/22 0439  WBC 4.0 3.1* 3.9*  HGB 7.2* 7.3* 7.5*  HCT 24.9* 25.1* 26.2*  PLT 86* 87* 91*   BMET Recent Labs    10/02/22 0810 10/03/22 0059  NA 139 136  K 3.8 3.6  CL 108 107  CO2 24 23  GLUCOSE 140* 107*  BUN 20 14  CREATININE 0.77 0.80  CALCIUM 8.2* 7.9*   LFT Recent Labs    10/02/22 0810  PROT 6.0*  ALBUMIN 3.6  AST 15  ALT 11  ALKPHOS 66  BILITOT 0.7   PT/INR Recent Labs    10/02/22 0819  LABPROT 31.6*  INR 3.1*   Diagnostic imaging: No results found.  IMPRESSION: Iron deficiency anemia  -EGD and colonoscopy completed 10/03/2022 findings of large esophageal varices, multiple gastric polyps, mild portal hypertensive gastropathy, 3 mm rectal polyp, sigmoid and descending colon diverticulosis Portal hypertension, etiology undetermined  -Patient declined CT imaging of the abdomen  -Viral hepatitis serologies unremarkable  -No reported alcohol use  -Serologies for other etiology of liver disease are pending Atrial fibrillation  -Xarelto on hold Hyperlipidemia  PLAN: -Follow-up pathology from EGD and colonoscopy -Recommend follow-up H/H in the outpatient setting -Follow-up remaining liver serologies -Will require repeat endoscopy to evaluate esophageal varices, for possible band ligation, patient can follow-up with Dr. Carol Ada in the outpatient setting to coordinate this -Okay to resume Xarelto from GI standpoint -Appears stable for discharge from GI standpoint  today   LOS: 0 days   Danton Clap, [facility]

## 2022-10-04 NOTE — Discharge Summary (Signed)
Physician Discharge Summary   Patient: Joel Ford MRN: [id number] DOB: 22-Oct-1950  Admit date:     10/02/2022  Discharge date: 10/04/22  Discharge Physician: Murray Hodgkins   PCP: Donella Stade, PA-C   Recommendations at discharge:   Severe symptomatic microcytic anemia, suspect acute on chronic GI blood loss, GI bleed Microcytic anemia, fecal occult blood positive, iron deficiency anemia Vitamin B12 deficiency Per GI, await pathology results.  CT abdomen pelvis was ordered to the patient and he declined it and subsequently requested discharge with outpatient follow-up and outpatient CT.  This can be coordinated by PCP/GI. Hepatitis panel negative.  Follow-up alpha-1-antitrypsin, AMA, ASMA, ANA Patient declined to wait for IV iron infusion.  He can continue oral iron.  IV iron can be considered as an outpatient. Follow-up future B12 levels to assess response to repletion.  Paroxysmal atrial fibrillation, acquired thrombophilia Currently in sinus rhythm.  Can resume anticoagulation on discharge, monitor for bleeding.  Recommend follow-up with PCP next week and begin discussion around risk/benefit of long-term anticoagulation given new findings..   Thrombocytopenia Chronic to 2018, stable, follow-up as an outpatient   Elevated TSH, hypothyroidism Follow-up as an outpatient.   Random hyperglycemia Follow-up as an outpatient.  Discharge Diagnoses: Principal Problem:   GIB (gastrointestinal bleeding) Active Problems:   Gout   PAF (paroxysmal atrial fibrillation) (HCC)   B12 deficiency anemia   Hypothyroidism   Thrombocytopenia (HCC)   Anemia  Resolved Problems:   * No resolved hospital problems. [facility] Course: 72 year old male PMH including atrial fibrillation on rivaroxaban, constipation, hemorrhoids, seen by PCP yesterday 3/26 for constipation and shortness of breath hemoglobin was 6 and so patient was sent to the emergency department. Presented 3/27, hemoglobin  confirmed at 5.6 and referred for admission for further evaluation of suspected GI bleed.   Severe symptomatic microcytic anemia, suspect acute on chronic GI blood loss, GI bleed Microcytic anemia, fecal occult blood positive, iron deficiency anemia Vitamin B12 deficiency In the context of anticoagulation.  Transfused total 3 units PRBC. CBC post-transfusion has been stable.  No current active bleeding.  Hemodynamics stable. PPI Continue Vitamin B12 supplementation EGD below, showed esophageal varices and portal hypertensive gastropathy. Per GI, await pathology results.  CT abdomen pelvis was ordered to the patient and he declined it and subsequently requested discharge with outpatient follow-up and outpatient CT.  This can be coordinated by PCP/GI. Hepatitis panel negative.  Follow-up alpha-1-antitrypsin, AMA, ASMA, ANA Patient declined to wait for IV iron infusion.  He can continue oral iron.  IV iron can be considered as an outpatient. Colonoscopy One polyp in rectum, diverticulosis  Paroxysmal atrial fibrillation, acquired thrombophilia Currently in sinus rhythm.  Can resume anticoagulation on discharge, monitor for bleeding.  He needs to follow-up with PCP next week and begin discussion around risk/benefit of long-term anticoagulation given new findings..   Thrombocytopenia Chronic to 2018, stable, follow-up as an outpatient   Gout Allopurinol   Constipation, unspecified constipation type BM x8    Elevated TSH, hypothyroidism Follow-up as an outpatient.   Random hyperglycemia Follow-up as an outpatient.      Consultants:  GI  Procedures performed:  EGD showed large esophageal varices, gastric polys, portal hypertensive gastropathy  Impression:               - Large (> 5 mm) esophageal varices.                           -  Multiple gastric polyps. Resected and retrieved.                            Clip manufacturer: [employer]. Clips (MR                             safe) were placed.                           - Portal hypertensive gastropathy.                           - Normal examined duodenum. Moderate Sedation:      Not Applicable - Patient had care per Anesthesia. Recommendation:           - Patient has a contact number available for                            emergencies. The signs and symptoms of potential                            delayed complications were discussed with the                            patient. Return to normal activities tomorrow.                            Written discharge instructions were provided to the                            patient.                           - Resume previous diet.                           - Continue present medications.                           - Await pathology results.                           - CT scan of the ABD/Pelvis.                           - Check HBV, HCV, alpha-1-antitrypsin, AMA, ASMA,                            ANA, iron panel, ferritin.   Colonoscopy One polyp in rectum, diverticulosis  Disposition: Home Diet recommendation:  Regular diet DISCHARGE MEDICATION: Allergies as of 10/04/2022   No Known Allergies      Medication List     STOP taking these medications    gabapentin 100 MG capsule Commonly known as: NEURONTIN   hydrocortisone 2.5 % rectal cream Commonly known as: Anusol-HC   hydrocortisone 25 MG suppository Commonly known as: ANUSOL-HC       TAKE these medications    allopurinol 300  MG tablet Commonly known as: ZYLOPRIM Take 300 mg by mouth daily.   cyanocobalamin 1000 MCG tablet Commonly known as: VITAMIN B12 Take 1 tablet (1,000 mcg total) by mouth daily.   ferrous sulfate 325 (65 FE) MG EC tablet TAKE 1 TABLET (325 MG TOTAL) BY MOUTH DAILY WITH BREAKFAST. NEED LABS   lidocaine 5 % ointment Commonly known as: XYLOCAINE Apply 1 Application topically 3 (three) times daily as needed. What changed: reasons to take this   linaclotide  145 MCG Caps capsule Commonly known as: LINZESS Take 1 capsule (145 mcg total) by mouth daily.   metoprolol tartrate 25 MG tablet Commonly known as: LOPRESSOR Take 1 tablet (25 mg total) by mouth 2 (two) times daily.   omeprazole 40 MG capsule Commonly known as: PRILOSEC Take 1 capsule (40 mg total) by mouth daily. Needs appt   tiZANidine 4 MG tablet Commonly known as: Zanaflex Take 0.5 tablets (2 mg total) by mouth every 8 (eight) hours as needed for muscle spasms.   traZODone 50 MG tablet Commonly known as: DESYREL Take 0.5-1 tablets (25-50 mg total) by mouth at bedtime as needed for sleep.   Xarelto 20 MG Tabs tablet Generic drug: rivaroxaban TAKE 1 TABLET BY MOUTH DAILY WITH SUPPER        Follow-up Information     Breeback, Jade L, PA-C. Schedule an appointment as soon as possible for a visit in 1 week(s).   Specialty: Family Medicine Contact information: [address] [place] [address] [phone]         Carol Ada, MD. Schedule an appointment as soon as possible for a visit in 2 week(s).   Specialty: Gastroenterology Contact information: [place], [place] [address] [phone]                Feels good, wants to go home No bleeding noted  Discharge Exam: Filed Weights   10/02/22 0822  Weight: 85.3 kg   Physical Exam Vitals reviewed.  Constitutional:      General: He is not in acute distress.    Appearance: He is not ill-appearing or toxic-appearing.  Cardiovascular:     Rate and Rhythm: Normal rate and regular rhythm.     Heart sounds: No murmur heard. Pulmonary:     Effort: Pulmonary effort is normal. No respiratory distress.     Breath sounds: No wheezing, rhonchi or rales.  Neurological:     Mental Status: He is alert.  Psychiatric:        Mood and Affect: Mood normal.        Behavior: Behavior normal.    Condition at discharge: good  The results of significant diagnostics  from this hospitalization (including imaging, microbiology, ancillary and laboratory) are listed below for reference.   Imaging Studies: DG Chest Port 1 View  Result Date: 10/02/2022 CLINICAL DATA:  Shortness of breath EXAM: PORTABLE CHEST 1 VIEW COMPARISON:  08/05/2016 chest radiograph FINDINGS: No pleural effusion. No pneumothorax. Normal cardiac and mediastinal contours. No focal airspace opacity no radiographically apparent displaced rib fractures. Visualized upper abdomen is unremarkable. Degenerative changes of the right AC joint. IMPRESSION: No focal airspace opacity. Electronically Signed   By: Marin Roberts M.D.   On: 10/02/2022 08:53    Microbiology: Results for orders placed or performed during the hospital encounter of 09/14/12  Surgical pcr screen     Status: None   Collection Time: 09/14/12 12:26 PM   Specimen: Nasal Mucosa; Nasal Swab  Result Value Ref Range Status   MRSA, PCR NEGATIVE NEGATIVE Final   Staphylococcus aureus NEGATIVE NEGATIVE Final    Comment:        The Xpert SA Assay (FDA approved for NASAL specimens in patients over 19 years of age), is one component of a comprehensive surveillance program.  Test performance has been validated by [employer] for patients greater than or equal to 76 year old. It is not intended to diagnose infection nor to guide or monitor treatment.  Urine culture     Status: None   Collection Time: 09/14/12 12:27 PM   Specimen: Urine, Clean Catch  Result Value Ref Range Status   Specimen Description URINE, CLEAN CATCH  Final   Special Requests none  Final   Culture  Setup Time 09/14/2012 13:43  Final   Colony Count NO GROWTH  Final   Culture NO GROWTH  Final   Report Status 09/15/2012 FINAL  Final    Labs: CBC: Recent Labs  Lab 10/01/22 1032 10/02/22 0810 10/02/22 1844 10/03/22 0059 10/03/22 0951 10/04/22 0439  WBC 4.2 4.0 2.9* 4.0 3.1* 3.9*  NEUTROABS 3,150  --   --   --   --   --   HGB 6.0* 5.6* 6.6* 7.2* 7.3*  7.5*  HCT 21.9* 21.6* 23.2* 24.9* 25.1* 26.2*  MCV 73.7* 76.9* 77.6* 78.5* 78.4* 78.2*  PLT 128* 113* 91* 86* 87* 91*   Basic Metabolic Panel: Recent Labs  Lab 10/01/22 1032 10/02/22 0810 10/03/22 0059  NA 141 139 136  K 3.9 3.8 3.6  CL 107 108 107  CO2 25 24 23   GLUCOSE 219* 140* 107*  BUN 18 20 14   CREATININE 0.96 0.77 0.80  CALCIUM 8.5* 8.2* 7.9*   Liver Function Tests: Recent Labs  Lab 10/01/22 1032 10/02/22 0810  AST 12 15  ALT 7* 11  ALKPHOS  --  66  BILITOT 0.7 0.7  PROT 6.1 6.0*  ALBUMIN  --  3.6   CBG: No results for input(s): "GLUCAP" in the last 168 hours.  Discharge time spent: greater than 30 minutes.  Signed: Murray Hodgkins, MD [facility] 10/04/2022

## 2022-10-04 NOTE — Progress Notes (Signed)
CT scan covered In Patient has $120.00 copay

## 2022-10-04 NOTE — Progress Notes (Signed)
AVS and discharge instructions reviewed w/ patient and wife at the bedside. Both verbalized understanding and had no further questions.  

## 2022-10-04 NOTE — Plan of Care (Signed)

## 2022-10-04 NOTE — TOC Initial Note (Addendum)
Transition of Care [facility]) - Initial/Assessment Note    Patient Details  Name: Joel Ford MRN: [id number] Date of Birth: June 26, 1951  [facility]) CM/SW Contact:    Henrietta Dine, RN Phone Number: 10/04/2022, 12:52 PM  Clinical Narrative:                 [facility] consult d/t pt wanting verification insurance will pay for CT; spoke w/ pt; information given per benefits note; TOC signing off.  Medication/Dose: NA   Covered?: Yes   Tier: Other     Spoke with Person/Company/Phone Number:: [employer] [phone]   Co-Pay: $120.00   Prior Approval: No   Deductible: Met  He verbalized understanding and says he is not sure scan still needed; advised pt to discuss w/ ordering MD; TOC signing off.  Expected Discharge Plan: Home/Self Care Barriers to Discharge: No Barriers Identified   Patient Goals and CMS Choice Patient states their goals for this hospitalization and ongoing recovery are:: home          Expected Discharge Plan and Services   Discharge Planning Services: CM Consult Post Acute Care Choice: NA Living arrangements for the past 2 months: Single Family Home                                      Prior Living Arrangements/Services Living arrangements for the past 2 months: Single Family Home Lives with:: Spouse Patient language and need for interpreter reviewed:: Yes Do you feel safe going back to the place where you live?: Yes      Need for Family Participation in Patient Care: Yes (Comment)   Current home services: DME (cane, walker) Criminal Activity/Legal Involvement Pertinent to Current Situation/Hospitalization: No - Comment as needed  Activities of Daily Living Home Assistive Devices/Equipment: None ADL Screening (condition at time of admission) Patient's cognitive ability adequate to safely complete daily activities?: Yes Is the patient deaf or have difficulty hearing?: Yes Does the patient have difficulty  seeing, even when wearing glasses/contacts?: No Does the patient have difficulty concentrating, remembering, or making decisions?: No Patient able to express need for assistance with ADLs?: Yes Does the patient have difficulty dressing or bathing?: No Independently performs ADLs?: Yes (appropriate for developmental age) Does the patient have difficulty walking or climbing stairs?: No Weakness of Legs: None Weakness of Arms/Hands: None  Permission Sought/Granted Permission sought to share information with : Case Manager Permission granted to share information with : Yes, Verbal Permission Granted  Share Information with NAME: Lenor Coffin, RN     Permission granted to share info w Relationship: Cin Stutzman (wife) [phone]     Emotional Assessment Appearance:: Appears stated age Attitude/Demeanor/Rapport: Gracious Affect (typically observed): Accepting Orientation: : Oriented to Self, Oriented to Place, Oriented to  Time, Oriented to Situation Alcohol / Substance Use: Not Applicable Psych Involvement: No (comment)  Admission diagnosis:  Thrombocytopenia [D69.6] GIB (gastrointestinal bleeding) [K92.2] Upper GI bleed [K92.2] Elevated INR [R79.1] Symptomatic anemia [D64.9] On rivaroxaban therapy [Z79.01] Patient Active Problem List   Diagnosis Date Noted   GIB (gastrointestinal bleeding) 10/02/2022   Anemia 10/02/2022   Constipation [id number]   Umbilical hernia without obstruction and without gangrene 10/01/2022   Hemorrhoids 07/19/2022   Colon cancer screening 07/19/2022   Slow transit constipation 07/19/2022   Internal hemorrhoids 07/19/2022   Primary localized osteoarthrosis of multiple sites 07/18/2022   Folate deficiency 12/21/2021  DDD (degenerative disc disease), lumbar 12/21/2021   Leukopenia 12/19/2021   Normocytic anemia 12/19/2021   Thrombocytopenia ([place]) 12/19/2021   Acute left-sided low back pain without sciatica 12/19/2021   Tick bite 11/19/2018    Essential hypertension 03/03/2017   Health care maintenance 09/03/2016   New onset atrial fibrillation ([place]) 08/06/2016   B12 deficiency anemia 08/06/2016   Anemia, iron deficiency 08/06/2016   Hypothyroidism 08/06/2016   PAF (paroxysmal atrial fibrillation) ([place]) 08/05/2016   Gout    Left knee DJD    Preop cardiovascular exam 09/06/2012   Arthritis of knee, degenerative 08/30/2012   EXTERNAL HEMORRHOIDS 12/26/2009   S/P total knee replacement 08/07/2009   HYPERGLYCEMIA 07/27/2009   Hyperlipidemia 07/04/2009   ABNORMAL ELECTROCARDIOGRAM 07/04/2009   CHEST WALL PAIN, ACUTE 08/30/2008   RENAL CALCULUS 02/15/2008   Thoracic or lumbosacral neuritis or radiculitis 02/15/2008   GOUT 01/22/2008   PCP:  Donella Stade, PA-C Pharmacy:   [id number] [place] [address] [place] [address] Phone: [phone] Fax: [phone]     Social Determinants of Health (SDOH) Social History: SDOH Screenings   Food Insecurity: No Food Insecurity (10/02/2022)  Housing: Low Risk  (10/02/2022)  Transportation Needs: No Transportation Needs (10/04/2022)  Utilities: Not At Risk (10/04/2022)  Depression (PHQ2-9): Low Risk  (10/01/2022)  Tobacco Use: High Risk (10/03/2022)   SDOH Interventions: Transportation Interventions: Inpatient TOC Utilities Interventions: Inpatient TOC   Readmission Risk Interventions     No data to display

## 2022-10-04 NOTE — Progress Notes (Signed)
Mr Shockley refused his CT Scan. He wants confirmation his scan will be paid for by insurance because it has been denied before. CT personnel explained and stated to patient " I told him there is no way to check that tonight and that he would have to drink again if he waited til later today. He said he wants to wait even if he has to drink again."

## 2022-10-05 LAB — IGG: IgG (Immunoglobin G), Serum: 584 mg/dL — ABNORMAL LOW (ref 603–1613)

## 2022-10-05 LAB — ANA W/REFLEX IF POSITIVE: Anti Nuclear Antibody (ANA): NEGATIVE

## 2022-10-05 LAB — ALPHA-1-ANTITRYPSIN: A-1 Antitrypsin, Ser: 180 mg/dL (ref 101–187)

## 2022-10-07 ENCOUNTER — Encounter (HOSPITAL_COMMUNITY): Payer: Self-pay | Admitting: Gastroenterology

## 2022-10-07 LAB — SURGICAL PATHOLOGY

## 2022-10-14 ENCOUNTER — Encounter: Payer: Self-pay | Admitting: Physician Assistant

## 2022-10-14 ENCOUNTER — Ambulatory Visit (INDEPENDENT_AMBULATORY_CARE_PROVIDER_SITE_OTHER): Payer: Medicare HMO | Admitting: Physician Assistant

## 2022-10-14 VITALS — BP 135/71 | HR 79 | Ht 70.0 in | Wt 186.0 lb

## 2022-10-14 DIAGNOSIS — R7989 Other specified abnormal findings of blood chemistry: Secondary | ICD-10-CM | POA: Diagnosis not present

## 2022-10-14 DIAGNOSIS — R1030 Lower abdominal pain, unspecified: Secondary | ICD-10-CM | POA: Diagnosis not present

## 2022-10-14 DIAGNOSIS — K922 Gastrointestinal hemorrhage, unspecified: Secondary | ICD-10-CM

## 2022-10-14 DIAGNOSIS — D72819 Decreased white blood cell count, unspecified: Secondary | ICD-10-CM | POA: Diagnosis not present

## 2022-10-14 DIAGNOSIS — E538 Deficiency of other specified B group vitamins: Secondary | ICD-10-CM | POA: Diagnosis not present

## 2022-10-14 DIAGNOSIS — Z09 Encounter for follow-up examination after completed treatment for conditions other than malignant neoplasm: Secondary | ICD-10-CM | POA: Diagnosis not present

## 2022-10-14 DIAGNOSIS — R7309 Other abnormal glucose: Secondary | ICD-10-CM

## 2022-10-14 DIAGNOSIS — K5900 Constipation, unspecified: Secondary | ICD-10-CM

## 2022-10-14 DIAGNOSIS — I480 Paroxysmal atrial fibrillation: Secondary | ICD-10-CM | POA: Diagnosis not present

## 2022-10-14 DIAGNOSIS — D696 Thrombocytopenia, unspecified: Secondary | ICD-10-CM | POA: Diagnosis not present

## 2022-10-14 DIAGNOSIS — D649 Anemia, unspecified: Secondary | ICD-10-CM

## 2022-10-14 DIAGNOSIS — K2901 Acute gastritis with bleeding: Secondary | ICD-10-CM | POA: Diagnosis not present

## 2022-10-14 NOTE — Patient Instructions (Addendum)
Made referral to GI for follow up Will discuss with cardiology about xarelto continue for now Will get CT of abdomen Continue iron  Get labs today

## 2022-10-14 NOTE — Progress Notes (Unsigned)
Established Patient Office Visit  Subjective   Patient ID: Joel Ford, male    DOB: April 17, 1951  Age: 72 y.o. MRN: [id number]  Chief Complaint  Patient presents with   Hospitalization Follow-up    Pt is here to fup on ed visit  seen in ed for upper gi bleed     HPI  Pt is a 72 yo male who was admitted to hospital on 10/02/2022 for extremely low hemoglobin at below 6 and discharged 10/04/2022.   Recommendations at discharge:    Severe symptomatic microcytic anemia, suspect acute on chronic GI blood loss, GI bleed Microcytic anemia, fecal occult blood positive, iron deficiency anemia Vitamin B12 deficiency Per GI, await pathology results.  CT abdomen pelvis was ordered to the patient and he declined it and subsequently requested discharge with outpatient follow-up and outpatient CT.  This can be coordinated by PCP/GI. Hepatitis panel negative.  Follow-up alpha-1-antitrypsin, AMA, ASMA, ANA Patient declined to wait for IV iron infusion.  He can continue oral iron.  IV iron can be considered as an outpatient. Follow-up future B12 levels to assess response to repletion.   Paroxysmal atrial fibrillation, acquired thrombophilia Currently in sinus rhythm.  Can resume anticoagulation on discharge, monitor for bleeding.  Recommend follow-up with PCP next week and begin discussion around risk/benefit of long-term anticoagulation given new findings..   Thrombocytopenia Chronic to 2018, stable, follow-up as an outpatient   Elevated TSH, hypothyroidism Follow-up as an outpatient.   Random hyperglycemia Follow-up as an outpatient.  At discharge HgB was 7.3 and serum iron 17.  TSH 11.73  Glucose 219  Pt continues on oral iron and xarelto. He is feeling much better. He has more energy and no SOB. His constipation has improved some on linzess.  He does not have any follow ups scheduled.  Active Ambulatory Problems    Diagnosis Date Noted   Hyperlipidemia 07/04/2009   GOUT 01/22/2008    EXTERNAL HEMORRHOIDS 12/26/2009   RENAL CALCULUS 02/15/2008   Thoracic or lumbosacral neuritis or radiculitis 02/15/2008   CHEST WALL PAIN, ACUTE 08/30/2008   HYPERGLYCEMIA 07/27/2009   ABNORMAL ELECTROCARDIOGRAM 07/04/2009   Arthritis of knee, degenerative 08/30/2012   Preop cardiovascular exam 09/06/2012   Gout    Left knee DJD    S/P total knee replacement 08/07/2009   PAF (paroxysmal atrial fibrillation) 08/05/2016   New onset atrial fibrillation 08/06/2016   B12 deficiency anemia 08/06/2016   Anemia, iron deficiency 08/06/2016   Hypothyroidism 08/06/2016   Health care maintenance 09/03/2016   Essential hypertension 03/03/2017   Tick bite 11/19/2018   Leukopenia 12/19/2021   Normocytic anemia 12/19/2021   Thrombocytopenia 12/19/2021   Acute left-sided low back pain without sciatica 12/19/2021   Folate deficiency 12/21/2021   DDD (degenerative disc disease), lumbar 12/21/2021   Primary localized osteoarthrosis of multiple sites 07/18/2022   Hemorrhoids 07/19/2022   Colon cancer screening 07/19/2022   Slow transit constipation 07/19/2022   Internal hemorrhoids 07/19/2022   Constipation 10/01/2022   Umbilical hernia without obstruction and without gangrene 10/01/2022   GIB (gastrointestinal bleeding) 10/02/2022   Anemia 10/02/2022   Elevated random blood glucose level 10/14/2022   Elevated TSH 10/14/2022   B12 deficiency 10/14/2022   Resolved Ambulatory Problems    Diagnosis Date Noted   Cellulitis and abscess of upper arm and forearm 06/18/2007   HEEL PAIN, RIGHT 11/10/2006   Past Medical History:  Diagnosis Date   Atrial fibrillation    Esophageal varices    Gastric polyps  HYPERLIPIDEMIA 07/04/2009   Portal hypertensive gastropathy    Thyroid disease     ROS   See HPI.  Objective:     BP 135/71   Pulse 79   Ht 5\' 10"  (1.778 m)   Wt 186 lb (84.4 kg)   SpO2 99%   BMI 26.69 kg/m  BP Readings from Last 3 Encounters:  10/14/22 135/71  10/04/22  101/68  10/01/22 (!) 114/51   Wt Readings from Last 3 Encounters:  10/14/22 186 lb (84.4 kg)  10/02/22 188 lb (85.3 kg)  10/01/22 188 lb (85.3 kg)      Physical Exam Constitutional:      Appearance: Normal appearance.  HENT:     Head: Normocephalic.  Cardiovascular:     Rate and Rhythm: Normal rate and regular rhythm.     Heart sounds: Murmur heard.  Pulmonary:     Effort: Pulmonary effort is normal.     Breath sounds: Normal breath sounds.  Skin:    Comments: Color to skin  Neurological:     Mental Status: He is alert and oriented to person, place, and time.  Psychiatric:        Mood and Affect: Mood normal.          Assessment & Plan:  Marland KitchenMarland KitchenJaydel was seen today for hospitalization follow-up.  Diagnoses and all orders for this visit:  Hospital discharge follow-up  PAF (paroxysmal atrial fibrillation) -     Ambulatory referral to Gastroenterology -     CBC w/Diff/Platelet -     Fe+TIBC+Fer -     B12 and Folate Panel -     TSH -     Hemoglobin A1c  Upper GI bleed -     Ambulatory referral to Gastroenterology -     CBC w/Diff/Platelet -     Fe+TIBC+Fer -     B12 and Folate Panel -     TSH -     Hemoglobin A1c -     CT Abdomen Pelvis Wo Contrast; Future  Elevated random blood glucose level -     Hemoglobin A1c  B12 deficiency -     B12 and Folate Panel  Elevated TSH -     TSH  Constipation, unspecified constipation type -     CT Abdomen Pelvis Wo Contrast; Future  Lower abdominal pain -     CT Abdomen Pelvis Wo Contrast; Future  Normocytic anemia -     CT Abdomen Pelvis Wo Contrast; Future  Leukopenia, unspecified type -     CT Abdomen Pelvis Wo Contrast; Future  Thrombocytopenia -     CT Abdomen Pelvis Wo Contrast; Future   Will recheck labs today from hospital discharge to make sure hemoglobin is stable and/or improving Continue oral iron and linzess for constipation from oral iron Referral for Dr. Elnoria Howard made today for upper GI bleed  follow up Continue PPI.  Pt declined CT of abdomen in hospital but agrees to it outpatient, will order Will discuss with cardiology about xarelto and if should stop or continue. For now continue.  TSH to be recheck.  A1C to evaluate for DM.   Spent 40 minutes reviewing chart, coordinating care, and discussing plan with patient.      Return in about 3 months (around 01/13/2023).    Tandy Gaw, PA-C

## 2022-10-15 ENCOUNTER — Other Ambulatory Visit: Payer: Self-pay | Admitting: Physician Assistant

## 2022-10-15 ENCOUNTER — Encounter: Payer: Self-pay | Admitting: Physician Assistant

## 2022-10-15 LAB — IRON,TIBC AND FERRITIN PANEL
%SAT: 4 % (calc) — ABNORMAL LOW (ref 20–48)
Ferritin: 6 ng/mL — ABNORMAL LOW (ref 24–380)
Iron: 19 ug/dL — ABNORMAL LOW (ref 50–180)
TIBC: 477 mcg/dL (calc) — ABNORMAL HIGH (ref 250–425)

## 2022-10-15 LAB — CBC WITH DIFFERENTIAL/PLATELET
Absolute Monocytes: 320 cells/uL (ref 200–950)
Basophils Absolute: 31 cells/uL (ref 0–200)
Basophils Relative: 0.8 %
Eosinophils Absolute: 109 cells/uL (ref 15–500)
Eosinophils Relative: 2.8 %
HCT: 31.9 % — ABNORMAL LOW (ref 38.5–50.0)
Hemoglobin: 8.7 g/dL — ABNORMAL LOW (ref 13.2–17.1)
Lymphs Abs: 421 cells/uL — ABNORMAL LOW (ref 850–3900)
MCH: 20.8 pg — ABNORMAL LOW (ref 27.0–33.0)
MCHC: 27.3 g/dL — ABNORMAL LOW (ref 32.0–36.0)
MCV: 76.1 fL — ABNORMAL LOW (ref 80.0–100.0)
MPV: 12.7 fL — ABNORMAL HIGH (ref 7.5–12.5)
Monocytes Relative: 8.2 %
Neutro Abs: 3019 cells/uL (ref 1500–7800)
Neutrophils Relative %: 77.4 %
Platelets: 153 10*3/uL (ref 140–400)
RBC: 4.19 10*6/uL — ABNORMAL LOW (ref 4.20–5.80)
RDW: 17.9 % — ABNORMAL HIGH (ref 11.0–15.0)
Total Lymphocyte: 10.8 %
WBC: 3.9 10*3/uL (ref 3.8–10.8)

## 2022-10-15 LAB — B12 AND FOLATE PANEL
Folate: 10.3 ng/mL
Vitamin B-12: 642 pg/mL (ref 200–1100)

## 2022-10-15 LAB — HEMOGLOBIN A1C
Hgb A1c MFr Bld: 5.4 % of total Hgb (ref <5.7)
Mean Plasma Glucose: 108 mg/dL
eAG (mmol/L): 6 mmol/L

## 2022-10-15 LAB — TSH: TSH: 25.14 mIU/L — ABNORMAL HIGH (ref 0.40–4.50)

## 2022-10-15 MED ORDER — LEVOTHYROXINE SODIUM 50 MCG PO TABS: 50.0000 ug | ORAL_TABLET | Freq: Every day | ORAL | 1 refills | Status: DC

## 2022-10-15 NOTE — Progress Notes (Signed)
Joel Ford,   Hemoglobin continues to improve.  B12 looks much better! A1C is normal range. No diabetes.  TSH is elevated meaning hypothyroidism. We need to start thyroid supplementation. Lets start levothyroxine daily in the morning before breakfast or anything to eat. Do not take with other pills as well. Recheck labs in 6 weeks.

## 2022-10-22 ENCOUNTER — Encounter: Payer: Self-pay | Admitting: Physician Assistant

## 2022-10-26 ENCOUNTER — Other Ambulatory Visit: Payer: Self-pay | Admitting: Physician Assistant

## 2022-10-26 DIAGNOSIS — F5101 Primary insomnia: Secondary | ICD-10-CM

## 2022-10-28 ENCOUNTER — Ambulatory Visit (INDEPENDENT_AMBULATORY_CARE_PROVIDER_SITE_OTHER): Payer: Medicare HMO

## 2022-10-28 DIAGNOSIS — D696 Thrombocytopenia, unspecified: Secondary | ICD-10-CM

## 2022-10-28 DIAGNOSIS — K922 Gastrointestinal hemorrhage, unspecified: Secondary | ICD-10-CM | POA: Diagnosis not present

## 2022-10-28 DIAGNOSIS — K5900 Constipation, unspecified: Secondary | ICD-10-CM | POA: Diagnosis not present

## 2022-10-28 DIAGNOSIS — D72819 Decreased white blood cell count, unspecified: Secondary | ICD-10-CM

## 2022-10-28 DIAGNOSIS — R1030 Lower abdominal pain, unspecified: Secondary | ICD-10-CM | POA: Diagnosis not present

## 2022-10-28 DIAGNOSIS — D649 Anemia, unspecified: Secondary | ICD-10-CM | POA: Diagnosis not present

## 2022-10-28 DIAGNOSIS — N200 Calculus of kidney: Secondary | ICD-10-CM | POA: Diagnosis not present

## 2022-10-28 NOTE — Progress Notes (Signed)
HPI: Follow-up paroxysmal atrial fibrillation and aortic stenosis.  Previously followed by Dr. Kirke Corin but transitioning to me.  Nuclear study February 2018 showed ejection fraction 45% with no ischemia.  Echocardiogram October 2021 showed normal LV function, mild left ventricular hypertrophy, grade 2 diastolic dysfunction, mild left atrial enlargement, mild mitral regurgitation, mild aortic stenosis with mean gradient 13 mmHg and trace aortic insufficiency.  CT June 2022 showed aortic atherosclerosis but no abdominal aortic aneurysm.  Patient does have a history of paroxysmal atrial fibrillation and is treated with metoprolol and Xarelto.  Patient recently discharged following admission for severe microcytic anemia; initial hemoglobin 5.6; ferritin 6.  EGD showed large esophageal varices; gastric polyps noted which were removed.  Colonoscopy revealed 1 rectal polyp which was snared; diverticulosis noted.  TSH noted to be 25.14.  Also with history of thrombocytopenia.  Since last seen he denies dyspnea, chest pain, palpitations, syncope or bleeding.  Note his symptoms at the time of his anemia were worsening shortness of breath.  Current Outpatient Medications  Medication Sig Dispense Refill   allopurinol (ZYLOPRIM) 300 MG tablet Take 300 mg by mouth daily.     cyanocobalamin (VITAMIN B12) 1000 MCG tablet Take 1 tablet (1,000 mcg total) by mouth daily.     ferrous sulfate 325 (65 FE) MG EC tablet TAKE 1 TABLET (325 MG TOTAL) BY MOUTH DAILY WITH BREAKFAST. NEED LABS 90 tablet 1   levothyroxine (SYNTHROID) 50 MCG tablet Take 1 tablet (50 mcg total) by mouth daily before breakfast. 30 tablet 1   lidocaine (XYLOCAINE) 5 % ointment Apply 1 Application topically 3 (three) times daily as needed. (Patient taking differently: Apply 1 Application topically 3 (three) times daily as needed for mild pain.) 50 g 1   linaclotide (LINZESS) 145 MCG CAPS capsule Take 1 capsule (145 mcg total) by mouth daily. 30 capsule  2   metoprolol tartrate (LOPRESSOR) 25 MG tablet Take 1 tablet (25 mg total) by mouth 2 (two) times daily. 180 tablet 0   omeprazole (PRILOSEC) 40 MG capsule Take 1 capsule (40 mg total) by mouth daily. Needs appt 30 capsule 0   rivaroxaban (XARELTO) 20 MG TABS tablet TAKE 1 TABLET BY MOUTH DAILY WITH SUPPER 90 tablet 1   tiZANidine (ZANAFLEX) 4 MG tablet Take 0.5 tablets (2 mg total) by mouth every 8 (eight) hours as needed for muscle spasms. 20 tablet 0   traZODone (DESYREL) 50 MG tablet Take 0.5-1 tablets (25-50 mg total) by mouth at bedtime as needed for sleep. 30 tablet 3   No current facility-administered medications for this visit.     Past Medical History:  Diagnosis Date   ABNORMAL ELECTROCARDIOGRAM 07/04/2009   Qualifier: Diagnosis of  By: Jillyn Hidden FNP, Mcarthur Rossetti    Atrial fibrillation [facility])    Esophageal varices (HCC)    Gastric polyps    Gout    HYPERGLYCEMIA 07/27/2009   Qualifier: Diagnosis of  By: Hetty Ely MD, Franne Grip    HYPERLIPIDEMIA 07/04/2009   Qualifier: Diagnosis of  By: Jillyn Hidden FNP, [name]    Left knee DJD    OA   Portal hypertensive gastropathy (HCC)    RENAL CALCULUS 02/15/2008   Qualifier: Diagnosis of  By: Jillyn Hidden FNP, Mcarthur Rossetti    S/P total knee replacement 08/07/2009   right   Thyroid disease     Past Surgical History:  Procedure Laterality Date   APPENDECTOMY     age 72   CARDIAC CATHETERIZATION  01/23/2005  Normal   COLONOSCOPY WITH PROPOFOL N/A 10/03/2022   Procedure: COLONOSCOPY WITH PROPOFOL;  Surgeon: Jeani Hawking, MD;  Location: [facility];  Service: Gastroenterology;  Laterality: N/A;   ESOPHAGOGASTRODUODENOSCOPY (EGD) WITH PROPOFOL N/A 10/03/2022   Procedure: ESOPHAGOGASTRODUODENOSCOPY (EGD) WITH PROPOFOL;  Surgeon: Jeani Hawking, MD;  Location: [facility];  Service: Gastroenterology;  Laterality: N/A;   FINGER SURGERY     RIGHT INDEX, REATTACHED   HEMOSTASIS CLIP PLACEMENT  10/03/2022   Procedure:  HEMOSTASIS CLIP PLACEMENT;  Surgeon: Jeani Hawking, MD;  Location: [facility];  Service: Gastroenterology;;   orthoscopy     Right and left knee   POLYPECTOMY  10/03/2022   Procedure: POLYPECTOMY;  Surgeon: Jeani Hawking, MD;  Location: [facility];  Service: Gastroenterology;;   TOTAL KNEE ARTHROPLASTY  2011   Right   TOTAL KNEE ARTHROPLASTY Left 09/21/2012   Dr Thurston Hole   TOTAL KNEE ARTHROPLASTY Left 09/21/2012   Procedure: TOTAL KNEE ARTHROPLASTY;  Surgeon: Nilda Simmer, MD;  Location: [facility] OR;  Service: Orthopedics;  Laterality: Left;    Social History   Socioeconomic History   Marital status: Married    Spouse name: Not on file   Number of children: 1   Years of education: Not on file   Highest education level: Not on file  Occupational History   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     [employer].     Married 1977   Social Determinants of Health   Financial Resource Strain: Not on file  Food Insecurity: No Food Insecurity (10/02/2022)   Hunger Vital Sign    Worried About Running Out of Food in the Last Year: Never true    Ran Out of Food in the Last Year: Never true  Transportation Needs: No Transportation Needs (10/04/2022)   PRAPARE - Administrator, Civil Service (Medical): No    Lack of Transportation (Non-Medical): No  Physical Activity: Not on file  Stress: Not on file  Social Connections: Not on file  Intimate Partner Violence: Not At Risk (10/02/2022)   Humiliation, Afraid, Rape, and Kick questionnaire    Fear of Current or Ex-Partner: No    Emotionally Abused: No    Physically Abused: No    Sexually Abused: No    Family  History  Problem Relation Age of Onset   Stroke Father    Healthy Mother    Healthy Brother    Healthy Son    Colon cancer Neg Hx    Prostate cancer Neg Hx     ROS: no fevers or chills, productive cough, hemoptysis, dysphasia, odynophagia, melena, hematochezia, dysuria, hematuria, rash, seizure activity, orthopnea, PND, pedal edema, claudication. Remaining systems are negative.  Physical Exam: Well-developed well-nourished in no acute distress.  Skin is warm and dry.  HEENT is normal.  Neck is supple.  Chest is clear to auscultation with normal expansion.  Cardiovascular exam is regular rate and rhythm.  2/6 systolic murmur left sternal border. Abdominal exam nontender or distended. No masses palpated. Extremities show no edema. neuro grossly intact  A/P  1 paroxysmal atrial fibrillation-patient remains in sinus rhythm today.  Continue metoprolol for rate control if atrial fibrillation recurs.  Continue Xarelto.  Patient recently admitted with severe microcytic anemia.  This was felt secondary to GI blood loss.  I am concerned about continuing Xarelto long-term.  We discussed Watchman device today.  He will consider and contact us if he would like to proceed.  2 history of aortic stenosis-patient needs follow-up echocardiogram and we will arrange.  3 hyperlipidemia-followed by primary care.  4 severe microcytic anemia-this is felt likely secondary to GI blood loss.  Follow-up primary care and gastroenterology.  Olga Millers, MD

## 2022-10-29 DIAGNOSIS — M1009 Idiopathic gout, multiple sites: Secondary | ICD-10-CM | POA: Diagnosis not present

## 2022-10-29 DIAGNOSIS — Z79899 Other long term (current) drug therapy: Secondary | ICD-10-CM | POA: Diagnosis not present

## 2022-10-29 DIAGNOSIS — M1991 Primary osteoarthritis, unspecified site: Secondary | ICD-10-CM | POA: Diagnosis not present

## 2022-10-29 DIAGNOSIS — E663 Overweight: Secondary | ICD-10-CM | POA: Diagnosis not present

## 2022-10-29 DIAGNOSIS — Z6826 Body mass index (BMI) 26.0-26.9, adult: Secondary | ICD-10-CM | POA: Diagnosis not present

## 2022-10-30 NOTE — Progress Notes (Signed)
Leroy,   Slightly enlarged spleen.  Renal stones noted Some plaque seen in aorta Small inguinal hernias Mild prostate enlargement  We could discuss prostate medication if having urinary symptoms I would consider starting a statin to help decrease plaque in aorta, thoughts?

## 2022-11-04 ENCOUNTER — Encounter: Payer: Self-pay | Admitting: Physician Assistant

## 2022-11-04 ENCOUNTER — Encounter: Payer: Self-pay | Admitting: Cardiology

## 2022-11-04 ENCOUNTER — Ambulatory Visit: Payer: Medicare HMO | Attending: Cardiology | Admitting: Cardiology

## 2022-11-04 VITALS — BP 120/58 | HR 60 | Ht 70.0 in | Wt 188.4 lb

## 2022-11-04 DIAGNOSIS — D649 Anemia, unspecified: Secondary | ICD-10-CM | POA: Diagnosis not present

## 2022-11-04 DIAGNOSIS — I480 Paroxysmal atrial fibrillation: Secondary | ICD-10-CM

## 2022-11-04 DIAGNOSIS — I350 Nonrheumatic aortic (valve) stenosis: Secondary | ICD-10-CM | POA: Diagnosis not present

## 2022-11-04 NOTE — Patient Instructions (Signed)
Testing/Procedures:  Your physician has requested that you have an echocardiogram. Echocardiography is a painless test that uses sound waves to create images of your heart. It provides your doctor with information about the size and shape of your heart and how well your heart's chambers and valves are working. This procedure takes approximately one hour. There are no restrictions for this procedure. Please do NOT wear cologne, perfume, aftershave, or lotions (deodorant is allowed). Please arrive 15 minutes prior to your appointment time. [address]   Follow-Up: At [facility], you and your health needs are our priority.  As part of our continuing mission to provide you with exceptional heart care, we have created designated Provider Care Teams.  These Care Teams include your primary Cardiologist (physician) and Advanced Practice Providers (APPs -  Physician Assistants and Nurse Practitioners) who all work together to provide you with the care you need, when you need it.  We recommend signing up for the patient portal called "MyChart".  Sign up information is provided on this After Visit Summary.  MyChart is used to connect with patients for Virtual Visits (Telemedicine).  Patients are able to view lab/test results, encounter notes, upcoming appointments, etc.  Non-urgent messages can be sent to your provider as well.   To learn more about what you can do with MyChart, go to [email or web address].    Your next appointment:   12 month(s)  Provider:   Olga Millers MD   Other Instructions  Left Atrial Appendage Closure Device Implantation  Left atrial appendage (LAA) closure device implantation is a procedure to put a small device in the LAA of the heart. The LAA is a small sac in the wall of the heart's top left chamber. Blood clots can form in the LAA in people with atrial fibrillation (AFib). AFib is a type of irregular or rapid heartbeat (arrhythmia). The  device closes the LAA to help prevent blood clots from forming and a potential stroke. Tell a health care provider about: Any allergies you have. All medicines you are taking, including vitamins, herbs, eye drops, creams, and over-the-counter medicines. Any problems you or family members have had with anesthesia. Any bleeding problems you have. Any surgeries you have had. Any medical conditions you have. Whether you are pregnant or may be pregnant. What are the risks? Your health care provider will talk with you about risks. These may include: Infection. Bleeding. Allergic reactions to medicines or dyes. Damage to nearby structures or organs. Blood clots. Device failure. Other risks include: Heart attack. Changes in heart rhythm. Stroke. What happens before the procedure? Eating and drinking restrictions Follow instructions from your provider about what you may eat and drink. These may include: 8 hours before the procedure Stop eating most foods. Do not eat meat, fried foods, or fatty foods. Eat only light foods, such as toast or crackers. All liquids are okay except energy drinks and alcohol. 6 hours before the procedure Stop eating. Drink only clear liquids, such as water, clear fruit juice, black coffee, plain tea, and sports drinks. Do not drink energy drinks or alcohol. 2 hours before the procedure Stop drinking all liquids. You may be allowed to take medicines with small sips of water. If you do not follow your provider's instructions, your procedure may be delayed or canceled. Medicines Ask your provider about: Changing or stopping your regular medicines. These include any diabetes medicines or blood thinners you take. Taking medicines such as aspirin and ibuprofen. These medicines  can thin your blood. Do not take them unless your provider tells you to. Taking over-the-counter medicines, vitamins, herbs, and supplements. Tests You may have a physical exam. You may  have other tests, including: Blood tests. An electrocardiogram (ECG). This checks your heart's electrical patterns and rhythms. An echocardiogram. This looks at how well the heart is pumping. General instructions Do not use any products that contain nicotine or tobacco. These products include cigarettes, chewing tobacco, and vaping devices, such as e-cigarettes. If you need help quitting, ask your provider. Ask your provider what steps will be taken to help prevent infection. These steps may include: Removing hair at the surgery site. Washing skin with a soap that kills germs. Taking antibiotics. If you will be going home right after the procedure, plan to have a responsible adult: Take you home from the hospital or clinic. You will not be allowed to drive. Care for you for the time you are told. What happens during the procedure? An IV will be inserted into one of your veins. You will be given: A sedative. This helps you relax. Anesthesia. This keeps you from feeling pain. It will make you fall asleep for surgery. An incision will be made in a large blood vessel in your groin. A soft tube (catheter) will be put through the incision and moved up through the blood vessel to reach your heart. Dye may be injected so X-rays (fluoroscopy) can be used to guide the catheter through the blood vessel. The closure device will be moved through the catheter and into your heart. The device will be placed so that it closes the LAA. X-rays will be done to make sure the device is in the right place. The catheter will be removed. The closure device will stay in your heart. After pressure is applied over the catheter insertion site to prevent bleeding, a bandage (dressing) will be placed over the area. The procedure may vary among providers and hospitals. What happens after the procedure? Your blood pressure, heart rate, breathing rate, and blood oxygen level will be monitored until you leave the hospital  or clinic. You will need to lie flat for a few hours or as told by your provider. Keep your legs straight. Do not bend or cross your legs. You may be given pain medicine. You may need to drink more fluids to flush the dye out of your body. Drink enough fluid to keep your pee (urine) pale yellow. If you were given a sedative during the procedure, it can affect you for several hours. Do not drive or operate machinery until your provider says that it is safe. This information is not intended to replace advice given to you by your health care provider. Make sure you discuss any questions you have with your health care provider. Document Revised: 01/30/2022 Document Reviewed: 01/30/2022 Elsevier Patient Education  2023 [employer].

## 2022-11-25 ENCOUNTER — Other Ambulatory Visit: Payer: Self-pay

## 2022-11-29 ENCOUNTER — Ambulatory Visit (HOSPITAL_COMMUNITY): Payer: Medicare HMO | Attending: Cardiology

## 2022-11-29 DIAGNOSIS — I350 Nonrheumatic aortic (valve) stenosis: Secondary | ICD-10-CM | POA: Diagnosis not present

## 2022-11-29 LAB — ECHOCARDIOGRAM COMPLETE
AR max vel: 2.36 cm2
AV Area VTI: 2.85 cm2
AV Area mean vel: 2.57 cm2
AV Mean grad: 13 mmHg
AV Peak grad: 26.8 mmHg
AV Vena cont: 0.3 cm
Ao pk vel: 2.59 m/s
Area-P 1/2: 3.27 cm2
Calc EF: 64.7 %
P 1/2 time: 474 msec
S' Lateral: 2.3 cm
Single Plane A2C EF: 73.1 %
Single Plane A4C EF: 56.4 %

## 2022-11-29 MED ORDER — PERFLUTREN LIPID MICROSPHERE
1.0000 mL | INTRAVENOUS | Status: AC | PRN
  Administered 2022-11-29: 4 mL via INTRAVENOUS

## 2022-12-03 ENCOUNTER — Telehealth: Payer: Self-pay | Admitting: *Deleted

## 2022-12-03 ENCOUNTER — Encounter: Payer: Self-pay | Admitting: *Deleted

## 2022-12-03 NOTE — Telephone Encounter (Signed)
-----   Message from Lewayne Bunting, MD sent at 11/29/2022 12:44 PM EDT ----- Normal LV function; mild AS; please schedule cardiac MRI to R/O apical HCM Olga Millers

## 2022-12-03 NOTE — Telephone Encounter (Signed)
pt aware of results Order placed  Instructions sent to patient via my chart 

## 2022-12-06 ENCOUNTER — Other Ambulatory Visit: Payer: Self-pay | Admitting: Physician Assistant

## 2022-12-07 ENCOUNTER — Other Ambulatory Visit: Payer: Self-pay | Admitting: Physician Assistant

## 2022-12-10 ENCOUNTER — Other Ambulatory Visit: Payer: Self-pay | Admitting: *Deleted

## 2022-12-10 DIAGNOSIS — I422 Other hypertrophic cardiomyopathy: Secondary | ICD-10-CM

## 2022-12-11 ENCOUNTER — Encounter: Payer: Self-pay | Admitting: Physician Assistant

## 2022-12-11 DIAGNOSIS — H524 Presbyopia: Secondary | ICD-10-CM | POA: Diagnosis not present

## 2022-12-13 ENCOUNTER — Encounter: Payer: Self-pay | Admitting: Physician Assistant

## 2022-12-14 ENCOUNTER — Other Ambulatory Visit: Payer: Self-pay | Admitting: Physician Assistant

## 2022-12-14 DIAGNOSIS — I480 Paroxysmal atrial fibrillation: Secondary | ICD-10-CM

## 2022-12-16 ENCOUNTER — Other Ambulatory Visit: Payer: Self-pay

## 2022-12-16 DIAGNOSIS — Z7901 Long term (current) use of anticoagulants: Secondary | ICD-10-CM

## 2022-12-16 MED ORDER — RIVAROXABAN 20 MG PO TABS: 20.0000 mg | ORAL_TABLET | Freq: Every day | ORAL | 1 refills | Status: DC

## 2022-12-16 NOTE — Telephone Encounter (Signed)
Prescription refill request for Xarelto received.  Indication:afib Last office visit:4/24 Weight:85.5  kg Age:72 Scr:0.8  3/24 CrCl:102.42  ml/min  Prescription refilled

## 2022-12-18 DIAGNOSIS — D509 Iron deficiency anemia, unspecified: Secondary | ICD-10-CM | POA: Diagnosis not present

## 2022-12-18 DIAGNOSIS — K602 Anal fissure, unspecified: Secondary | ICD-10-CM | POA: Diagnosis not present

## 2022-12-18 DIAGNOSIS — K317 Polyp of stomach and duodenum: Secondary | ICD-10-CM | POA: Diagnosis not present

## 2022-12-18 DIAGNOSIS — I8500 Esophageal varices without bleeding: Secondary | ICD-10-CM | POA: Diagnosis not present

## 2023-01-05 ENCOUNTER — Other Ambulatory Visit: Payer: Self-pay | Admitting: Physician Assistant

## 2023-01-10 ENCOUNTER — Telehealth: Payer: Self-pay | Admitting: Cardiology

## 2023-01-10 DIAGNOSIS — Z7901 Long term (current) use of anticoagulants: Secondary | ICD-10-CM

## 2023-01-10 MED ORDER — RIVAROXABAN 20 MG PO TABS: 20.0000 mg | ORAL_TABLET | Freq: Every day | ORAL | 0 refills | Status: DC

## 2023-01-10 NOTE — Telephone Encounter (Signed)
Spoke with pt wife, they can not find his medication, they feel like it was thrown out in the trash by mistake. Aware we have samples and will place them at the front desk for pickup. Application for patient assistance also placed in the bag.

## 2023-01-10 NOTE — Telephone Encounter (Signed)
Pt c/o medication issue:  1. Name of Medication:   rivaroxaban (XARELTO) 20 MG TABS tablet    2. How are you currently taking this medication (dosage and times per day)?  Take 1 tablet (20 mg total) by mouth daily with supper.       3. Are you having a reaction (difficulty breathing--STAT)? No  4. What is your medication issue? Pt's wife, Darel Hong, is requesting a callback at [phone]  after noticing she may have accidentally thrown out pt's medication so she'd like to know of other options. Please advise

## 2023-01-13 ENCOUNTER — Ambulatory Visit: Payer: Medicare HMO | Admitting: Physician Assistant

## 2023-01-14 ENCOUNTER — Other Ambulatory Visit: Payer: Self-pay | Admitting: Physician Assistant

## 2023-01-14 ENCOUNTER — Encounter: Payer: Self-pay | Admitting: Physician Assistant

## 2023-01-14 ENCOUNTER — Ambulatory Visit (INDEPENDENT_AMBULATORY_CARE_PROVIDER_SITE_OTHER): Payer: Medicare HMO | Admitting: Physician Assistant

## 2023-01-14 VITALS — BP 134/72 | HR 69 | Ht 70.0 in | Wt 185.0 lb

## 2023-01-14 DIAGNOSIS — D649 Anemia, unspecified: Secondary | ICD-10-CM | POA: Diagnosis not present

## 2023-01-14 DIAGNOSIS — K5904 Chronic idiopathic constipation: Secondary | ICD-10-CM | POA: Diagnosis not present

## 2023-01-14 DIAGNOSIS — E039 Hypothyroidism, unspecified: Secondary | ICD-10-CM

## 2023-01-14 DIAGNOSIS — D696 Thrombocytopenia, unspecified: Secondary | ICD-10-CM

## 2023-01-14 DIAGNOSIS — Z8719 Personal history of other diseases of the digestive system: Secondary | ICD-10-CM | POA: Diagnosis not present

## 2023-01-14 DIAGNOSIS — F5101 Primary insomnia: Secondary | ICD-10-CM

## 2023-01-14 DIAGNOSIS — K5900 Constipation, unspecified: Secondary | ICD-10-CM | POA: Diagnosis not present

## 2023-01-14 LAB — CBC WITH DIFFERENTIAL/PLATELET
Basophils Absolute: 8 cells/uL (ref 0–200)
Eosinophils Relative: 3.6 %
HCT: 42.3 % (ref 38.5–50.0)
Lymphs Abs: 554 cells/uL — ABNORMAL LOW (ref 850–3900)
MCH: 26.3 pg — ABNORMAL LOW (ref 27.0–33.0)
MCHC: 30.3 g/dL — ABNORMAL LOW (ref 32.0–36.0)
MCV: 87 fL (ref 80.0–100.0)
Neutro Abs: 3062 cells/uL (ref 1500–7800)
Neutrophils Relative %: 72.9 %
Platelets: 93 10*3/uL — ABNORMAL LOW (ref 140–400)
WBC: 4.2 10*3/uL (ref 3.8–10.8)

## 2023-01-14 MED ORDER — OMEPRAZOLE 40 MG PO CPDR: 40.0000 mg | DELAYED_RELEASE_CAPSULE | Freq: Every day | ORAL | 3 refills | Status: DC

## 2023-01-14 MED ORDER — LINACLOTIDE 145 MCG PO CAPS: 145.0000 ug | ORAL_CAPSULE | Freq: Every day | ORAL | 3 refills | Status: DC

## 2023-01-14 NOTE — Patient Instructions (Signed)
Get labs to check hemoglobin Referral made to pharmacist to help with cost of linzess

## 2023-01-14 NOTE — Progress Notes (Signed)
Established Patient Office Visit  Subjective   Patient ID: Joel Ford, male    DOB: [birth year]  Age: 72 y.o. MRN: [id number]  Chief Complaint  Patient presents with   Follow-up    HPI Pt is a 72 yo male with hx of GI bleed and anemia, hypothyroidism, constipation who presents to the clinic to follow up.   Pt denies any GI issues or problems. Stools are normal with linzess. He is having trouble affording it at 120 dollars a month. Miralax was not helping like linzess. Denies any abdominal pain. His energy is better.   He has continued on xarelto. Cardiology is discussing watchman procedure to replace xarelto.   .. Active Ambulatory Problems    Diagnosis Date Noted   Hyperlipidemia 07/04/2009   GOUT 01/22/2008   EXTERNAL HEMORRHOIDS 12/26/2009   RENAL CALCULUS 02/15/2008   Thoracic or lumbosacral neuritis or radiculitis 02/15/2008   CHEST WALL PAIN, ACUTE 08/30/2008   HYPERGLYCEMIA 07/27/2009   ABNORMAL ELECTROCARDIOGRAM 07/04/2009   Arthritis of knee, degenerative 08/30/2012   Preop cardiovascular exam 09/06/2012   Gout    Left knee DJD    S/P total knee replacement 08/07/2009   PAF (paroxysmal atrial fibrillation) (HCC) 08/05/2016   New onset atrial fibrillation (HCC) 08/06/2016   B12 deficiency anemia 08/06/2016   Anemia, iron deficiency 08/06/2016   Hypothyroidism 08/06/2016   Health care maintenance 09/03/2016   Essential hypertension 03/03/2017   Tick bite 11/19/2018   Leukopenia 12/19/2021   Normocytic anemia 12/19/2021   Thrombocytopenia (HCC) 12/19/2021   Acute left-sided low back pain without sciatica 12/19/2021   Folate deficiency 12/21/2021   DDD (degenerative disc disease), lumbar 12/21/2021   Primary localized osteoarthrosis of multiple sites 07/18/2022   Hemorrhoids 07/19/2022   Colon cancer screening 07/19/2022   Slow transit constipation 07/19/2022   Internal hemorrhoids 07/19/2022   Constipation 10/01/2022   Umbilical hernia without  obstruction and without gangrene 10/01/2022   GIB (gastrointestinal bleeding) 10/02/2022   Anemia 10/02/2022   Elevated random blood glucose level 10/14/2022   Elevated TSH 10/14/2022   B12 deficiency 10/14/2022   History of GI bleed 01/14/2023   Resolved Ambulatory Problems    Diagnosis Date Noted   Cellulitis and abscess of upper arm and forearm 06/18/2007   HEEL PAIN, RIGHT 11/10/2006   Past Medical History:  Diagnosis Date   Atrial fibrillation (HCC)    Esophageal varices (HCC)    Gastric polyps    HYPERLIPIDEMIA 07/04/2009   Portal hypertensive gastropathy (HCC)    Thyroid disease      ROS See HPI.    Objective:     BP 134/72 (BP Location: Right Arm, Patient Position: Sitting, Cuff Size: Normal)   Pulse 69   Ht 5\' 10"  (1.778 m)   Wt 185 lb (83.9 kg)   SpO2 100%   BMI 26.54 kg/m  BP Readings from Last 3 Encounters:  01/14/23 134/72  11/04/22 (!) 120/58  10/14/22 135/71   Wt Readings from Last 3 Encounters:  01/14/23 185 lb (83.9 kg)  11/04/22 188 lb 6.4 oz (85.5 kg)  10/14/22 186 lb (84.4 kg)      Physical Exam Constitutional:      Appearance: Normal appearance.  HENT:     Head: Normocephalic.  Neck:     Vascular: No carotid bruit.  Cardiovascular:     Rate and Rhythm: Normal rate and regular rhythm.     Heart sounds: Murmur heard.  Pulmonary:     Effort: Pulmonary effort  is normal.     Breath sounds: Normal breath sounds.  Abdominal:     General: There is no distension.     Palpations: Abdomen is soft.     Tenderness: There is no abdominal tenderness.  Musculoskeletal:     Cervical back: Normal range of motion and neck supple. No rigidity or tenderness.     Right lower leg: No edema.     Left lower leg: No edema.  Lymphadenopathy:     Cervical: No cervical adenopathy.  Neurological:     General: No focal deficit present.     Mental Status: He is oriented to person, place, and time.  Psychiatric:        Mood and Affect: Mood normal.         Assessment & Plan:  Marland KitchenMarland KitchenNajib was seen today for follow-up.  Diagnoses and all orders for this visit:  Chronic idiopathic constipation -     linaclotide (LINZESS) 145 MCG CAPS capsule; Take 1 capsule (145 mcg total) by mouth daily. -     COMPLETE METABOLIC PANEL WITH GFR -     AMB Referral to Pharmacy Medication Management  Thrombocytopenia (HCC) -     COMPLETE METABOLIC PANEL WITH GFR -     CBC w/Diff/Platelet  Normocytic anemia -     COMPLETE METABOLIC PANEL WITH GFR -     CBC w/Diff/Platelet -     Fe+TIBC+Fer  Hypothyroidism, unspecified type -     TSH + free T4  History of GI bleed -     omeprazole (PRILOSEC) 40 MG capsule; Take 1 capsule (40 mg total) by mouth daily.   Refilled linzess Sent referral to pharmacy to see if they can help it become more affordable Labs to recheck thyroid/anemia/low platelets Continue omeprazole to help protect stomach Continue anticoagulation for now and follow up with cardiology regarding watchman device.    Return in about 6 months (around 07/17/2023).    Tandy Gaw, PA-C

## 2023-01-15 ENCOUNTER — Telehealth: Payer: Self-pay

## 2023-01-15 ENCOUNTER — Other Ambulatory Visit: Payer: Self-pay | Admitting: Physician Assistant

## 2023-01-15 LAB — COMPLETE METABOLIC PANEL WITH GFR
AG Ratio: 1.9 (calc) (ref 1.0–2.5)
ALT: 7 U/L — ABNORMAL LOW (ref 9–46)
AST: 14 U/L (ref 10–35)
Albumin: 4.3 g/dL (ref 3.6–5.1)
Alkaline phosphatase (APISO): 86 U/L (ref 35–144)
BUN: 17 mg/dL (ref 7–25)
CO2: 29 mmol/L (ref 20–32)
Calcium: 9.1 mg/dL (ref 8.6–10.3)
Chloride: 106 mmol/L (ref 98–110)
Creat: 0.98 mg/dL (ref 0.70–1.28)
Globulin: 2.3 g/dL (calc) (ref 1.9–3.7)
Glucose, Bld: 121 mg/dL — ABNORMAL HIGH (ref 65–99)
Potassium: 4.8 mmol/L (ref 3.5–5.3)
Sodium: 142 mmol/L (ref 135–146)
Total Bilirubin: 0.5 mg/dL (ref 0.2–1.2)
Total Protein: 6.6 g/dL (ref 6.1–8.1)
eGFR: 82 mL/min/{1.73_m2} (ref >=60)

## 2023-01-15 LAB — TSH+FREE T4: TSH W/REFLEX TO FT4: 8.24 mIU/L — ABNORMAL HIGH (ref 0.40–4.50)

## 2023-01-15 LAB — CBC WITH DIFFERENTIAL/PLATELET
Absolute Monocytes: 424 cells/uL (ref 200–950)
Basophils Relative: 0.2 %
Eosinophils Absolute: 151 cells/uL (ref 15–500)
Hemoglobin: 12.8 g/dL — ABNORMAL LOW (ref 13.2–17.1)
Monocytes Relative: 10.1 %
RBC: 4.86 10*6/uL (ref 4.20–5.80)
RDW: 18.4 % — ABNORMAL HIGH (ref 11.0–15.0)
Total Lymphocyte: 13.2 %

## 2023-01-15 LAB — IRON,TIBC AND FERRITIN PANEL
%SAT: 18 % (calc) — ABNORMAL LOW (ref 20–48)
Ferritin: 19 ng/mL — ABNORMAL LOW (ref 24–380)
Iron: 64 ug/dL (ref 50–180)
TIBC: 364 mcg/dL (calc) (ref 250–425)

## 2023-01-15 LAB — T4, FREE: Free T4: 1.1 ng/dL (ref 0.8–1.8)

## 2023-01-15 MED ORDER — LEVOTHYROXINE SODIUM 75 MCG PO TABS: 75.0000 ug | ORAL_TABLET | Freq: Every day | ORAL | 0 refills | Status: DC

## 2023-01-15 NOTE — Progress Notes (Signed)
   Care Guide Note  01/15/2023 Name: Murat Rideout MRN: [id number] DOB: [birth year]  Referred by: Jomarie Longs, PA-C Reason for referral : Care Coordination (Outreach to schedule with pharm d )   Purvis Sidle is a 72 y.o. year old male who is a primary care patient of Jomarie Longs, [place]. Marcy Panning was referred to the pharmacist for assistance related to  other  .    Successful contact was made with the patient to discuss pharmacy services including being ready for the pharmacist to call at least 5 minutes before the scheduled appointment time, to have medication bottles and any blood sugar or blood pressure readings ready for review. The patient agreed to meet with the pharmacist via with the pharmacist via telephone visit on (date/time).  02/03/2023  Penne Lash, RMA Care Guide [facility]  [place], [place] [address] Direct Dial: [phone] [email or web address]

## 2023-01-15 NOTE — Progress Notes (Signed)
Joel Ford,   TSH is still too high. Meaning your free thyroid levels are on the low side. Increased levothyroxine to . Recheck labs in 3 months.  Hemoglobin is MUCH better at 12.8. continue on oral iron. Iron stores still low.  Platelets stable.

## 2023-01-20 ENCOUNTER — Telehealth: Payer: Self-pay

## 2023-01-20 DIAGNOSIS — D509 Iron deficiency anemia, unspecified: Secondary | ICD-10-CM | POA: Diagnosis not present

## 2023-01-20 DIAGNOSIS — I8500 Esophageal varices without bleeding: Secondary | ICD-10-CM | POA: Diagnosis not present

## 2023-01-20 DIAGNOSIS — K602 Anal fissure, unspecified: Secondary | ICD-10-CM | POA: Diagnosis not present

## 2023-01-20 NOTE — Telephone Encounter (Signed)
   Pre-operative Risk Assessment    Patient Name: Joel Ford  DOB: Aug 13, 1950 MRN: [id number]     Request for Surgical Clearance    Procedure:  EGD  Date of Surgery:  Clearance 04/15/23                               Surgeon:  Dr. Iva Lento Group or Practice Name:  [facility]  Phone number:  [phone] Fax number:  [phone]   Type of Clearance Requested:   - Pharmacy:  Hold Rivaroxaban (Xarelto)     Type of Anesthesia:  Propofol    Additional requests/questions:    Scarlette Shorts   01/20/2023, 11:37 AM

## 2023-01-21 ENCOUNTER — Telehealth: Payer: Self-pay | Admitting: *Deleted

## 2023-01-21 NOTE — Telephone Encounter (Signed)
Patient with diagnosis of afib on Xarelto for anticoagulation.    Procedure: EGD Date of procedure: 04/15/23  CHA2DS2-VASc Score = 3  This indicates a 3.2% annual risk of stroke. The patient's score is based upon: CHF History: 0 HTN History: 1 Diabetes History: 0 Stroke History: 0 Vascular Disease History: 1 (aortic atherosclerosis) Age Score: 1 Gender Score: 0  CrCl 32mL/min Platelet count 93K  Per office protocol, patient can hold Xarelto for 1-2 days prior to procedure.    **This guidance is not considered finalized until pre-operative APP has relayed final recommendations.**

## 2023-01-21 NOTE — Telephone Encounter (Signed)
Per pt's wife DR. CRENSHAW IS NOW PRIMARY CARD.[name] pt has been scheduled for tele pre op appt 03/24/23 @ 9 am. Med rec and consent are done.      Patient Consent for Virtual Visit        Joel Ford has provided verbal consent on 01/21/2023 for a virtual visit (video or telephone).   CONSENT FOR VIRTUAL VISIT FOR:  Joel Ford  By participating in this virtual visit I agree to the following:  I hereby voluntarily request, consent and authorize [facility] and its employed or contracted physicians, physician assistants, nurse practitioners or other licensed health care professionals (the Practitioner), to provide me with telemedicine health care services (the "Services") as deemed necessary by the treating Practitioner. I acknowledge and consent to receive the Services by the Practitioner via telemedicine. I understand that the telemedicine visit will involve communicating with the Practitioner through live audiovisual communication technology and the disclosure of certain medical information by electronic transmission. I acknowledge that I have been given the opportunity to request an in-person assessment or other available alternative prior to the telemedicine visit and am voluntarily participating in the telemedicine visit.  I understand that I have the right to withhold or withdraw my consent to the use of telemedicine in the course of my care at any time, without affecting my right to future care or treatment, and that the Practitioner or I may terminate the telemedicine visit at any time. I understand that I have the right to inspect all information obtained and/or recorded in the course of the telemedicine visit and may receive copies of available information for a reasonable fee.  I understand that some of the potential risks of receiving the Services via telemedicine include:  Delay or interruption in medical evaluation due to technological equipment failure or  disruption; Information transmitted may not be sufficient (e.g. poor resolution of images) to allow for appropriate medical decision making by the Practitioner; and/or  In rare instances, security protocols could fail, causing a breach of personal health information.  Furthermore, I acknowledge that it is my responsibility to provide information about my medical history, conditions and care that is complete and accurate to the best of my ability. I acknowledge that Practitioner's advice, recommendations, and/or decision may be based on factors not within their control, such as incomplete or inaccurate data provided by me or distortions of diagnostic images or specimens that may result from electronic transmissions. I understand that the practice of medicine is not an exact science and that Practitioner makes no warranties or guarantees regarding treatment outcomes. I acknowledge that a copy of this consent can be made available to me via my patient portal [facility] MyChart), or I can request a printed copy by calling the office of [facility].    I understand that my insurance will be billed for this visit.   I have read or had this consent read to me. I understand the contents of this consent, which adequately explains the benefits and risks of the Services being provided via telemedicine.  I have been provided ample opportunity to ask questions regarding this consent and the Services and have had my questions answered to my satisfaction. I give my informed consent for the services to be provided through the use of telemedicine in my medical care

## 2023-01-21 NOTE — Telephone Encounter (Signed)
Per pt's wife DR. CRENSHAW IS NOW PRIMARY CARD.[name] pt has been scheduled for tele pre op appt 03/24/23 @ 9 am. Med rec and consent are done.

## 2023-01-21 NOTE — Telephone Encounter (Signed)
   Name: Joel Ford  DOB: 12-13-1950  MRN: [id number]  Primary Cardiologist: Lorine Bears, MD   Preoperative team, please contact this patient and set up a phone call appointment for further preoperative risk assessment. Please obtain consent and complete medication review. Thank you for your help.  I confirm that guidance regarding antiplatelet and oral anticoagulation therapy has been completed and, if necessary, noted below.  Pharmacy has provided recommendations   Ronney Asters, NP 01/21/2023, 11:27 AM [facility]

## 2023-02-03 ENCOUNTER — Other Ambulatory Visit: Payer: Medicare HMO

## 2023-02-03 NOTE — Progress Notes (Signed)
   02/03/2023  Patient ID: Joel Ford, male   DOB: 10-11-1950, 72 y.o.   MRN: [id number]  Telephone visit to assist with affordability of medication(s)  -Patient is prescribed Linzess daily, and this is costing $180/month -Discussed eligibility for [employer] PAP for Linzess and patient would qualify.  Working with medication assistance team to get PAP application started. -Discussed affordability of Xarelto; and there is no longer PAP that provides medication at no cost.  There is a program that could make Xarelto $85/month or $240/90 days, but this is not less than what Joel Ford is currently paying.   Will monitor progress of Linzess PAP to make sure patient/provider aware.  Lenna Gilford, PharmD, DPLA

## 2023-02-08 ENCOUNTER — Other Ambulatory Visit: Payer: Self-pay | Admitting: Physician Assistant

## 2023-02-10 DIAGNOSIS — H25813 Combined forms of age-related cataract, bilateral: Secondary | ICD-10-CM | POA: Diagnosis not present

## 2023-02-10 DIAGNOSIS — H52223 Regular astigmatism, bilateral: Secondary | ICD-10-CM | POA: Diagnosis not present

## 2023-02-10 DIAGNOSIS — H35363 Drusen (degenerative) of macula, bilateral: Secondary | ICD-10-CM | POA: Diagnosis not present

## 2023-02-18 ENCOUNTER — Telehealth: Payer: Self-pay

## 2023-02-18 NOTE — Telephone Encounter (Signed)
-----   Message from Lenna Gilford sent at 02/14/2023  7:40 AM EDT ----- Good morning!  Patient would qualify for Linzess PAP through [employer].  Would you please initiate the application for his daily?  Thank you!

## 2023-02-18 NOTE — Telephone Encounter (Signed)
PAP: PAP application for ;LINZESS, ([employer]) has been mailed to pt's home address on file. Will fax provider portion of application to provider's office when pt's portion is received.   PLEASE BE ADVISED

## 2023-02-19 ENCOUNTER — Other Ambulatory Visit: Payer: Self-pay | Admitting: Physician Assistant

## 2023-02-19 DIAGNOSIS — F5101 Primary insomnia: Secondary | ICD-10-CM

## 2023-02-24 DIAGNOSIS — H25813 Combined forms of age-related cataract, bilateral: Secondary | ICD-10-CM | POA: Diagnosis not present

## 2023-03-03 ENCOUNTER — Encounter (HOSPITAL_COMMUNITY): Payer: Self-pay

## 2023-03-03 ENCOUNTER — Ambulatory Visit (HOSPITAL_COMMUNITY)
Admission: RE | Admit: 2023-03-03 | Discharge: 2023-03-03 | Disposition: A | Discharge status: Home / Self Care | Payer: Medicare HMO | Source: Ambulatory Visit | Attending: Cardiology | Admitting: Cardiology

## 2023-03-03 DIAGNOSIS — I422 Other hypertrophic cardiomyopathy: Secondary | ICD-10-CM

## 2023-03-06 DIAGNOSIS — Z79890 Hormone replacement therapy (postmenopausal): Secondary | ICD-10-CM | POA: Diagnosis not present

## 2023-03-06 DIAGNOSIS — I4891 Unspecified atrial fibrillation: Secondary | ICD-10-CM | POA: Diagnosis not present

## 2023-03-06 DIAGNOSIS — Z7901 Long term (current) use of anticoagulants: Secondary | ICD-10-CM | POA: Diagnosis not present

## 2023-03-06 DIAGNOSIS — I10 Essential (primary) hypertension: Secondary | ICD-10-CM | POA: Diagnosis not present

## 2023-03-06 DIAGNOSIS — Z79899 Other long term (current) drug therapy: Secondary | ICD-10-CM | POA: Diagnosis not present

## 2023-03-06 DIAGNOSIS — H52223 Regular astigmatism, bilateral: Secondary | ICD-10-CM | POA: Diagnosis not present

## 2023-03-06 DIAGNOSIS — H5703 Miosis: Secondary | ICD-10-CM | POA: Diagnosis not present

## 2023-03-06 DIAGNOSIS — M1990 Unspecified osteoarthritis, unspecified site: Secondary | ICD-10-CM | POA: Diagnosis not present

## 2023-03-06 DIAGNOSIS — E039 Hypothyroidism, unspecified: Secondary | ICD-10-CM | POA: Diagnosis not present

## 2023-03-06 DIAGNOSIS — K589 Irritable bowel syndrome without diarrhea: Secondary | ICD-10-CM | POA: Diagnosis not present

## 2023-03-06 DIAGNOSIS — H25813 Combined forms of age-related cataract, bilateral: Secondary | ICD-10-CM | POA: Diagnosis not present

## 2023-03-06 DIAGNOSIS — H25812 Combined forms of age-related cataract, left eye: Secondary | ICD-10-CM | POA: Diagnosis not present

## 2023-03-06 DIAGNOSIS — K219 Gastro-esophageal reflux disease without esophagitis: Secondary | ICD-10-CM | POA: Diagnosis not present

## 2023-03-06 DIAGNOSIS — H35363 Drusen (degenerative) of macula, bilateral: Secondary | ICD-10-CM | POA: Diagnosis not present

## 2023-03-06 DIAGNOSIS — H2181 Floppy iris syndrome: Secondary | ICD-10-CM | POA: Diagnosis not present

## 2023-03-17 ENCOUNTER — Other Ambulatory Visit (HOSPITAL_COMMUNITY): Payer: Self-pay

## 2023-03-17 NOTE — Telephone Encounter (Signed)
Faxed provider form for Linzess pt assistance to [phone]

## 2023-03-20 DIAGNOSIS — I340 Nonrheumatic mitral (valve) insufficiency: Secondary | ICD-10-CM | POA: Diagnosis not present

## 2023-03-20 DIAGNOSIS — D696 Thrombocytopenia, unspecified: Secondary | ICD-10-CM | POA: Diagnosis not present

## 2023-03-20 DIAGNOSIS — H52223 Regular astigmatism, bilateral: Secondary | ICD-10-CM | POA: Diagnosis not present

## 2023-03-20 DIAGNOSIS — H2181 Floppy iris syndrome: Secondary | ICD-10-CM | POA: Diagnosis not present

## 2023-03-20 DIAGNOSIS — Z79890 Hormone replacement therapy (postmenopausal): Secondary | ICD-10-CM | POA: Diagnosis not present

## 2023-03-20 DIAGNOSIS — Z961 Presence of intraocular lens: Secondary | ICD-10-CM | POA: Diagnosis not present

## 2023-03-20 DIAGNOSIS — Z79899 Other long term (current) drug therapy: Secondary | ICD-10-CM | POA: Diagnosis not present

## 2023-03-20 DIAGNOSIS — E039 Hypothyroidism, unspecified: Secondary | ICD-10-CM | POA: Diagnosis not present

## 2023-03-20 DIAGNOSIS — I350 Nonrheumatic aortic (valve) stenosis: Secondary | ICD-10-CM | POA: Diagnosis not present

## 2023-03-20 DIAGNOSIS — I351 Nonrheumatic aortic (valve) insufficiency: Secondary | ICD-10-CM | POA: Diagnosis not present

## 2023-03-20 DIAGNOSIS — E785 Hyperlipidemia, unspecified: Secondary | ICD-10-CM | POA: Diagnosis not present

## 2023-03-20 DIAGNOSIS — F17290 Nicotine dependence, other tobacco product, uncomplicated: Secondary | ICD-10-CM | POA: Diagnosis not present

## 2023-03-20 DIAGNOSIS — K219 Gastro-esophageal reflux disease without esophagitis: Secondary | ICD-10-CM | POA: Diagnosis not present

## 2023-03-20 DIAGNOSIS — H25811 Combined forms of age-related cataract, right eye: Secondary | ICD-10-CM | POA: Diagnosis not present

## 2023-03-20 DIAGNOSIS — H5703 Miosis: Secondary | ICD-10-CM | POA: Diagnosis not present

## 2023-03-20 DIAGNOSIS — Z7901 Long term (current) use of anticoagulants: Secondary | ICD-10-CM | POA: Diagnosis not present

## 2023-03-20 DIAGNOSIS — I4891 Unspecified atrial fibrillation: Secondary | ICD-10-CM | POA: Diagnosis not present

## 2023-03-24 ENCOUNTER — Ambulatory Visit: Payer: Medicare HMO | Attending: Physician Assistant

## 2023-03-24 DIAGNOSIS — Z01810 Encounter for preprocedural cardiovascular examination: Secondary | ICD-10-CM | POA: Diagnosis not present

## 2023-03-24 NOTE — Progress Notes (Signed)
Virtual Visit via Telephone Note   Because of Joel Ford's co-morbid illnesses, he is at least at moderate risk for complications without adequate follow up.  This format is felt to be most appropriate for this patient at this time.  The patient did not have access to video technology/had technical difficulties with video requiring transitioning to audio format only (telephone).  All issues noted in this document were discussed and addressed.  No physical exam could be performed with this format.  Please refer to the patient's chart for his consent to telehealth for [facility].  Evaluation Performed:  Preoperative cardiovascular risk assessment _____________   Date:  03/24/2023   Patient ID:  Joel Ford, DOB 1950-10-26, MRN [id number] Patient Location:  Home Provider location:   Office  Primary Care Provider:  Nolene Ford Primary Cardiologist:  None  Chief Complaint / Patient Profile   72 y.o. y/o male with a h/o paroxysmal atrial fibrillation and aortic stenosis who is pending EGD scheduled for April 15, 2023 and presents today for telephonic preoperative cardiovascular risk assessment.  History of Present Illness    Joel Ford is a 72 y.o. male who presents via audio/video conferencing for a telehealth visit today.  Pt was last seen in cardiology clinic on 11/04/2022 by Joel. Jens Ford.  At that time Joel Ford was doing well .  The patient is now pending procedure as outlined above. Since his last visit, he has not had any issues with chest pains or SOB and palpitations. He has had his knees replaced which does hold him back a little but he is still able to walk 1 mile a day. He has exceeded 4 mets.   Per office protocol, patient can hold Xarelto for 1-2 days prior to procedure.    Past Medical History    Past Medical History:  Diagnosis Date   ABNORMAL ELECTROCARDIOGRAM 07/04/2009   Qualifier: Diagnosis of  By: Joel Ford, Joel Ford     Atrial fibrillation [facility])    Esophageal varices (HCC)    Gastric polyps    Gout    HYPERGLYCEMIA 07/27/2009   Qualifier: Diagnosis of  By: Joel Ford, Joel Ford    HYPERLIPIDEMIA 07/04/2009   Qualifier: Diagnosis of  By: Joel Ford, Joel Ford    Left knee DJD    OA   Portal hypertensive gastropathy (HCC)    RENAL CALCULUS 02/15/2008   Qualifier: Diagnosis of  By: Joel Ford, Joel Ford    S/P total knee replacement 08/07/2009   right   Thyroid disease    Past Surgical History:  Procedure Laterality Date   APPENDECTOMY     age 9   CARDIAC CATHETERIZATION  01/23/2005   Normal   COLONOSCOPY WITH PROPOFOL N/A 10/03/2022   Procedure: COLONOSCOPY WITH PROPOFOL;  Surgeon: Joel Hawking, Ford;  Location: [facility];  Service: Gastroenterology;  Laterality: N/A;   ESOPHAGOGASTRODUODENOSCOPY (EGD) WITH PROPOFOL N/A 10/03/2022   Procedure: ESOPHAGOGASTRODUODENOSCOPY (EGD) WITH PROPOFOL;  Surgeon: Joel Hawking, Ford;  Location: [facility];  Service: Gastroenterology;  Laterality: N/A;   FINGER SURGERY     RIGHT INDEX, REATTACHED   HEMOSTASIS CLIP PLACEMENT  10/03/2022   Procedure: HEMOSTASIS CLIP PLACEMENT;  Surgeon: Joel Hawking, Ford;  Location: [facility];  Service: Gastroenterology;;   orthoscopy     Right and left knee   POLYPECTOMY  10/03/2022   Procedure: POLYPECTOMY;  Surgeon: Joel Hawking, Ford;  Location: [facility];  Service: Gastroenterology;;   TOTAL KNEE  ARTHROPLASTY  2011   Right   TOTAL KNEE ARTHROPLASTY Left 09/21/2012   Joel Ford   TOTAL KNEE ARTHROPLASTY Left 09/21/2012   Procedure: TOTAL KNEE ARTHROPLASTY;  Surgeon: Joel Simmer, Ford;  Location: [facility] OR;  Service: Orthopedics;  Laterality: Left;    Allergies  No Known Allergies  Home Medications    Prior to Admission medications   Medication Sig Start Date End Date Taking? Authorizing Provider  allopurinol (ZYLOPRIM) 300 MG tablet Take 300 mg by mouth daily. 01/27/19   Provider, Historical, Ford   cyanocobalamin (VITAMIN B12) 1000 MCG tablet Take 1 tablet (1,000 mcg total) by mouth daily. 10/04/22   Joel Brooking, Ford  ferrous sulfate 325 (65 FE) MG EC tablet TAKE 1 TABLET (325 MG TOTAL) BY MOUTH DAILY WITH BREAKFAST. NEED LABS 07/19/22   Joel Ford  levothyroxine (SYNTHROID) 75 MCG tablet Take 1 tablet (75 mcg total) by mouth daily before breakfast. 01/15/23   Joel Ford  lidocaine (XYLOCAINE) 5 % ointment Apply 1 Application topically 3 (three) times daily as needed. Patient taking differently: Apply 1 Application topically 3 (three) times daily as needed for mild pain. 07/16/22   Joel Ford  linaclotide (LINZESS) 145 MCG CAPS capsule Take 1 capsule (145 mcg total) by mouth daily. 01/14/23   Joel Ford  metoprolol tartrate (LOPRESSOR) 25 MG tablet TAKE 1 TABLET BY MOUTH TWICE A DAY 12/17/22   Joel Ford  omeprazole (PRILOSEC) 40 MG capsule Take 1 capsule (40 mg total) by mouth daily. 01/14/23   Joel Ford  rivaroxaban (XARELTO) 20 MG TABS tablet Take 1 tablet (20 mg total) by mouth daily with supper. 01/10/23   Joel Bunting, Ford  rosuvastatin (CRESTOR) 5 MG tablet Take 5 mg by mouth daily.    Provider, Historical, Ford  tiZANidine (ZANAFLEX) 4 MG tablet Take 0.5 tablets (2 mg total) by mouth every 8 (eight) hours as needed for muscle spasms. 12/13/21   Joel Ford  traZODone (DESYREL) 50 MG tablet TAKE 1/2 TO 1 TABLET BY MOUTH AT BEDTIME AS NEEDED FOR SLEEP 02/20/23   Joel Ford, [place]    Physical Exam    Vital Signs:  Joel Ford does not have vital signs available for review today.  Given telephonic nature of communication, physical exam is limited. AAOx3. NAD. Normal affect.  Speech and respirations are unlabored.  Accessory Clinical Findings    None  Assessment & Plan    1.  Preoperative Cardiovascular Risk Assessment:   Joel Ford's perioperative risk of a major cardiac event is 0.4% according  to the Revised Cardiac Risk Index (RCRI).  Therefore, he is at low risk for perioperative complications.   His functional capacity is good at 6.61 METs according to the Duke Activity Status Index (DASI). Recommendations: According to ACC/AHA guidelines, no further cardiovascular testing needed.  The patient may proceed to surgery at acceptable risk.   Antiplatelet and/or Anticoagulation Recommendations:  Xarelto (Rivaroxaban) can be held for 1-2 days prior to surgery.  Please resume post op when felt to be safe.     Time:   Today, I have spent 5 minutes with the patient with telehealth technology discussing medical history, symptoms, and management plan.     Sharlene Dory, Ford  03/24/2023, 8:03 AM

## 2023-03-31 ENCOUNTER — Other Ambulatory Visit: Payer: Self-pay | Admitting: Physician Assistant

## 2023-03-31 DIAGNOSIS — E039 Hypothyroidism, unspecified: Secondary | ICD-10-CM

## 2023-03-31 DIAGNOSIS — I480 Paroxysmal atrial fibrillation: Secondary | ICD-10-CM

## 2023-04-27 ENCOUNTER — Other Ambulatory Visit: Payer: Self-pay | Admitting: Physician Assistant

## 2023-04-29 DIAGNOSIS — M1009 Idiopathic gout, multiple sites: Secondary | ICD-10-CM | POA: Diagnosis not present

## 2023-04-29 DIAGNOSIS — M1991 Primary osteoarthritis, unspecified site: Secondary | ICD-10-CM | POA: Diagnosis not present

## 2023-04-29 DIAGNOSIS — Z79899 Other long term (current) drug therapy: Secondary | ICD-10-CM | POA: Diagnosis not present

## 2023-04-29 DIAGNOSIS — E663 Overweight: Secondary | ICD-10-CM | POA: Diagnosis not present

## 2023-04-29 DIAGNOSIS — Z6827 Body mass index (BMI) 27.0-27.9, adult: Secondary | ICD-10-CM | POA: Diagnosis not present

## 2023-04-30 ENCOUNTER — Encounter: Payer: Self-pay | Admitting: Physician Assistant

## 2023-05-01 NOTE — Telephone Encounter (Signed)
Thank you so much for checking on this. Yes. Please send to my attention.

## 2023-05-01 NOTE — Telephone Encounter (Signed)
COMPANY [employer] HAS PT PAGES  FOR LINZESS JUST WAITING ON PROVIDER PAGES TO BE SENT BACK FOR PAP PROCESSING    PLEASE BE ADVISED

## 2023-05-01 NOTE — Telephone Encounter (Signed)
RE FAXED TO PROVIDER  for LINZESS([employer]) TO PROVIDER OFFICE OF JADE, BREEBACK.   PLEASE BE ADVISED

## 2023-05-05 NOTE — Telephone Encounter (Signed)
Attempted call to patient. Left a voice mail message requesting a return call.  

## 2023-05-06 ENCOUNTER — Other Ambulatory Visit: Payer: Self-pay

## 2023-05-06 DIAGNOSIS — E039 Hypothyroidism, unspecified: Secondary | ICD-10-CM

## 2023-05-06 NOTE — Telephone Encounter (Signed)
Left detailed voice mail message on patient listed home # ( allowed on DPR)

## 2023-05-06 NOTE — Telephone Encounter (Signed)
Form received and faxed back to pt assistance department.

## 2023-05-12 DIAGNOSIS — Z0100 Encounter for examination of eyes and vision without abnormal findings: Secondary | ICD-10-CM | POA: Diagnosis not present

## 2023-05-12 DIAGNOSIS — Z961 Presence of intraocular lens: Secondary | ICD-10-CM | POA: Diagnosis not present

## 2023-05-12 DIAGNOSIS — H35363 Drusen (degenerative) of macula, bilateral: Secondary | ICD-10-CM | POA: Diagnosis not present

## 2023-05-12 DIAGNOSIS — E039 Hypothyroidism, unspecified: Secondary | ICD-10-CM | POA: Diagnosis not present

## 2023-05-12 DIAGNOSIS — H524 Presbyopia: Secondary | ICD-10-CM | POA: Diagnosis not present

## 2023-05-12 DIAGNOSIS — H43823 Vitreomacular adhesion, bilateral: Secondary | ICD-10-CM | POA: Diagnosis not present

## 2023-05-13 ENCOUNTER — Other Ambulatory Visit: Payer: Self-pay | Admitting: Physician Assistant

## 2023-05-13 LAB — TSH: TSH: 4.56 u[IU]/mL — ABNORMAL HIGH (ref 0.450–4.500)

## 2023-05-13 MED ORDER — LEVOTHYROXINE SODIUM 88 MCG PO TABS: 88.0000 ug | ORAL_TABLET | Freq: Every day | ORAL | 0 refills | Status: DC

## 2023-05-13 NOTE — Telephone Encounter (Signed)
PAP: Application for Joel Ford has been submitted to PAP Companies: [employer], via fax   PLEASE BE ADVISED RECEIVE PROVIDER PAGES  PT SENT HIS APPLICATION TO COMPANY AND THEY WAS MISSING PROVIDER PAGES.

## 2023-05-13 NOTE — Progress Notes (Signed)
MUCH better TSH! Sent one more little increase take for 3 months then lets recheck labs.

## 2023-05-14 ENCOUNTER — Encounter: Payer: Self-pay | Admitting: Physician Assistant

## 2023-05-14 MED ORDER — ROSUVASTATIN CALCIUM 5 MG PO TABS: 5.0000 mg | ORAL_TABLET | Freq: Every day | ORAL | 3 refills | Status: DC

## 2023-05-19 NOTE — Progress Notes (Signed)
   05/19/2023  Patient ID: Joel Ford, male   DOB: [birth year], 72 y.o.   MRN: [id number]  Contacted [employer] PAP to check on patient assistance application for Linzess daily.  Application has been received and is processing; they state a decision should be known by Friday, and they will email the patient to inform.  Sending a MyChart message to make [name] aware of this information.  Lenna Gilford, PharmD, DPLA

## 2023-05-27 NOTE — Progress Notes (Signed)
   05/27/2023  Patient ID: Joel Ford, male   DOB: Apr 19, 1951, 72 y.o.   MRN: [id number]  Received in basket message from patient stating Denyse Amass is needing a copy of patient's insurance card for processing of PAP application for Linzess.  This has been faxed to the company and patient informed.  Lenna Gilford, PharmD, DPLA

## 2023-05-30 ENCOUNTER — Other Ambulatory Visit: Payer: Self-pay | Admitting: Physician Assistant

## 2023-06-10 NOTE — Telephone Encounter (Signed)
PAP: Patient assistance application for Joel Ford has been approved by PAP Companies: [employer] from 05/30/2023 to 07/07/2024. Medication should be delivered to PAP Delivery: Home For further shipping updates, please contact [employer] ([employer]) at [phone] Pt ID is: NO ID   PLEASE BE ADVISED

## 2023-06-10 NOTE — Progress Notes (Signed)
Pharmacy Medication Assistance Program Note    06/10/2023  Patient ID: Joel Ford, male   DOB: 1951/03/19, 72 y.o.   MRN: [id number]     06/10/2023  Outreach Medication One  Initial Outreach Date (Medication One) 02/13/2023  Manufacturer Medication One [name] Drugs Linzess  Dose of Linzess 145MG   Type of Assistance Manufacturer Assistance  Date Application Sent to Patient 02/13/2023  Application Items Requested Application;Proof of Income  Date Application Sent to Prescriber 05/01/2023  Name of Prescriber Tandy Gaw  Method Application Sent to Manufacturer Fax  Patient Assistance Determination Approved  Approval Start Date 05/30/2023  Approval End Date 07/07/2024       Signature

## 2023-06-27 NOTE — Telephone Encounter (Signed)
Crenshaw's patient, [facility] office

## 2023-07-07 ENCOUNTER — Other Ambulatory Visit: Payer: Self-pay | Admitting: Physician Assistant

## 2023-07-07 DIAGNOSIS — I480 Paroxysmal atrial fibrillation: Secondary | ICD-10-CM

## 2023-07-18 ENCOUNTER — Ambulatory Visit: Payer: Medicare HMO | Admitting: Physician Assistant

## 2023-07-25 ENCOUNTER — Encounter: Payer: Self-pay | Admitting: Physician Assistant

## 2023-07-25 ENCOUNTER — Ambulatory Visit (INDEPENDENT_AMBULATORY_CARE_PROVIDER_SITE_OTHER): Payer: Self-pay | Admitting: Physician Assistant

## 2023-07-25 VITALS — BP 123/63 | HR 63 | Ht 70.0 in | Wt 191.0 lb

## 2023-07-25 DIAGNOSIS — I350 Nonrheumatic aortic (valve) stenosis: Secondary | ICD-10-CM

## 2023-07-25 DIAGNOSIS — D508 Other iron deficiency anemias: Secondary | ICD-10-CM

## 2023-07-25 DIAGNOSIS — I10 Essential (primary) hypertension: Secondary | ICD-10-CM

## 2023-07-25 DIAGNOSIS — K5901 Slow transit constipation: Secondary | ICD-10-CM

## 2023-07-25 DIAGNOSIS — E039 Hypothyroidism, unspecified: Secondary | ICD-10-CM | POA: Diagnosis not present

## 2023-07-25 DIAGNOSIS — D696 Thrombocytopenia, unspecified: Secondary | ICD-10-CM

## 2023-07-25 DIAGNOSIS — R011 Cardiac murmur, unspecified: Secondary | ICD-10-CM | POA: Insufficient documentation

## 2023-07-25 DIAGNOSIS — F5101 Primary insomnia: Secondary | ICD-10-CM | POA: Insufficient documentation

## 2023-07-25 DIAGNOSIS — I480 Paroxysmal atrial fibrillation: Secondary | ICD-10-CM

## 2023-07-25 MED ORDER — ZOLPIDEM TARTRATE 5 MG PO TABS: 5.0000 mg | ORAL_TABLET | Freq: Every evening | ORAL | 0 refills | Status: DC | PRN

## 2023-07-25 NOTE — Patient Instructions (Signed)
Zolpidem Tablets What is this medication? ZOLPIDEM (zole PI dem) treats insomnia. It helps you get to sleep faster and stay asleep throughout the night. It is often used for a short period of time. This medicine may be used for other purposes; ask your health care provider or pharmacist if you have questions. COMMON BRAND NAME(S): Ambien What should I tell my care team before I take this medication? They need to know if you have any of these conditions: Depression Frequently drink alcohol Liver disease Lung or breathing disease Myasthenia gravis Sleep apnea Substance use disorder Suicidal thoughts, plans, or attempt by you or a family member Unusual sleep behaviors or activities you do not remember An unusual or allergic reaction to zolpidem, other medications, foods, dyes, or preservatives Pregnant or trying to get pregnant Breastfeeding How should I use this medication? Take this medication by mouth with water. Take it as directed on the prescription label. It is better to take this medication on an empty stomach and only when you are ready for bed. Do not take your medication more often than directed. If you have been taking this medication for several weeks and suddenly stop taking it, you may get unpleasant withdrawal symptoms. Your care team may want to gradually reduce the dose. Do not stop taking this medication on your own. Always follow your care team's advice. A special MedGuide will be given to you by the pharmacist with each prescription and refill. Be sure to read this information carefully each time. Talk to your care team about the use of this medication in children. Special care may be needed. Overdosage: If you think you have taken too much of this medicine contact a poison control center or emergency room at once. NOTE: This medicine is only for you. Do not share this medicine with others. What if I miss a dose? This does not apply. This medication should only be taken  immediately before going to sleep. Do not take double or extra doses. What may interact with this medication? Alcohol Antihistamines for allergy, cough, and cold Certain medications for anxiety or sleep Certain medications for depression, such as amitriptyline, fluoxetine, sertraline Certain medications for fungal infections, such as ketoconazole and itraconazole Certain medications for seizures, such as phenobarbital, primidone Ciprofloxacin Dietary supplements for sleep, such as valerian or kava kava General anesthetics, such as halothane, isoflurane, methoxyflurane, propofol Local anesthetics, such as lidocaine, pramoxine, tetracaine Medications that relax muscles for surgery Opioid medications for pain Phenothiazines, such as chlorpromazine, mesoridazine, prochlorperazine, thioridazine Rifampin This list may not describe all possible interactions. Give your health care provider a list of all the medicines, herbs, non-prescription drugs, or dietary supplements you use. Also tell them if you smoke, drink alcohol, or use illegal drugs. Some items may interact with your medicine. What should I watch for while using this medication? Visit your care team for regular checks on your progress. Keep a regular sleep schedule by going to bed at about the same time each night. Avoid caffeine-containing drinks in the evening hours. When sleep medications are used every night for more than a few weeks, they may stop working. Talk to your care team if you still have trouble sleeping. You may do unusual sleep behaviors or activities you do not remember the day after taking this medication. Activities include driving, making or eating food, talking on the phone, sexual activity, or sleep walking. Stop taking this medication and call your care team right away if you find out you have done activities  like this. Plan to go to bed and stay in bed for a full night (7 to 8 hours) after you take this medication. You  may still be drowsy the morning after taking this medication. This medication may affect your coordination, reaction time, or judgment. Do not drive or operate machinery until you know how this medication affects you. Sit up or stand slowly to reduce the risk of dizzy or fainting spells. If you or your family notice any changes in your behavior, such as new or worsening depression, thoughts of harming yourself, anxiety, other unusual or disturbing thoughts, or memory loss, call your care team right away. After you stop taking this medication, you may have trouble falling asleep. This is called rebound insomnia. This problem usually goes away on its own after 1 or 2 nights. What side effects may I notice from receiving this medication? Side effects that you should report to your care team as soon as possible: Allergic reactions--skin rash, itching, hives, swelling of the face, lips, tongue, or throat Change in vision such as blurry vision, seeing halos around lights, vision loss CNS depression--slow or shallow breathing, shortness of breath, feeling faint, dizziness, confusion, difficulty staying awake Mood and behavior changes--anxiety, nervousness, confusion, hallucinations, irritability, hostility, thoughts of suicide or self-harm, worsening mood, feelings of depression Unusual sleep behaviors or activities you do not remember such as driving, eating, or sexual activity Side effects that usually do not require medical attention (report to your care team if they continue or are bothersome): Diarrhea Dizziness Drowsiness the day after use Headache This list may not describe all possible side effects. Call your doctor for medical advice about side effects. You may report side effects to FDA at 1-800-FDA-1088. Where should I keep my medication? Keep out of the reach of children and pets. This medication can be abused. Keep your medication in a safe place to protect it from theft. Do not share this  medication with anyone. Selling or giving away this medication is dangerous and against the law. Store at room temperature between 20 and 25 degrees C (68 and 77 degrees F). This medication may cause accidental overdose and death if taken by other adults, children, or pets. Mix any unused medication with a substance like cat litter or coffee grounds. Then throw the medication away in a sealed container like a sealed bag or a coffee can with a lid. Do not use the medication after the expiration date. NOTE: This sheet is a summary. It may not cover all possible information. If you have questions about this medicine, talk to your doctor, pharmacist, or health care provider.  2024 Elsevier/Gold Standard (2022-06-23 00:00:00)

## 2023-07-25 NOTE — Progress Notes (Signed)
Established Patient Office Visit  Subjective   Patient ID: Joel Ford, male    DOB: 05-Dec-1950  Age: 73 y.o. MRN: [id number]  Chief Complaint  Patient presents with   Medical Management of Chronic Issues    HPI Pt is a 73 yo male with PAF, HTN, constipation, hypothyroidism, insomnia and IDA who presents to the clinic for follow up.   He is compliant on medication. He is doing great with linzess and having well formed bowel movements. He is taking his iron daily. He is doing good with energy. Denies any palpitations, CP, headaches or vision changes.   He reports trazodone did not help at all with sleep. He would like to try something else.     Active Ambulatory Problems    Diagnosis Date Noted   Hyperlipidemia 07/04/2009   GOUT 01/22/2008   EXTERNAL HEMORRHOIDS 12/26/2009   RENAL CALCULUS 02/15/2008   Thoracic or lumbosacral neuritis or radiculitis 02/15/2008   CHEST WALL PAIN, ACUTE 08/30/2008   HYPERGLYCEMIA 07/27/2009   ABNORMAL ELECTROCARDIOGRAM 07/04/2009   Arthritis of knee, degenerative 08/30/2012   Preop cardiovascular exam 09/06/2012   Gout    Left knee DJD    S/P total knee replacement 08/07/2009   PAF (paroxysmal atrial fibrillation) (HCC) 08/05/2016   New onset atrial fibrillation (HCC) 08/06/2016   B12 deficiency anemia 08/06/2016   Anemia, iron deficiency 08/06/2016   Hypothyroidism 08/06/2016   Health care maintenance 09/03/2016   Essential hypertension 03/03/2017   Tick bite 11/19/2018   Leukopenia 12/19/2021   Normocytic anemia 12/19/2021   Thrombocytopenia (HCC) 12/19/2021   Acute left-sided low back pain without sciatica 12/19/2021   Folate deficiency 12/21/2021   DDD (degenerative disc disease), lumbar 12/21/2021   Primary localized osteoarthrosis of multiple sites 07/18/2022   Hemorrhoids 07/19/2022   Colon cancer screening 07/19/2022   Slow transit constipation 07/19/2022   Internal hemorrhoids 07/19/2022   Constipation 10/01/2022    Umbilical hernia without obstruction and without gangrene 10/01/2022   GIB (gastrointestinal bleeding) 10/02/2022   Anemia 10/02/2022   Elevated random blood glucose level 10/14/2022   Elevated TSH 10/14/2022   B12 deficiency 10/14/2022   History of GI bleed 01/14/2023   Primary insomnia 07/25/2023   Heart murmur 07/25/2023   Resolved Ambulatory Problems    Diagnosis Date Noted   Cellulitis and abscess of upper arm and forearm 06/18/2007   HEEL PAIN, RIGHT 11/10/2006   Past Medical History:  Diagnosis Date   Atrial fibrillation (HCC)    Esophageal varices (HCC)    Gastric polyps    HYPERLIPIDEMIA 07/04/2009   Portal hypertensive gastropathy (HCC)    Thyroid disease      Review of Systems  All other systems reviewed and are negative.     Objective:     BP 123/63   Pulse 63   Ht 5\' 10"  (1.778 m)   Wt 191 lb (86.6 kg)   SpO2 98%   BMI 27.41 kg/m  BP Readings from Last 3 Encounters:  07/25/23 123/63  01/14/23 134/72  11/04/22 (!) 120/58   Wt Readings from Last 3 Encounters:  07/25/23 191 lb (86.6 kg)  01/14/23 185 lb (83.9 kg)  11/04/22 188 lb 6.4 oz (85.5 kg)      Physical Exam Constitutional:      Appearance: Normal appearance.  HENT:     Head: Normocephalic.  Cardiovascular:     Rate and Rhythm: Normal rate and regular rhythm.     Heart sounds: Murmur heard.  Comments: 4/5 SEM. Pulmonary:     Effort: Pulmonary effort is normal.     Breath sounds: Normal breath sounds.  Neurological:     General: No focal deficit present.     Mental Status: He is alert and oriented to person, place, and time.  Psychiatric:        Mood and Affect: Mood normal.        Assessment & Plan:  Marland KitchenMarland KitchenNedra Hai "Jerolyn Shin" was seen today for medical management of chronic issues.  Diagnoses and all orders for this visit:  Essential hypertension -     CMP14+EGFR  Other iron deficiency anemia -     Fe+TIBC+Fer -     CBC w/Diff/Platelet  Thrombocytopenia (HCC) -      Fe+TIBC+Fer -     CBC w/Diff/Platelet  Slow transit constipation -     CMP14+EGFR  PAF (paroxysmal atrial fibrillation) (HCC)  Hypothyroidism, unspecified type -     TSH + free T4  Primary insomnia -     zolpidem (AMBIEN) 5 MG tablet; Take 1 tablet (5 mg total) by mouth at bedtime as needed for sleep.  Nonrheumatic aortic valve stenosis   Needs labs for follow up on medications Stop trazodone, trial of 5mg  of ambien for sleep Discussed SE and HO given Vitals look great today Continue linzess for constipation Will adjust medications accordingly    Return in about 6 months (around 01/22/2024).    Tandy Gaw, PA-C

## 2023-07-26 LAB — CBC WITH DIFFERENTIAL/PLATELET
Basophils Absolute: 0 10*3/uL (ref 0.0–0.2)
Basos: 1 %
EOS (ABSOLUTE): 0.2 10*3/uL (ref 0.0–0.4)
Eos: 4 %
Hematocrit: 42.2 % (ref 37.5–51.0)
Hemoglobin: 13.3 g/dL (ref 13.0–17.7)
Immature Grans (Abs): 0 10*3/uL (ref 0.0–0.1)
Immature Granulocytes: 0 %
Lymphocytes Absolute: 0.5 10*3/uL — ABNORMAL LOW (ref 0.7–3.1)
Lymphs: 11 %
MCH: 29.2 pg (ref 26.6–33.0)
MCHC: 31.5 g/dL (ref 31.5–35.7)
MCV: 93 fL (ref 79–97)
Monocytes Absolute: 0.4 10*3/uL (ref 0.1–0.9)
Monocytes: 9 %
Neutrophils Absolute: 3.2 10*3/uL (ref 1.4–7.0)
Neutrophils: 75 %
Platelets: 93 10*3/uL — CL (ref 150–450)
RBC: 4.56 x10E6/uL (ref 4.14–5.80)
RDW: 13.9 % (ref 11.6–15.4)
WBC: 4.3 10*3/uL (ref 3.4–10.8)

## 2023-07-26 LAB — CMP14+EGFR
ALT: 9 [IU]/L (ref 0–44)
AST: 16 [IU]/L (ref 0–40)
Albumin: 4.3 g/dL (ref 3.8–4.8)
Alkaline Phosphatase: 102 [IU]/L (ref 44–121)
BUN/Creatinine Ratio: 14 (ref 10–24)
BUN: 14 mg/dL (ref 8–27)
Bilirubin Total: 0.6 mg/dL (ref 0.0–1.2)
CO2: 25 mmol/L (ref 20–29)
Calcium: 8.9 mg/dL (ref 8.6–10.2)
Chloride: 105 mmol/L (ref 96–106)
Creatinine, Ser: 1 mg/dL (ref 0.76–1.27)
Globulin, Total: 2 g/dL (ref 1.5–4.5)
Glucose: 116 mg/dL — ABNORMAL HIGH (ref 70–99)
Potassium: 3.7 mmol/L (ref 3.5–5.2)
Sodium: 145 mmol/L — ABNORMAL HIGH (ref 134–144)
Total Protein: 6.3 g/dL (ref 6.0–8.5)
eGFR: 80 mL/min/{1.73_m2} (ref >59)

## 2023-07-26 LAB — IRON,TIBC AND FERRITIN PANEL
Ferritin: 41 ng/mL (ref 30–400)
Iron Saturation: 17 % (ref 15–55)
Iron: 58 ug/dL (ref 38–169)
Total Iron Binding Capacity: 338 ug/dL (ref 250–450)
UIBC: 280 ug/dL (ref 111–343)

## 2023-07-26 LAB — TSH+FREE T4
Free T4: 1.16 ng/dL (ref 0.82–1.77)
TSH: 8.82 u[IU]/mL — ABNORMAL HIGH (ref 0.450–4.500)

## 2023-07-28 ENCOUNTER — Other Ambulatory Visit: Payer: Self-pay | Admitting: Physician Assistant

## 2023-07-28 ENCOUNTER — Encounter: Payer: Self-pay | Admitting: Physician Assistant

## 2023-07-28 MED ORDER — LEVOTHYROXINE SODIUM 100 MCG PO TABS: 100.0000 ug | ORAL_TABLET | Freq: Every day | ORAL | 0 refills | Status: DC

## 2023-07-28 NOTE — Progress Notes (Signed)
Joel Ford,   Hemoglobin looks GREAT! Iron and Iron stores wonderful.   Thyroid is looking to be on the hypo(low) thyroid side. I am going to increase levothyroxine to . Recheck labs in 3 months.   Platelets stable but still low.

## 2023-08-09 ENCOUNTER — Other Ambulatory Visit: Payer: Self-pay | Admitting: Physician Assistant

## 2023-08-13 ENCOUNTER — Other Ambulatory Visit: Payer: Self-pay | Admitting: Physician Assistant

## 2023-08-13 DIAGNOSIS — I480 Paroxysmal atrial fibrillation: Secondary | ICD-10-CM

## 2023-08-27 ENCOUNTER — Other Ambulatory Visit: Payer: Self-pay | Admitting: Physician Assistant

## 2023-08-27 DIAGNOSIS — F5101 Primary insomnia: Secondary | ICD-10-CM

## 2023-08-28 ENCOUNTER — Other Ambulatory Visit: Payer: Self-pay | Admitting: Physician Assistant

## 2023-08-28 DIAGNOSIS — F5101 Primary insomnia: Secondary | ICD-10-CM

## 2023-08-29 ENCOUNTER — Other Ambulatory Visit: Payer: Self-pay

## 2023-08-29 ENCOUNTER — Other Ambulatory Visit: Payer: Self-pay | Admitting: Physician Assistant

## 2023-08-29 DIAGNOSIS — F5101 Primary insomnia: Secondary | ICD-10-CM

## 2023-08-29 NOTE — Telephone Encounter (Signed)
 Copied from CRM [id number]. Topic: Clinical - Medication Refill >> Aug 29, 2023  5:22 PM Santiya F wrote: Most Recent Primary Care Visit:  Provider: Jomarie Longs  Department: [facility]  Visit Type: OFFICE VISIT  Date: 07/25/2023  Medication: zolpidem (AMBIEN) 5 MG tablet   Has the patient contacted their pharmacy? Yes  (Agent: If yes, when and what did the pharmacy advise?) Contact Office, pharmacy requested more than once.   Is this the correct pharmacy for this prescription? Yes  This is the patient's preferred pharmacy:  [facility] [phone] [facility] [place] - [address] [place] [address] Phone: [phone] Fax: [phone]   Has the prescription been filled recently? Yes  Is the patient out of the medication? Yes  Has the patient been seen for an appointment in the last year OR does the patient have an upcoming appointment? Yes  Can we respond through MyChart? Yes  Agent: Please be advised that Rx refills may take up to 3 business days. We ask that you follow-up with your pharmacy.

## 2023-08-29 NOTE — Telephone Encounter (Signed)
 Last Fill: 07/25/23 30 tabs/0 refill  Last OV: 07/25/23 Next OV: None Scheduled  Routing to provider for review/authorization.

## 2023-08-29 NOTE — Telephone Encounter (Signed)
 Spouse LM requesting Ambien refill status. Advised spouse medication was sent to pharmacy on 08/25/23. Spouse will contact the pharmacy directly.

## 2023-08-29 NOTE — Telephone Encounter (Signed)
 Requesting rx rf of ambien 5mg  Last written 07/25/2023 Last OV 07/25/2023 Upcoming appt = none

## 2023-09-01 ENCOUNTER — Encounter: Payer: Self-pay | Admitting: Physician Assistant

## 2023-09-01 MED ORDER — ZOLPIDEM TARTRATE 5 MG PO TABS: 5.0000 mg | ORAL_TABLET | Freq: Every evening | ORAL | 0 refills | Status: DC | PRN

## 2023-09-15 ENCOUNTER — Other Ambulatory Visit: Payer: Self-pay | Admitting: Physician Assistant

## 2023-09-15 DIAGNOSIS — I480 Paroxysmal atrial fibrillation: Secondary | ICD-10-CM

## 2023-10-14 ENCOUNTER — Other Ambulatory Visit: Payer: Self-pay | Admitting: Physician Assistant

## 2023-10-14 ENCOUNTER — Other Ambulatory Visit: Payer: Self-pay | Admitting: Cardiovascular Disease

## 2023-10-14 DIAGNOSIS — I480 Paroxysmal atrial fibrillation: Secondary | ICD-10-CM

## 2023-10-14 DIAGNOSIS — Z7901 Long term (current) use of anticoagulants: Secondary | ICD-10-CM

## 2023-10-14 DIAGNOSIS — F5101 Primary insomnia: Secondary | ICD-10-CM

## 2023-10-14 NOTE — Telephone Encounter (Signed)
 Xarelto 20mg  refill request received. Pt is 73 years old, weight-86.6kg, Crea-1.0 on 07/25/23, last seen by Jari Favre on 03/24/23 via TeleVisit, Diagnosis-Afib, CrCl-81.79 mL/min; Dose is appropriate based on dosing criteria. Will send in refill to requested pharmacy.

## 2023-10-24 ENCOUNTER — Emergency Department (HOSPITAL_BASED_OUTPATIENT_CLINIC_OR_DEPARTMENT_OTHER)
Admission: EM | Admit: 2023-10-24 | Discharge: 2023-10-25 | Disposition: A | Discharge status: Home / Self Care | Attending: Emergency Medicine | Admitting: Emergency Medicine

## 2023-10-24 ENCOUNTER — Emergency Department (HOSPITAL_BASED_OUTPATIENT_CLINIC_OR_DEPARTMENT_OTHER)

## 2023-10-24 ENCOUNTER — Encounter (HOSPITAL_BASED_OUTPATIENT_CLINIC_OR_DEPARTMENT_OTHER): Payer: Self-pay | Admitting: Emergency Medicine

## 2023-10-24 ENCOUNTER — Other Ambulatory Visit: Payer: Self-pay

## 2023-10-24 DIAGNOSIS — S81851A Open bite, right lower leg, initial encounter: Secondary | ICD-10-CM | POA: Insufficient documentation

## 2023-10-24 DIAGNOSIS — T63001A Toxic effect of unspecified snake venom, accidental (unintentional), initial encounter: Secondary | ICD-10-CM | POA: Diagnosis not present

## 2023-10-24 DIAGNOSIS — W5911XA Bitten by nonvenomous snake, initial encounter: Secondary | ICD-10-CM | POA: Insufficient documentation

## 2023-10-24 DIAGNOSIS — I10 Essential (primary) hypertension: Secondary | ICD-10-CM | POA: Insufficient documentation

## 2023-10-24 DIAGNOSIS — Z23 Encounter for immunization: Secondary | ICD-10-CM | POA: Diagnosis not present

## 2023-10-24 DIAGNOSIS — Z7901 Long term (current) use of anticoagulants: Secondary | ICD-10-CM | POA: Insufficient documentation

## 2023-10-24 DIAGNOSIS — Z96651 Presence of right artificial knee joint: Secondary | ICD-10-CM | POA: Diagnosis not present

## 2023-10-24 LAB — COMPREHENSIVE METABOLIC PANEL WITH GFR
ALT: 11 U/L (ref 0–44)
AST: 20 U/L (ref 15–41)
Albumin: 3.7 g/dL (ref 3.5–5.0)
Alkaline Phosphatase: 82 U/L (ref 38–126)
Anion gap: 7 (ref 5–15)
BUN: 16 mg/dL (ref 8–23)
CO2: 28 mmol/L (ref 22–32)
Calcium: 8.6 mg/dL — ABNORMAL LOW (ref 8.9–10.3)
Chloride: 105 mmol/L (ref 98–111)
Creatinine, Ser: 0.97 mg/dL (ref 0.61–1.24)
GFR, Estimated: >60 mL/min (ref >60)
Glucose, Bld: 133 mg/dL — ABNORMAL HIGH (ref 70–99)
Potassium: 3.9 mmol/L (ref 3.5–5.1)
Sodium: 140 mmol/L (ref 135–145)
Total Bilirubin: 0.6 mg/dL (ref 0.0–1.2)
Total Protein: 6.8 g/dL (ref 6.5–8.1)

## 2023-10-24 LAB — APTT: aPTT: 43 s — ABNORMAL HIGH (ref 24–36)

## 2023-10-24 NOTE — ED Provider Notes (Signed)
 [facility] Provider Note   CSN: [id number] Arrival date & time: 10/24/23  2200     History  Chief Complaint  Patient presents with   Snake Bite    Joel Ford is a 73 y.o. male.  Patient had about 2110 was outside taking trash out when he felt a sharp stabbing sensation on his right lower leg.  He looked down and there was bleeding.  Patient unable to identify or visualize a snake with there are 2 puncture wounds.  No swelling no redness no significant pain which is reassuring.  Patient's past medical history significant for hyperlipidemia atrial fibrillation on Xarelto  portal hypertension esophageal varices thyroid  disease past surgical history significant for appendectomy right and left total knee replacements.  Upper endoscopy by Dr. Anthoney Kipper in March 2024.  With hemostasis clip placed.  This must have been for GI bleed.  Patient is never smoked tobacco but does chew.  Patient last seen in March 2024 for upper GI bleed.       Home Medications Prior to Admission medications   Medication Sig Start Date End Date Taking? Authorizing Provider  allopurinol  (ZYLOPRIM ) 300 MG tablet Take 300 mg by mouth daily. 01/27/19   [provider]  cyanocobalamin  (VITAMIN B12) 1000 MCG tablet Take 1 tablet (1,000 mcg total) by mouth daily. 10/04/22   Lonita Roach, MD  ferrous sulfate  325 (65 FE) MG EC tablet TAKE 1 TABLET (325 MG TOTAL) BY MOUTH DAILY WITH BREAKFAST. NEED LABS 07/19/22   Breeback, Jade L, PA-C  levothyroxine  (SYNTHROID ) 100 MCG tablet Take 1 tablet (100 mcg total) by mouth daily. 07/28/23   Breeback, Jade L, PA-C  levothyroxine  (SYNTHROID ) 50 MCG tablet TAKE 1 TABLET BY MOUTH EVERY DAY BEFORE BREAKFAST 10/15/23   Breeback, Jade L, PA-C  levothyroxine  (SYNTHROID ) 75 MCG tablet TAKE 1 TABLET BY MOUTH EVERY DAY BEFORE BREAKFAST 10/15/23   Breeback, Jade L, PA-C  lidocaine  (XYLOCAINE ) 5 % ointment Apply 1 Application topically 3 (three)  times daily as needed. Patient taking differently: Apply 1 Application topically 3 (three) times daily as needed for mild pain (pain score 1-3). 07/16/22   Breeback, Jade L, PA-C  linaclotide  (LINZESS ) 145 MCG CAPS capsule Take 1 capsule (145 mcg total) by mouth daily. 01/14/23   Breeback, Jade L, PA-C  metoprolol  tartrate (LOPRESSOR ) 25 MG tablet TAKE 1 TABLET BY MOUTH TWICE A DAY 10/14/23   Breeback, Jade L, PA-C  omeprazole  (PRILOSEC) 40 MG capsule Take 1 capsule (40 mg total) by mouth daily. 01/14/23   Breeback, Jade L, PA-C  rosuvastatin  (CRESTOR ) 5 MG tablet Take 1 tablet (5 mg total) by mouth at bedtime. 05/14/23   Cydney Draft, MD  tiZANidine  (ZANAFLEX ) 4 MG tablet Take 0.5 tablets (2 mg total) by mouth every 8 (eight) hours as needed for muscle spasms. 12/13/21   Dodson Freestone, FNP  XARELTO  20 MG TABS tablet TAKE 1 TABLET BY MOUTH DAILY WITH SUPPER. 10/14/23   Wenona Hamilton, MD  zolpidem  (AMBIEN ) 5 MG tablet Take 1 tablet (5 mg total) by mouth at bedtime as needed for sleep. 09/01/23   Breeback, Jade L, PA-C      Allergies    Patient has no known allergies.    Review of Systems   Review of Systems  Constitutional:  Negative for chills and fever.  HENT:  Negative for ear pain and sore throat.   Eyes:  Negative for pain and visual disturbance.  Respiratory:  Negative for cough and shortness of breath.   Cardiovascular:  Negative for chest pain and palpitations.  Gastrointestinal:  Negative for abdominal pain and vomiting.  Genitourinary:  Negative for dysuria and hematuria.  Musculoskeletal:  Negative for arthralgias and back pain.  Skin:  Positive for wound. Negative for color change and rash.  Neurological:  Negative for seizures and syncope.  All other systems reviewed and are negative.   Physical Exam Updated Vital Signs BP (!) 158/92 (BP Location: Left Arm)   Pulse 66   Temp 97.8 F (36.6 C)   Resp 18   Ht 1.803 m (5\' 11" )   Wt 95.3 kg   SpO2 100%   BMI 29.29 kg/m   Physical Exam Vitals and nursing note reviewed.  Constitutional:      General: He is not in acute distress.    Appearance: Normal appearance. He is well-developed.  HENT:     Head: Normocephalic and atraumatic.  Eyes:     Extraocular Movements: Extraocular movements intact.     Conjunctiva/sclera: Conjunctivae normal.     Pupils: Pupils are equal, round, and reactive to light.  Cardiovascular:     Rate and Rhythm: Normal rate and regular rhythm.     Heart sounds: No murmur heard. Pulmonary:     Effort: Pulmonary effort is normal. No respiratory distress.     Breath sounds: Normal breath sounds.  Abdominal:     Palpations: Abdomen is soft.     Tenderness: There is no abdominal tenderness.  Musculoskeletal:        General: No swelling.     Cervical back: Normal range of motion and neck supple.     Right lower leg: No edema.     Left lower leg: No edema.     Comments: Well-healed surgical scars to both knees.  The right lower extremity with 2 puncture wounds with some bleeding.  No erythema no tenderness.  Good cap refill to the foot dorsalis pedis pulses 2+.  No swelling to the foot or ankle either.  Skin:    General: Skin is warm and dry.     Capillary Refill: Capillary refill takes less than 2 seconds.  Neurological:     General: No focal deficit present.     Mental Status: He is alert and oriented to person, place, and time.  Psychiatric:        Mood and Affect: Mood normal.     ED Results / Procedures / Treatments   Labs (all labs ordered are listed, but only abnormal results are displayed) Labs Reviewed  CBC  FIBRINOGEN  PROTIME-INR  COMPREHENSIVE METABOLIC PANEL WITH GFR  APTT    EKG None  Radiology No results found.  Procedures Procedures    Medications Ordered in ED Medications - No data to display  ED Course/ Medical Decision Making/ A&P                                 Medical Decision Making Amount and/or Complexity of Data Reviewed Labs:  ordered. Radiology: ordered.   CRITICAL CARE Performed by: Amal Saiki Total critical care time: 45 minutes Critical care time was exclusive of separately billable procedures and treating other patients. Critical care was necessary to treat or prevent imminent or life-threatening deterioration. Critical care was time spent personally by me on the following activities: development of treatment plan with patient and/or surrogate as well as nursing, discussions  with consultants, evaluation of patient's response to treatment, examination of patient, obtaining history from patient or surrogate, ordering and performing treatments and interventions, ordering and review of laboratory studies, ordering and review of radiographic studies, pulse oximetry and re-evaluation of patient's condition.   Patient with 2 puncture wounds consistent with fang bites from snake.  Currently no signs of envenomation.  No significant pain no erythema no redness no significant swelling.  Poison control has been contacted.  Will get labs.  Have taken measurements of the leg.  Will start cardiac monitoring.  Will get EKG.   Final Clinical Impression(s) / ED Diagnoses Final diagnoses:  Snake bite, initial encounter    Rx / DC Orders ED Discharge Orders     None         Nicklas Barns, MD 10/24/23 2256

## 2023-10-24 NOTE — ED Triage Notes (Addendum)
 Patient reports possible snake bite occurred tonight around 2115. States he felt a sharp stabbing sensation on his right lower posterior leg, looked down and was bleeding. Unable to visualize the snake. Two puncture wounds noted upon assessment with dried blood. No redness. No swelling.

## 2023-10-24 NOTE — ED Notes (Addendum)
 Poison control contacted. Spoke with Danielle. Recommended:  Clean wound Elevate leg  Update tetanus Obs 8 hours  Circumferential measurements Q 1 h x 3 hours, then q 2 - 3 hours  @ 6 hour post-bite, draw Pt/inr, Cbc, fibrinogen

## 2023-10-25 MED ORDER — TETANUS-DIPHTH-ACELL PERTUSSIS 5-2.5-18.5 LF-MCG/0.5 IM SUSY
0.5000 mL | PREFILLED_SYRINGE | Freq: Once | INTRAMUSCULAR | Status: AC
  Administered 2023-10-25: 0.5 mL via INTRAMUSCULAR
  Filled 2023-10-25: qty 0.5

## 2023-10-25 NOTE — Discharge Instructions (Signed)
 Return to the emergency department if you develop increased pain, redness, swelling, or for other new and concerning symptoms.

## 2023-10-25 NOTE — ED Provider Notes (Signed)
  Physical Exam  BP 136/84   Pulse 63   Temp 97.8 F (36.6 C)   Resp 13   Ht 5\' 11"  (1.803 m)   Wt 95.3 kg   SpO2 100%   BMI 29.29 kg/m   Physical Exam Vitals and nursing note reviewed.  Constitutional:      Appearance: Normal appearance.  Pulmonary:     Effort: Pulmonary effort is normal.  Skin:    General: Skin is warm and dry.     Comments: The puncture wounds noted to the right lower leg are showing no signs of envenomation.  There is no swelling, no redness, no oozing, and no pain.  Neurological:     Mental Status: He is alert.     Procedures  Procedures  ED Course / MDM    Medical Decision Making Amount and/or Complexity of Data Reviewed Labs: ordered. Radiology: ordered.  Risk Prescription drug management.   Care assumed from Dr. Zackowski at shift change.  Patient presenting after concerns of a snake bite.  I have observed him for 3 hours and he is showing no signs of envenomation.  He is having no pain, no redness, and no bruising at the bite site.  The bite occurred at approximately 9 PM and it is now nearly 3 AM.  I feel as though he can safely be discharged.       Orvilla Blander, MD 10/25/23 [phone]

## 2023-11-08 ENCOUNTER — Other Ambulatory Visit: Payer: Self-pay | Admitting: Physician Assistant

## 2023-11-13 ENCOUNTER — Other Ambulatory Visit: Payer: Self-pay | Admitting: Physician Assistant

## 2023-11-21 ENCOUNTER — Encounter: Payer: Self-pay | Admitting: Physician Assistant

## 2023-11-21 ENCOUNTER — Telehealth: Payer: Self-pay

## 2023-11-21 ENCOUNTER — Ambulatory Visit (INDEPENDENT_AMBULATORY_CARE_PROVIDER_SITE_OTHER): Admitting: Physician Assistant

## 2023-11-21 VITALS — BP 122/87 | HR 76 | Temp 98.0°F | Ht 70.0 in | Wt 188.0 lb

## 2023-11-21 DIAGNOSIS — K5904 Chronic idiopathic constipation: Secondary | ICD-10-CM

## 2023-11-21 MED ORDER — LINACLOTIDE 290 MCG PO CAPS
290.0000 ug | ORAL_CAPSULE | Freq: Every day | ORAL | 3 refills | Status: AC
Start: 2023-11-21 — End: ?

## 2023-11-21 MED ORDER — LINACLOTIDE 290 MCG PO CAPS: 290.0000 ug | ORAL_CAPSULE | Freq: Every day | ORAL | 3 refills | Status: DC

## 2023-11-21 NOTE — Telephone Encounter (Signed)
 Faxed prescription

## 2023-11-21 NOTE — Patient Instructions (Signed)
 Chronic Constipation Chronic constipation is when a person poops 3 times a week or less for 3 months or longer. It is most common in older adults. What are the causes? This condition may be caused by: Not drinking enough fluid, eating enough fiber, or getting enough exercise. Pregnancy. An anal fissure. This is a tear in the opening of the butt (anus). Bowel obstruction. This is when there is a blockage in your intestine (bowel). Bowel stricture. This is when your bowel narrows. A long-term (chronic) condition, such as: Diabetes or hypothyroidism. A stroke or injury to the spinal cord. Multiple sclerosis or Parkinson's disease. Colon cancer. Dementia. Inflammatory bowel disease (IBD), rectal prolapse, or hemorrhoids. Taking certain medicines. These include: Some pain medicines. Antacids or iron supplements. Water pills (diuretics). Some blood pressure medicines. Medicines to keep you from having seizures (anti-seizure medicines). Antidepressants. Medicines for Parkinson's disease. Other causes may include: Stress. Problems with the nerves and muscles that help you poop. Weak muscles in the area between your hip bones (pelvis). What increases the risk? You may be more at risk for this condition if: You are older than 73 years of age. You are male. You live in a long-term care facility. You have a chronic condition. You have a mental health disorder or eating disorder. What are the signs or symptoms? The main sign of this condition is pooping 3 times a week or less. Other signs and symptoms may include: Pushing hard (straining) to poop. Hard or lumpy poop (stool). Pain when you poop. Discomfort in your lower abdomen. This may include cramps and bloating. Not being able to poop when you need to. Feeling like you still need to poop after you are done. Feeling like there is something in your rectum that is blocking or stopping you from pooping. Blood in your poop or seeing  blood on the toilet paper after you poop. Confusion. This is most common in older adults. How is this diagnosed? This condition may be diagnosed based on your symptoms and medical history. You may also need: An exam of your abdomen. A digital rectal exam. For this exam, your health care provider will put a gloved finger into your rectum. Testing. Tests may include: Imaging studies, such as an X-ray, ultrasound, or CT scan. Blood tests. A colonoscopy. This is a test that looks at your colon. A test of how well your anal sphincter works. This is a muscle shaped like a ring. It helps your anus open and close. A test of how well food moves through your colon. An electromyography. This is a test of the nerves in your pelvis. How is this treated? Treatment depends on the cause. You may need to: Be more active. Drink more fluids. Add fiber to your diet. Sources of fiber include fruits, vegetables, and whole grains. You may also need to take a fiber supplement. Stop or change medicines that can cause constipation. Take medicines, such as: Pro-motility agents. These help your muscles tighten (contract). This can help you push out poop. Stool softeners. These soften your poop. An osmotic laxative. A laxative is a medicine that helps you poop. This type works by [employer] into your colon. Train the muscles in your pelvis with biofeedback. Have surgery. This may be done if something is blocking your bowels. You may also need to see an expert in problems with the digestive system (gastroenterologist). Follow these instructions at home: Medicines Take over-the-counter and prescription medicines only as told by your provider. If you are taking  a laxative, take it only as told by your provider. Eating and drinking  Drink enough fluid to keep your pee (urine) pale yellow. Eat foods that are high in fiber, such as beans, whole grains, and fresh fruits and vegetables. Limit foods that are high  in fat and processed sugars, such as fried or sweet foods. Stay away from alcohol, caffeine, and soda. These can make constipation worse. General instructions Get physical activity every day. Ask your provider what activities are safe for you. Get colon cancer screenings as told by your provider. Contact a health care provider if: You poop 3 times a week or less. Your poop is hard or lumpy. There is blood on the toilet paper or in your poop. You lose weight for no clear reason. You have pain in your rectum. Poop is leaking out of your rectum. You have nausea or vomiting. This information is not intended to replace advice given to you by your health care provider. Make sure you discuss any questions you have with your health care provider. Document Revised: 04/08/2022 Document Reviewed: 04/08/2022 Elsevier Patient Education  2024 [employer].

## 2023-11-21 NOTE — Addendum Note (Signed)
 Addended by: Doretha Ganja on: 11/21/2023 01:10 PM   Modules accepted: Orders

## 2023-11-21 NOTE — Progress Notes (Signed)
   11/21/2023  Patient ID: Joel Ford, male   DOB: 1950/11/07, 73 y.o.   MRN: [id number]  Patient receives Linzess  through [facility], and dose was recently increased to 290mcg daily.  Collaborating with medication assistance team to update dosing with program.  Linn Rich, PharmD, DPLA

## 2023-11-21 NOTE — Progress Notes (Addendum)
   11/21/2023  Patient ID: Joel Ford, male   DOB: [birth year], 73 y.o.   MRN: [id number]  Per medication assistance team: To change/ dose increase simply fax a new Rx to [phone] for Linzess  approved patients.   Sending request to PCP/clinical staff to fax updated prescription to [facility].  Linn Rich, PharmD, DPLA

## 2023-11-21 NOTE — Progress Notes (Signed)
   Acute Office Visit  Subjective:     Patient ID: Joel Ford, male    DOB: May 08, 1951, 73 y.o.   MRN: [id number]  Chief Complaint  Patient presents with   Abdominal Pain    HPI Patient is in today for generalized abdominal pain and constipation for the last 2 weeks worsening. Pt does have chronic constipation.  Pt does take linzess  145mcg daily and had been working until recently. Pt does take oral iron at least twice a day. Pt denies any diet changes. Pt is not having any melena or hematochezia. Joel Ford has had some acid reflux with certain foods. Joel Ford has a bowel movement every 3-4 days but they are small and do not feel like Joel Ford is having a full bowel movement.    ROS See HPI.      Objective:    BP 122/87   Pulse 76   Temp 98 F (36.7 C) (Oral)   Ht 5\' 10"  (1.778 m)   Wt 188 lb (85.3 kg)   SpO2 99%   BMI 26.98 kg/m  BP Readings from Last 3 Encounters:  11/21/23 122/87  10/25/23 136/84  07/25/23 123/63   Wt Readings from Last 3 Encounters:  11/21/23 188 lb (85.3 kg)  10/24/23 210 lb (95.3 kg)  07/25/23 191 lb (86.6 kg)      Physical Exam Constitutional:      Appearance: Joel Ford is well-developed.  HENT:     Head: Normocephalic.  Cardiovascular:     Rate and Rhythm: Normal rate.  Abdominal:     General: Bowel sounds are increased.     Palpations: Abdomen is soft.     Tenderness: There is no abdominal tenderness.     Hernia: No hernia is present.  Neurological:     Mental Status: Joel Ford is alert and oriented to person, place, and time.  Psychiatric:        Mood and Affect: Mood normal.          Assessment & Plan:  Joel AasAaron AasMerlyn Ford "Joel Ford" was seen today for abdominal pain.  Diagnoses and all orders for this visit:  Chronic idiopathic constipation -     linaclotide  (LINZESS ) 290 MCG CAPS capsule; Take 1 capsule (290 mcg total) by mouth daily.   Hx of chronic constipation Increase linzess  to Increase water in diet Stop oral iron for next month and will check  labs and see how bowels are responding Follow up if symptoms worsening, no red flags today or in 3 months.   Joel Osgood, PA-C

## 2023-11-24 ENCOUNTER — Telehealth: Payer: Self-pay

## 2023-11-24 NOTE — Progress Notes (Signed)
   11/24/2023  Patient ID: Joel Ford, male   DOB: [birth year], 73 y.o.   MRN: [id number]  Dose increase for Linzess  faxed into [employer] [facility] Friday.  Contacted [employer] to verify this was received and is processing.  Order was received, and prescription went into processing today; representative states Linzess  should arrive at patient's home in the next 5-7 business days.  Sending patient a MyChart message to make him aware.  Linn Rich, PharmD, DPLA

## 2023-12-02 ENCOUNTER — Other Ambulatory Visit: Payer: Self-pay | Admitting: Physician Assistant

## 2023-12-02 DIAGNOSIS — F5101 Primary insomnia: Secondary | ICD-10-CM

## 2024-01-30 ENCOUNTER — Emergency Department (HOSPITAL_BASED_OUTPATIENT_CLINIC_OR_DEPARTMENT_OTHER)

## 2024-01-30 ENCOUNTER — Inpatient Hospital Stay (HOSPITAL_BASED_OUTPATIENT_CLINIC_OR_DEPARTMENT_OTHER)
Admission: EM | Admit: 2024-01-30 | Discharge: 2024-02-03 | DRG: 378 | Disposition: A | Discharge status: Home / Self Care | Attending: Internal Medicine | Admitting: Internal Medicine

## 2024-01-30 ENCOUNTER — Other Ambulatory Visit: Payer: Self-pay

## 2024-01-30 ENCOUNTER — Encounter (HOSPITAL_COMMUNITY): Payer: Self-pay | Admitting: Family Medicine

## 2024-01-30 DIAGNOSIS — I10 Essential (primary) hypertension: Secondary | ICD-10-CM | POA: Diagnosis not present

## 2024-01-30 DIAGNOSIS — R079 Chest pain, unspecified: Secondary | ICD-10-CM | POA: Diagnosis not present

## 2024-01-30 DIAGNOSIS — E538 Deficiency of other specified B group vitamins: Secondary | ICD-10-CM | POA: Diagnosis present

## 2024-01-30 DIAGNOSIS — R7989 Other specified abnormal findings of blood chemistry: Secondary | ICD-10-CM

## 2024-01-30 DIAGNOSIS — D649 Anemia, unspecified: Secondary | ICD-10-CM | POA: Diagnosis not present

## 2024-01-30 DIAGNOSIS — R195 Other fecal abnormalities: Secondary | ICD-10-CM | POA: Diagnosis not present

## 2024-01-30 DIAGNOSIS — F17220 Nicotine dependence, chewing tobacco, uncomplicated: Secondary | ICD-10-CM | POA: Diagnosis present

## 2024-01-30 DIAGNOSIS — D696 Thrombocytopenia, unspecified: Secondary | ICD-10-CM

## 2024-01-30 DIAGNOSIS — E785 Hyperlipidemia, unspecified: Secondary | ICD-10-CM | POA: Diagnosis present

## 2024-01-30 DIAGNOSIS — D500 Iron deficiency anemia secondary to blood loss (chronic): Secondary | ICD-10-CM | POA: Diagnosis present

## 2024-01-30 DIAGNOSIS — D509 Iron deficiency anemia, unspecified: Secondary | ICD-10-CM | POA: Diagnosis not present

## 2024-01-30 DIAGNOSIS — K3189 Other diseases of stomach and duodenum: Secondary | ICD-10-CM | POA: Diagnosis present

## 2024-01-30 DIAGNOSIS — I480 Paroxysmal atrial fibrillation: Secondary | ICD-10-CM | POA: Diagnosis present

## 2024-01-30 DIAGNOSIS — I8500 Esophageal varices without bleeding: Secondary | ICD-10-CM | POA: Diagnosis not present

## 2024-01-30 DIAGNOSIS — I2489 Other forms of acute ischemic heart disease: Secondary | ICD-10-CM | POA: Diagnosis present

## 2024-01-30 DIAGNOSIS — Z79890 Hormone replacement therapy (postmenopausal): Secondary | ICD-10-CM

## 2024-01-30 DIAGNOSIS — D6832 Hemorrhagic disorder due to extrinsic circulating anticoagulants: Principal | ICD-10-CM | POA: Diagnosis present

## 2024-01-30 DIAGNOSIS — D693 Immune thrombocytopenic purpura: Secondary | ICD-10-CM | POA: Diagnosis not present

## 2024-01-30 DIAGNOSIS — M109 Gout, unspecified: Secondary | ICD-10-CM | POA: Diagnosis present

## 2024-01-30 DIAGNOSIS — I482 Chronic atrial fibrillation, unspecified: Secondary | ICD-10-CM | POA: Diagnosis present

## 2024-01-30 DIAGNOSIS — D61818 Other pancytopenia: Secondary | ICD-10-CM | POA: Diagnosis present

## 2024-01-30 DIAGNOSIS — K317 Polyp of stomach and duodenum: Secondary | ICD-10-CM | POA: Diagnosis present

## 2024-01-30 DIAGNOSIS — Z79899 Other long term (current) drug therapy: Secondary | ICD-10-CM

## 2024-01-30 DIAGNOSIS — Z96652 Presence of left artificial knee joint: Secondary | ICD-10-CM | POA: Diagnosis present

## 2024-01-30 DIAGNOSIS — M1712 Unilateral primary osteoarthritis, left knee: Secondary | ICD-10-CM | POA: Diagnosis present

## 2024-01-30 DIAGNOSIS — R0602 Shortness of breath: Secondary | ICD-10-CM | POA: Diagnosis not present

## 2024-01-30 DIAGNOSIS — K5730 Diverticulosis of large intestine without perforation or abscess without bleeding: Secondary | ICD-10-CM | POA: Diagnosis present

## 2024-01-30 DIAGNOSIS — K922 Gastrointestinal hemorrhage, unspecified: Secondary | ICD-10-CM

## 2024-01-30 DIAGNOSIS — Z7901 Long term (current) use of anticoagulants: Secondary | ICD-10-CM

## 2024-01-30 DIAGNOSIS — Z823 Family history of stroke: Secondary | ICD-10-CM

## 2024-01-30 DIAGNOSIS — Z87442 Personal history of urinary calculi: Secondary | ICD-10-CM

## 2024-01-30 DIAGNOSIS — K766 Portal hypertension: Secondary | ICD-10-CM | POA: Diagnosis present

## 2024-01-30 DIAGNOSIS — E039 Hypothyroidism, unspecified: Secondary | ICD-10-CM | POA: Diagnosis not present

## 2024-01-30 DIAGNOSIS — I8510 Secondary esophageal varices without bleeding: Secondary | ICD-10-CM | POA: Diagnosis present

## 2024-01-30 DIAGNOSIS — D62 Acute posthemorrhagic anemia: Secondary | ICD-10-CM | POA: Diagnosis present

## 2024-01-30 DIAGNOSIS — T45515A Adverse effect of anticoagulants, initial encounter: Secondary | ICD-10-CM | POA: Diagnosis present

## 2024-01-30 DIAGNOSIS — Z8719 Personal history of other diseases of the digestive system: Secondary | ICD-10-CM

## 2024-01-30 DIAGNOSIS — R0789 Other chest pain: Secondary | ICD-10-CM | POA: Diagnosis not present

## 2024-01-30 LAB — HEPATIC FUNCTION PANEL
ALT: 7 U/L (ref 0–44)
AST: 24 U/L (ref 15–41)
Albumin: 3.7 g/dL (ref 3.5–5.0)
Alkaline Phosphatase: 73 U/L (ref 38–126)
Bilirubin, Direct: 0.2 mg/dL (ref 0.0–0.2)
Indirect Bilirubin: 0.3 mg/dL (ref 0.3–0.9)
Total Bilirubin: 0.5 mg/dL (ref 0.0–1.2)
Total Protein: 5.6 g/dL — ABNORMAL LOW (ref 6.5–8.1)

## 2024-01-30 LAB — BASIC METABOLIC PANEL WITH GFR
Anion gap: 11 (ref 5–15)
BUN: 19 mg/dL (ref 8–23)
CO2: 21 mmol/L — ABNORMAL LOW (ref 22–32)
Calcium: 8.3 mg/dL — ABNORMAL LOW (ref 8.9–10.3)
Chloride: 108 mmol/L (ref 98–111)
Creatinine, Ser: 1.12 mg/dL (ref 0.61–1.24)
GFR, Estimated: >60 mL/min
Glucose, Bld: 132 mg/dL — ABNORMAL HIGH (ref 70–99)
Potassium: 3.9 mmol/L (ref 3.5–5.1)
Sodium: 140 mmol/L (ref 135–145)

## 2024-01-30 LAB — TROPONIN T, HIGH SENSITIVITY
Troponin T High Sensitivity: 26 ng/L — ABNORMAL HIGH (ref <19)
Troponin T High Sensitivity: 25 ng/L — ABNORMAL HIGH (ref <19)

## 2024-01-30 LAB — CBC
HCT: 19.3 % — ABNORMAL LOW (ref 39.0–52.0)
Hemoglobin: 5.4 g/dL — CL (ref 13.0–17.0)
MCH: 20.5 pg — ABNORMAL LOW (ref 26.0–34.0)
MCHC: 28 g/dL — ABNORMAL LOW (ref 30.0–36.0)
MCV: 73.4 fL — ABNORMAL LOW (ref 80.0–100.0)
Platelets: 86 K/uL — ABNORMAL LOW (ref 150–400)
RBC: 2.63 MIL/uL — ABNORMAL LOW (ref 4.22–5.81)
RDW: 17.1 % — ABNORMAL HIGH (ref 11.5–15.5)
WBC: 3.8 K/uL — ABNORMAL LOW (ref 4.0–10.5)
nRBC: 0 % (ref 0.0–0.2)

## 2024-01-30 LAB — RETICULOCYTES
Immature Retic Fract: 8.5 % (ref 2.3–15.9)
RBC.: 2.54 MIL/uL — ABNORMAL LOW (ref 4.22–5.81)
Retic Count, Absolute: 46.5 K/uL (ref 19.0–186.0)
Retic Ct Pct: 1.8 % (ref 0.4–3.1)

## 2024-01-30 LAB — OCCULT BLOOD X 1 CARD TO LAB, STOOL: Fecal Occult Bld: POSITIVE — AB

## 2024-01-30 MED ORDER — ACETAMINOPHEN 650 MG RE SUPP: 650.0000 mg | Freq: Four times a day (QID) | RECTAL | Status: DC | PRN

## 2024-01-30 MED ORDER — VITAMIN B-12 1000 MCG PO TABS
1000.0000 ug | ORAL_TABLET | Freq: Every day | ORAL | Status: DC
  Administered 2024-01-31 – 2024-02-03 (×3): 1000 ug via ORAL
  Filled 2024-01-30 (×3): qty 1

## 2024-01-30 MED ORDER — PANTOPRAZOLE SODIUM 40 MG IV SOLR
40.0000 mg | Freq: Two times a day (BID) | INTRAVENOUS | Status: DC
  Administered 2024-01-30 – 2024-02-03 (×7): 40 mg via INTRAVENOUS
  Filled 2024-01-30 (×7): qty 10

## 2024-01-30 MED ORDER — DOCUSATE SODIUM 100 MG PO CAPS
100.0000 mg | ORAL_CAPSULE | Freq: Two times a day (BID) | ORAL | Status: DC
  Administered 2024-01-30 – 2024-02-03 (×6): 100 mg via ORAL
  Filled 2024-01-30 (×7): qty 1

## 2024-01-30 MED ORDER — SODIUM CHLORIDE 0.9% FLUSH
3.0000 mL | Freq: Two times a day (BID) | INTRAVENOUS | Status: DC
  Administered 2024-01-30 – 2024-02-03 (×7): 3 mL via INTRAVENOUS

## 2024-01-30 MED ORDER — SODIUM CHLORIDE 0.9 % IV SOLN: 250.0000 mL | INTRAVENOUS | Status: AC | PRN

## 2024-01-30 MED ORDER — FERROUS SULFATE 325 (65 FE) MG PO TABS
325.0000 mg | ORAL_TABLET | Freq: Every day | ORAL | Status: DC
  Administered 2024-01-31 – 2024-02-03 (×3): 325 mg via ORAL
  Filled 2024-01-30 (×3): qty 1

## 2024-01-30 MED ORDER — LACTATED RINGERS IV SOLN: INTRAVENOUS | Status: AC

## 2024-01-30 MED ORDER — SODIUM CHLORIDE 0.9% FLUSH
3.0000 mL | Freq: Two times a day (BID) | INTRAVENOUS | Status: DC
  Administered 2024-01-30 – 2024-02-03 (×8): 3 mL via INTRAVENOUS

## 2024-01-30 MED ORDER — SODIUM CHLORIDE 0.9% IV SOLUTION: Freq: Once | INTRAVENOUS | Status: AC

## 2024-01-30 MED ORDER — PANTOPRAZOLE SODIUM 40 MG IV SOLR
40.0000 mg | Freq: Once | INTRAVENOUS | Status: AC
  Administered 2024-01-30: 40 mg via INTRAVENOUS
  Filled 2024-01-30: qty 10

## 2024-01-30 MED ORDER — ONDANSETRON HCL 4 MG/2ML IJ SOLN: 4.0000 mg | Freq: Four times a day (QID) | INTRAMUSCULAR | Status: DC | PRN

## 2024-01-30 MED ORDER — METOPROLOL TARTRATE 25 MG PO TABS: 12.5000 mg | ORAL_TABLET | Freq: Two times a day (BID) | ORAL | Status: DC

## 2024-01-30 MED ORDER — ONDANSETRON HCL 4 MG PO TABS: 4.0000 mg | ORAL_TABLET | Freq: Four times a day (QID) | ORAL | Status: DC | PRN

## 2024-01-30 MED ORDER — ROSUVASTATIN CALCIUM 5 MG PO TABS
5.0000 mg | ORAL_TABLET | Freq: Every day | ORAL | Status: DC
  Administered 2024-01-30: 5 mg via ORAL
  Filled 2024-01-30: qty 1

## 2024-01-30 MED ORDER — ACETAMINOPHEN 325 MG PO TABS: 650.0000 mg | ORAL_TABLET | Freq: Four times a day (QID) | ORAL | Status: DC | PRN

## 2024-01-30 MED ORDER — LEVOTHYROXINE SODIUM 100 MCG PO TABS
100.0000 ug | ORAL_TABLET | Freq: Every day | ORAL | Status: DC
  Administered 2024-01-31 – 2024-02-03 (×3): 100 ug via ORAL
  Filled 2024-01-30 (×3): qty 1

## 2024-01-30 MED ORDER — SODIUM CHLORIDE 0.9% FLUSH: 3.0000 mL | INTRAVENOUS | Status: DC | PRN

## 2024-01-30 NOTE — ED Provider Notes (Signed)
 [facility] Provider Note   CSN: [id number] Arrival date & time: 01/30/24  1851     Patient presents with: Chest Pain and Shortness of Breath   Joel Ford is a 73 y.o. male.   Patient is a 73 year old male with a history of atrial fibrillation on Xarelto , hyperlipidemia, hypertension, portal hypertensive gastropathy, gastric polyps, esophageal varices who is presenting today with complaints of chest pain and shortness of breath that started earlier this week.  For the last 3 days he has noticed a mild cough without sputum production or fever.  He describes a heaviness in the left side of his chest that is been fairly constant but a shortness of breath with exertion that is gradually worsened over the last 3 days.  Today he has also noticed being short of breath even at rest.  He has felt lightheaded when he is up moving around but denies any syncope.  No lower extremity swelling or abdominal pain.  He has not noticed any dark stools.  No urinary complaints.  The history is provided by the patient, the spouse and medical records.  Chest Pain Associated symptoms: shortness of breath   Shortness of Breath Associated symptoms: chest pain        Prior to Admission medications   Medication Sig Start Date End Date Taking? Authorizing Provider  allopurinol  (ZYLOPRIM ) 300 MG tablet Take 300 mg by mouth daily. 01/27/19   [provider]  cyanocobalamin  (VITAMIN B12) 1000 MCG tablet Take 1 tablet (1,000 mcg total) by mouth daily. 10/04/22   Jadine Toribio SQUIBB, MD  ferrous sulfate  325 (65 FE) MG EC tablet TAKE 1 TABLET (325 MG TOTAL) BY MOUTH DAILY WITH BREAKFAST. NEED LABS 07/19/22   Breeback, Jade L, PA-C  levothyroxine  (SYNTHROID ) 100 MCG tablet Take 1 tablet (100 mcg total) by mouth daily. 11/13/23   Breeback, Jade L, PA-C  linaclotide  (LINZESS ) 290 MCG CAPS capsule Take 1 capsule (290 mcg total) by mouth daily. 11/21/23   Breeback, Jade L,  PA-C  metoprolol  tartrate (LOPRESSOR ) 25 MG tablet TAKE 1 TABLET BY MOUTH TWICE A DAY 10/14/23   Breeback, Jade L, PA-C  omeprazole  (PRILOSEC) 40 MG capsule Take 1 capsule (40 mg total) by mouth daily. 01/14/23   Breeback, Jade L, PA-C  rosuvastatin  (CRESTOR ) 5 MG tablet Take 1 tablet (5 mg total) by mouth at bedtime. 05/14/23   Alvan Dorothyann BIRCH, MD  tiZANidine  (ZANAFLEX ) 4 MG tablet Take 0.5 tablets (2 mg total) by mouth every 8 (eight) hours as needed for muscle spasms. Patient not taking: Reported on 11/21/2023 12/13/21   Hazen Darryle BRAVO, FNP  XARELTO  20 MG TABS tablet TAKE 1 TABLET BY MOUTH DAILY WITH SUPPER. 10/14/23   Darron Deatrice LABOR, MD  zolpidem  (AMBIEN ) 5 MG tablet TAKE 1 TABLET BY MOUTH AT BEDTIME AS NEEDED FOR SLEEP. 12/03/23   Breeback, Jade L, PA-C    Allergies: Patient has no known allergies.    Review of Systems  Respiratory:  Positive for shortness of breath.   Cardiovascular:  Positive for chest pain.    Updated Vital Signs BP (!) 108/59   Pulse 86   Temp 98.3 F (36.8 C) (Oral)   Resp 19   Ht 5' 10 (1.778 m)   Wt 95.3 kg   SpO2 100%   BMI 30.13 kg/m   Physical Exam Vitals and nursing note reviewed.  Constitutional:      General: He is not in acute distress.  Appearance: He is well-developed.  HENT:     Head: Normocephalic and atraumatic.  Eyes:     Conjunctiva/sclera: Conjunctivae normal.     Pupils: Pupils are equal, round, and reactive to light.  Cardiovascular:     Rate and Rhythm: Regular rhythm. Tachycardia present.     Pulses: Normal pulses.     Heart sounds: Murmur heard.  Pulmonary:     Effort: Pulmonary effort is normal. No respiratory distress.     Breath sounds: Normal breath sounds. No wheezing or rales.  Chest:     Chest wall: No tenderness.  Abdominal:     General: There is no distension.     Palpations: Abdomen is soft.     Tenderness: There is no abdominal tenderness. There is no right CVA tenderness, left CVA tenderness, guarding or  rebound.  Genitourinary:    Comments: Stool is black on exam and heme positive. Musculoskeletal:        General: No tenderness. Normal range of motion.     Cervical back: Normal range of motion and neck supple.  Skin:    General: Skin is warm and dry.     Coloration: Skin is pale.     Findings: No erythema or rash.  Neurological:     Mental Status: He is alert and oriented to person, place, and time. Mental status is at baseline.  Psychiatric:        Behavior: Behavior normal.     (all labs ordered are listed, but only abnormal results are displayed) Labs Reviewed  BASIC METABOLIC PANEL WITH GFR - Abnormal; Notable for the following components:      Result Value   CO2 21 (*)    Glucose, Bld 132 (*)    Calcium  8.3 (*)    All other components within normal limits  CBC - Abnormal; Notable for the following components:   WBC 3.8 (*)    RBC 2.63 (*)    Hemoglobin 5.4 (*)    HCT 19.3 (*)    MCV 73.4 (*)    MCH 20.5 (*)    MCHC 28.0 (*)    RDW 17.1 (*)    Platelets 86 (*)    All other components within normal limits  OCCULT BLOOD X 1 CARD TO LAB, STOOL - Abnormal; Notable for the following components:   Fecal Occult Bld POSITIVE (*)    All other components within normal limits  TROPONIN T, HIGH SENSITIVITY - Abnormal; Notable for the following components:   Troponin T High Sensitivity 26 (*)    All other components within normal limits  TROPONIN T, HIGH SENSITIVITY    EKG: EKG Interpretation Date/Time:  Friday January 30 2024 18:57:28 EDT Ventricular Rate:  94 PR Interval:  127 QRS Duration:  92 QT Interval:  342 QTC Calculation: 428 R Axis:   34  Text Interpretation: Sinus rhythm Abnormal R-wave progression, early transition Repol abnrm suggests ischemia, anterolateral No significant change since last tracing Confirmed by Doretha Folks ([id number]) on 01/30/2024 7:11:44 PM  Radiology: [facility] Chest 2 View Result Date: 01/30/2024 CLINICAL DATA:  Chest pain EXAM: CHEST - 2  VIEW COMPARISON:  Chest x-ray 10/02/2022 FINDINGS: The heart size and mediastinal contours are within normal limits. Both lungs are clear. The visualized skeletal structures are unremarkable. IMPRESSION: No active cardiopulmonary disease. Electronically Signed   By: Greig Pique M.D.   On: 01/30/2024 20:06     Procedures   Medications Ordered in the ED  pantoprazole  (PROTONIX ) injection 40 mg (  has no administration in time range)                                    Medical Decision Making Amount and/or Complexity of Data Reviewed External Data Reviewed: notes. Labs: ordered. Decision-making details documented in ED Course. Radiology: ordered and independent interpretation performed. Decision-making details documented in ED Course. ECG/medicine tests: ordered and independent interpretation performed. Decision-making details documented in ED Course.  Risk Prescription drug management. Decision regarding hospitalization.   Pt with multiple medical problems and comorbidities and presenting today with a complaint that caries a high risk for morbidity and mortality.  Here today with complaints of chest pain and shortness of breath.  Concern for infectious etiology versus anemia versus ACS versus paroxysmal atrial fibrillation.  Lower suspicion for dissection, GERD.  I independently interpreted patient's labs and EKG.  EKG showed no acute changes from his baseline and patient is in sinus rhythm at this time.  CBC today with recurrent anemia with a hemoglobin of 5.4 with persistent thrombocytopenia with platelet count in the 80s which is not significantly different than what it has been for over 6 months.  Patient is Hemoccult positive.  BMP without acute findings with normal BUN and troponin mildly elevated at 26 thought to be related to secondary causes due to the ongoing anemia.  Because of patient's pancytopenia he also may have some type of bone marrow depression that could be also causing his  anemia however given his prior GI issues we will cover with Protonix , Dr. Karlton were notified as Dr. Rollin is his outpatient GI provider.  Patient will need a blood transfusion however the blood is not available here.  He is currently hemodynamically stable but will work on getting him transferred quickly due to his ongoing shortness of breath and chest pain.  CRITICAL CARE Performed by: Arbor Cohen Total critical care time: 40 minutes Critical care time was exclusive of separately billable procedures and treating other patients. Critical care was necessary to treat or prevent imminent or life-threatening deterioration. Critical care was time spent personally by me on the following activities: development of treatment plan with patient and/or surrogate as well as nursing, discussions with consultants, evaluation of patient's response to treatment, examination of patient, obtaining history from patient or surrogate, ordering and performing treatments and interventions, ordering and review of laboratory studies, ordering and review of radiographic studies, pulse oximetry and re-evaluation of patient's condition.       Final diagnoses:  Symptomatic anemia  Thrombocytopenia [facility])    ED Discharge Orders     None          Doretha Folks, MD 01/30/24 2037

## 2024-01-30 NOTE — ED Notes (Signed)
 Report called over to [facility] and given to [employer]

## 2024-01-30 NOTE — H&P (Signed)
 History and Physical    Joel Ford FMW:[id number] DOB: 20-Jul-1950 DOA: 01/30/2024  PCP: Antoniette Vermell CROME, PA-C   Patient coming from: Home   Chief Complaint:  Chief Complaint  Patient presents with   Chest Pain   Shortness of Breath   ED TRIAGE note:  HPI:  Joel Ford is a 73 y.o. male with medical history significant of past medical history significant for paroxysmal atrial fibrillationon Xarelto , chronic thrombocytopenia, portal hypertensive gastropathy, gastric polyp, esophageal varices, hypothyroidism, essential hypertension, chronic iron  deficiency anemia, vitamin B12 deficiency and hyperlipidemia presented emergency department complaining of shortness of breath and chest heaviness that started 2 days ago.  Patient also reporting dry cough. For the last 3 days he has noticed a mild cough without sputum production or fever. He describes a heaviness in the left side of his chest that is been fairly constant but a shortness of breath with exertion that is gradually worsened over the last 3 days. Today he has also noticed being short of breath even at rest. He has felt lightheaded when he is up moving around but denies any syncope. No lower extremity swelling or abdominal pain. Patient denies any nausea, vomiting, hematemesis, melena.   Presentation to ED patient was hemodynamically stable however further workup revealed low hemoglobin 5.1 and low platelet count 8600.  FOBT positive.  ED physician consulted Dr. Rollin and Dr. Kristie recommended IV Protonix  and will evaluate patient in the a.m.  [facility] for management of acute on chronic anemia and occult GI bleed.   ED Course:  Per chart review at presentation to ED patient is borderline hypotensive.  Otherwise hemodynamically stable. CBC showed low WBC count 3.8, low hemoglobin 5.4 (baseline hemoglobin around 7.5-13.3-variable, low MCV, low hematocrit and low platelet count 86. FOBT positive.   Pending anemia panel. Normal hepatic function panel.  CMP unremarkable. Flat and elevated troponin 25 and 26.  EKG showed normal sinus rhythm heart rate 94 abnormal progression with early transition.  Chest x-ray unremarkable.  In the ED patient received IV Protonix  40 mg.  Hospitalist has been consulted for management of acute on chronic symptomatic anemia, demand ischemia in the setting of acute anemia.   Significant labs in the ED: Lab Orders         Basic metabolic panel         CBC         Occult blood card to lab, stool         Hepatic function panel         Vitamin B12         Folate         Iron  and TIBC         Ferritin         Reticulocytes         CBC         Comprehensive metabolic panel         Hemoglobin and hematocrit, blood       Review of Systems:  Review of Systems  Constitutional:  Positive for malaise/fatigue. Negative for chills, fever and weight loss.  Respiratory:  Positive for cough and shortness of breath. Negative for hemoptysis, sputum production and wheezing.   Cardiovascular:  Positive for orthopnea. Negative for chest pain and palpitations.  Gastrointestinal:  Negative for abdominal pain, blood in stool, diarrhea, heartburn, melena, nausea and vomiting.  Genitourinary:  Negative for dysuria, frequency and urgency.  Musculoskeletal:  Negative  for back pain, falls, joint pain, myalgias and neck pain.  Neurological:  Negative for dizziness.  Psychiatric/Behavioral:  The patient is not nervous/anxious.     Past Medical History:  Diagnosis Date   ABNORMAL ELECTROCARDIOGRAM 07/04/2009   Qualifier: Diagnosis of  By: Baird FNP, [name]    Atrial fibrillation [facility])    Esophageal varices (HCC)    Gastric polyps    Gout    HYPERGLYCEMIA 07/27/2009   Qualifier: Diagnosis of  By: Bartley MD, Lamar Mulch    HYPERLIPIDEMIA 07/04/2009   Qualifier: Diagnosis of  By: Baird FNP, [name]    Left knee DJD    OA   Portal  hypertensive gastropathy (HCC)    RENAL CALCULUS 02/15/2008   Qualifier: Diagnosis of  By: Baird FNP, [name]    S/P total knee replacement 08/07/2009   right   Thyroid  disease     Past Surgical History:  Procedure Laterality Date   APPENDECTOMY     age 62   CARDIAC CATHETERIZATION  01/23/2005   Normal   COLONOSCOPY WITH PROPOFOL  N/A 10/03/2022   Procedure: COLONOSCOPY WITH PROPOFOL ;  Surgeon: Rollin Dover, MD;  Location: [facility];  Service: Gastroenterology;  Laterality: N/A;   ESOPHAGOGASTRODUODENOSCOPY (EGD) WITH PROPOFOL  N/A 10/03/2022   Procedure: ESOPHAGOGASTRODUODENOSCOPY (EGD) WITH PROPOFOL ;  Surgeon: Rollin Dover, MD;  Location: [facility];  Service: Gastroenterology;  Laterality: N/A;   FINGER SURGERY     RIGHT INDEX, REATTACHED   HEMOSTASIS CLIP PLACEMENT  10/03/2022   Procedure: HEMOSTASIS CLIP PLACEMENT;  Surgeon: Rollin Dover, MD;  Location: [facility];  Service: Gastroenterology;;   orthoscopy     Right and left knee   POLYPECTOMY  10/03/2022   Procedure: POLYPECTOMY;  Surgeon: Rollin Dover, MD;  Location: [facility];  Service: Gastroenterology;;   TOTAL KNEE ARTHROPLASTY  2011   Right   TOTAL KNEE ARTHROPLASTY Left 09/21/2012   Dr [name]   TOTAL KNEE ARTHROPLASTY Left 09/21/2012   Procedure: TOTAL KNEE ARTHROPLASTY;  Surgeon: [name], MD;  Location: [facility] OR;  Service: Orthopedics;  Laterality: Left;     reports that he has never smoked. His smokeless tobacco use includes chew. He reports that he does not drink alcohol and does not use drugs.  No Known Allergies  Family History  Problem Relation Age of Onset   Stroke Father    Healthy Mother    Healthy Brother    Healthy Son    Colon cancer Neg Hx    Prostate cancer Neg Hx     Prior to Admission medications   Medication Sig Start Date End Date Taking? Authorizing Provider  allopurinol  (ZYLOPRIM ) 300 MG tablet Take 300 mg by mouth daily. 01/27/19   [provider]   cyanocobalamin  (VITAMIN B12) 1000 MCG tablet Take 1 tablet (1,000 mcg total) by mouth daily. 10/04/22   Jadine Toribio SQUIBB, MD  ferrous sulfate  325 (65 FE) MG EC tablet TAKE 1 TABLET (325 MG TOTAL) BY MOUTH DAILY WITH BREAKFAST. NEED LABS 07/19/22   Breeback, Jade L, PA-C  levothyroxine  (SYNTHROID ) 100 MCG tablet Take 1 tablet (100 mcg total) by mouth daily. 11/13/23   Breeback, Jade L, PA-C  linaclotide  (LINZESS ) 290 MCG CAPS capsule Take 1 capsule (290 mcg total) by mouth daily. 11/21/23   Breeback, Jade L, PA-C  metoprolol  tartrate (LOPRESSOR ) 25 MG tablet TAKE 1 TABLET BY MOUTH TWICE A DAY 10/14/23   Breeback, Jade L, PA-C  omeprazole  (PRILOSEC) 40 MG capsule Take 1 capsule (40 mg  total) by mouth daily. 01/14/23   Breeback, Jade L, PA-C  rosuvastatin  (CRESTOR ) 5 MG tablet Take 1 tablet (5 mg total) by mouth at bedtime. 05/14/23   Alvan Dorothyann BIRCH, MD  tiZANidine  (ZANAFLEX ) 4 MG tablet Take 0.5 tablets (2 mg total) by mouth every 8 (eight) hours as needed for muscle spasms. Patient not taking: Reported on 11/21/2023 12/13/21   Hazen Darryle BRAVO, FNP  XARELTO  20 MG TABS tablet TAKE 1 TABLET BY MOUTH DAILY WITH SUPPER. 10/14/23   Darron Deatrice LABOR, MD  zolpidem  (AMBIEN ) 5 MG tablet TAKE 1 TABLET BY MOUTH AT BEDTIME AS NEEDED FOR SLEEP. 12/03/23   Antoniette Vermell CROME, PA-C     Physical Exam: Vitals:   01/30/24 2100 01/30/24 2115 01/30/24 2130 01/30/24 2222  BP: 102/64 99/62 108/65 113/64  Pulse: 84 85 83 91  Resp: 11 18 19 20   Temp:    100.1 F (37.8 C)  TempSrc:    Oral  SpO2: 100% 100% 99% 100%  Weight:      Height:        Physical Exam Constitutional:      General: He is not in acute distress.    Appearance: He is not ill-appearing.  Cardiovascular:     Rate and Rhythm: Normal rate and regular rhythm.     Heart sounds: Normal heart sounds.  Pulmonary:     Effort: Pulmonary effort is normal.     Breath sounds: Normal breath sounds.  Abdominal:     Palpations: Abdomen is soft. There is no  hepatomegaly or splenomegaly.  Musculoskeletal:     Right lower leg: No tenderness.     Left lower leg: No tenderness.  Skin:    Capillary Refill: Capillary refill takes less than 2 seconds.  Neurological:     Mental Status: He is alert and oriented to person, place, and time.  Psychiatric:        Mood and Affect: Mood normal. Mood is not anxious.      Labs on Admission: I have personally reviewed following labs and imaging studies  CBC: Recent Labs  Lab 01/30/24 1859  WBC 3.8*  HGB 5.4*  HCT 19.3*  MCV 73.4*  PLT 86*   Basic Metabolic Panel: Recent Labs  Lab 01/30/24 1859  NA 140  K 3.9  CL 108  CO2 21*  GLUCOSE 132*  BUN 19  CREATININE 1.12  CALCIUM  8.3*   GFR: Estimated Creatinine Clearance: 69.1 mL/min (by C-G formula based on SCr of 1.12 mg/dL). Liver Function Tests: Recent Labs  Lab 01/30/24 2046  AST 24  ALT 7  ALKPHOS 73  BILITOT 0.5  PROT 5.6*  ALBUMIN 3.7   No results for input(s): LIPASE, AMYLASE in the last 168 hours. No results for input(s): AMMONIA in the last 168 hours. Coagulation Profile: No results for input(s): INR, PROTIME in the last 168 hours. Cardiac Enzymes: No results for input(s): CKTOTAL, CKMB, CKMBINDEX, TROPONINI, TROPONINIHS in the last 168 hours. BNP (last 3 results) No results for input(s): BNP in the last 8760 hours. HbA1C: No results for input(s): HGBA1C in the last 72 hours. CBG: No results for input(s): GLUCAP in the last 168 hours. Lipid Profile: No results for input(s): CHOL, HDL, LDLCALC, TRIG, CHOLHDL, LDLDIRECT in the last 72 hours. Thyroid  Function Tests: No results for input(s): TSH, T4TOTAL, FREET4, T3FREE, THYROIDAB in the last 72 hours. Anemia Panel: Recent Labs    01/30/24 2046  RETICCTPCT 1.8   Urine analysis:    Component  Value Date/Time   COLORURINE YELLOW 12/26/2020 2029   APPEARANCEUR CLEAR 12/26/2020 2029   LABSPEC >1.030 (H) 12/26/2020  2029   PHURINE 5.5 12/26/2020 2029   GLUCOSEU NEGATIVE 12/26/2020 2029   HGBUR NEGATIVE 12/26/2020 2029   HGBUR negative 02/15/2008 1206   BILIRUBINUR negative 05/25/2021 1118   KETONESUR negative 05/25/2021 1118   KETONESUR NEGATIVE 12/26/2020 2029   PROTEINUR =30 (A) 05/25/2021 1118   PROTEINUR 30 (A) 12/26/2020 2029   UROBILINOGEN 1.0 05/25/2021 1118   UROBILINOGEN 1.0 12/15/2013 0522   NITRITE Negative 05/25/2021 1118   NITRITE NEGATIVE 12/26/2020 2029   LEUKOCYTESUR Small (1+) (A) 05/25/2021 1118   LEUKOCYTESUR NEGATIVE 12/26/2020 2029    Radiological Exams on Admission: I have personally reviewed images DG Chest 2 View Result Date: 01/30/2024 CLINICAL DATA:  Chest pain EXAM: CHEST - 2 VIEW COMPARISON:  Chest x-ray 10/02/2022 FINDINGS: The heart size and mediastinal contours are within normal limits. Both lungs are clear. The visualized skeletal structures are unremarkable. IMPRESSION: No active cardiopulmonary disease. Electronically Signed   By: Greig Pique M.D.   On: 01/30/2024 20:06      Assessment/Plan: Principal Problem:   Acute on chronic anemia Active Problems:   Elevated troponin   Paroxysmal atrial fibrillation (HCC)   Iron  deficiency anemia due to chronic blood loss   Hypothyroidism   Essential hypertension   History of portal hypertension   Chronic idiopathic thrombocytopenia (HCC)    Assessment and Plan: Acute on chronic anemia History of GI bleed Chronic microcytic anemia, iron  deficiency and vitamin B12 anemia History of portal hypertensive gastropathy and esophageal varices -Present emergency department complaining of orthopnea, dyspnea and left-sided chest pressure and heaviness with associated cough.  At presentation to ED patient found borderline hypotensive otherwise unremarkable. -Patient denies any active GI bleed. -Further workup revealed low hemoglobin 5.4 (baseline hemoglobin around 7-13 variable over the course of last 1 year.  Patient has  previous history of GI bleed in the setting of esophageal varices and portal hypertensive gastropathy.   Positive FOBT. -Normal hepatic function panel and CMP. - Chest x-ray unremarkable. -ED physician consulted GI recommended IV Protonix  and will evaluate patient in the morning. - Continue IV Protonix  40 mg twice daily - Obtaining type and screen and transfusing 3 units of blood tonight. - Starting maintenance fluid LR 125 cc/h. - Per chart review colonoscopy from 2024 showed rectal polyp. -EGD from March 2024 showed esophageal varices more than 5 mm s/p clipping, multiple gastric polyps and portal hypertensive gastropathy. -Continue to monitor development of any overt GI bleed and if patient develops hemodynamic instability in that case need to inform on-call GI immediately.   Elevated troponin-secondary to demand ischemia -Flat troponin 2526.  EKG unremarkable for any ST-T wave change.  Elevated troponin in the setting of acute on chronic anemia/demand ischemia. Continue cardiac monitoring.  Chronic thrombocytopenia -Troponin 86 which is around baseline.  Continue to monitor.  Essential hypertension Holding blood pressure regimen in setting of hypotension.  History of paroxysmal atrial fibrillation -EKG showed normal sinus rhythm heart rate 94.  Heart rate below 110.  Given patient is hypotensive holding Lopressor . The setting of low hemoglobin alcohol GI bleed holding Xarelto .  Hypothyroidism -Continue with thyroxine  DVT prophylaxis:  SCDs Code Status:  Full Code Diet: Heart healthy diet Family Communication:   Family was present at bedside, at the time of interview. Opportunity was given to ask question and all questions were answered satisfactorily.  Disposition Plan: Continue to monitor development  of any overt GI bleeding that case need to inform on-call gastroenterology immediately. Consults: Gastroenterology Admission status:   Inpatient, Telemetry bed  Severity of  Illness: The appropriate patient status for this patient is INPATIENT. Inpatient status is judged to be reasonable and necessary in order to provide the required intensity of service to ensure the patient's safety. The patient's presenting symptoms, physical exam findings, and initial radiographic and laboratory data in the context of their chronic comorbidities is felt to place them at high risk for further clinical deterioration. Furthermore, it is not anticipated that the patient will be medically stable for discharge from the hospital within 2 midnights of admission.   * I certify that at the point of admission it is my clinical judgment that the patient will require inpatient hospital care spanning beyond 2 midnights from the point of admission due to high intensity of service, high risk for further deterioration and high frequency of surveillance required.DEWAINE    Marilyne Haseley, MD [facility]  How to contact the [facility] Attending or Consulting provider 7A - 7P or covering provider during after hours 7P -7A, for this patient.  Check the care team in [facility] and look for a) attending/consulting [facility] provider listed and b) the [facility] team listed Log into [email or web address] and use [place]'s universal password to access. If you do not have the password, please contact the hospital operator. Locate the [facility] provider you are looking for under [facility] and page to a number that you can be directly reached. If you still have difficulty reaching the provider, please page the [facility] (Director on Call) for the Hospitalists listed on amion for assistance.  01/31/2024, 12:45 AM

## 2024-01-30 NOTE — ED Triage Notes (Signed)
 Pt POV- pt c/o shob, chest heaviness since yesterday afternoon.  Reports dry cough.

## 2024-01-30 NOTE — Progress Notes (Signed)
 Plan of Care Note for accepted transfer   Patient: Joel Ford MRN: [id number]   DOA: 01/30/2024  Facility requesting transfer: [facility]   Requesting Provider: Dr. Doretha   Reason for transfer: Symptomatic anemia   Facility course: 73 yr old man with hx of hypothyroidism, IDA, B12 deficiency, and PAF on Xarelto  presents with SOB and chest heaviness.   He is hemodynamically stable. Labs are most notable for WBC 3.8, Hgb 5.1, and platelets 86,000. FOBT is positive.   ED physician has sent a secure chat to Dr. Rollin and Dr. Kristie of GI, given IV Protonix , and is ordering LFTs, differential, and anemia panel.   Plan of care: The patient is accepted for admission to Telemetry unit, at [facility].   Author: Evalene GORMAN Sprinkles, MD 01/30/2024  Check [email or web address] for on-call coverage.  Nursing staff, Please call [facility] Admits & Consults System-Wide number on Amion as soon as patient's arrival, so appropriate admitting provider can evaluate the pt.

## 2024-01-31 DIAGNOSIS — D696 Thrombocytopenia, unspecified: Secondary | ICD-10-CM | POA: Diagnosis present

## 2024-01-31 DIAGNOSIS — I8501 Esophageal varices with bleeding: Secondary | ICD-10-CM | POA: Diagnosis not present

## 2024-01-31 DIAGNOSIS — K3189 Other diseases of stomach and duodenum: Secondary | ICD-10-CM | POA: Diagnosis not present

## 2024-01-31 DIAGNOSIS — I4891 Unspecified atrial fibrillation: Secondary | ICD-10-CM | POA: Diagnosis not present

## 2024-01-31 DIAGNOSIS — Z7901 Long term (current) use of anticoagulants: Secondary | ICD-10-CM | POA: Diagnosis not present

## 2024-01-31 DIAGNOSIS — Z96652 Presence of left artificial knee joint: Secondary | ICD-10-CM | POA: Diagnosis not present

## 2024-01-31 DIAGNOSIS — E785 Hyperlipidemia, unspecified: Secondary | ICD-10-CM | POA: Diagnosis not present

## 2024-01-31 DIAGNOSIS — I8500 Esophageal varices without bleeding: Secondary | ICD-10-CM | POA: Diagnosis not present

## 2024-01-31 DIAGNOSIS — D62 Acute posthemorrhagic anemia: Secondary | ICD-10-CM | POA: Diagnosis not present

## 2024-01-31 DIAGNOSIS — D6832 Hemorrhagic disorder due to extrinsic circulating anticoagulants: Secondary | ICD-10-CM | POA: Diagnosis not present

## 2024-01-31 DIAGNOSIS — Z87442 Personal history of urinary calculi: Secondary | ICD-10-CM | POA: Diagnosis not present

## 2024-01-31 DIAGNOSIS — T45515A Adverse effect of anticoagulants, initial encounter: Secondary | ICD-10-CM | POA: Diagnosis not present

## 2024-01-31 DIAGNOSIS — Z79899 Other long term (current) drug therapy: Secondary | ICD-10-CM | POA: Diagnosis not present

## 2024-01-31 DIAGNOSIS — I2489 Other forms of acute ischemic heart disease: Secondary | ICD-10-CM | POA: Diagnosis not present

## 2024-01-31 DIAGNOSIS — K766 Portal hypertension: Secondary | ICD-10-CM | POA: Diagnosis not present

## 2024-01-31 DIAGNOSIS — I482 Chronic atrial fibrillation, unspecified: Secondary | ICD-10-CM | POA: Diagnosis not present

## 2024-01-31 DIAGNOSIS — D649 Anemia, unspecified: Secondary | ICD-10-CM | POA: Diagnosis not present

## 2024-01-31 DIAGNOSIS — E538 Deficiency of other specified B group vitamins: Secondary | ICD-10-CM | POA: Diagnosis not present

## 2024-01-31 DIAGNOSIS — I480 Paroxysmal atrial fibrillation: Secondary | ICD-10-CM | POA: Diagnosis not present

## 2024-01-31 DIAGNOSIS — I8510 Secondary esophageal varices without bleeding: Secondary | ICD-10-CM | POA: Diagnosis not present

## 2024-01-31 DIAGNOSIS — M1712 Unilateral primary osteoarthritis, left knee: Secondary | ICD-10-CM | POA: Diagnosis not present

## 2024-01-31 DIAGNOSIS — K5730 Diverticulosis of large intestine without perforation or abscess without bleeding: Secondary | ICD-10-CM | POA: Diagnosis not present

## 2024-01-31 DIAGNOSIS — Z79890 Hormone replacement therapy (postmenopausal): Secondary | ICD-10-CM | POA: Diagnosis not present

## 2024-01-31 DIAGNOSIS — K317 Polyp of stomach and duodenum: Secondary | ICD-10-CM | POA: Diagnosis not present

## 2024-01-31 DIAGNOSIS — I10 Essential (primary) hypertension: Secondary | ICD-10-CM | POA: Diagnosis not present

## 2024-01-31 DIAGNOSIS — I352 Nonrheumatic aortic (valve) stenosis with insufficiency: Secondary | ICD-10-CM | POA: Diagnosis not present

## 2024-01-31 DIAGNOSIS — D61818 Other pancytopenia: Secondary | ICD-10-CM | POA: Diagnosis not present

## 2024-01-31 DIAGNOSIS — F17220 Nicotine dependence, chewing tobacco, uncomplicated: Secondary | ICD-10-CM | POA: Diagnosis not present

## 2024-01-31 DIAGNOSIS — K922 Gastrointestinal hemorrhage, unspecified: Secondary | ICD-10-CM | POA: Diagnosis not present

## 2024-01-31 DIAGNOSIS — Z823 Family history of stroke: Secondary | ICD-10-CM | POA: Diagnosis not present

## 2024-01-31 DIAGNOSIS — D693 Immune thrombocytopenic purpura: Secondary | ICD-10-CM | POA: Diagnosis not present

## 2024-01-31 DIAGNOSIS — E039 Hypothyroidism, unspecified: Secondary | ICD-10-CM | POA: Diagnosis not present

## 2024-01-31 LAB — CBC
HCT: 24.4 % — ABNORMAL LOW (ref 39.0–52.0)
Hemoglobin: 7.1 g/dL — ABNORMAL LOW (ref 13.0–17.0)
MCH: 23.1 pg — ABNORMAL LOW (ref 26.0–34.0)
MCHC: 29.1 g/dL — ABNORMAL LOW (ref 30.0–36.0)
MCV: 79.5 fL — ABNORMAL LOW (ref 80.0–100.0)
Platelets: 65 K/uL — ABNORMAL LOW (ref 150–400)
RBC: 3.07 MIL/uL — ABNORMAL LOW (ref 4.22–5.81)
RDW: 17.1 % — ABNORMAL HIGH (ref 11.5–15.5)
WBC: 2.8 K/uL — ABNORMAL LOW (ref 4.0–10.5)
nRBC: 0 % (ref 0.0–0.2)

## 2024-01-31 LAB — COMPREHENSIVE METABOLIC PANEL WITH GFR
ALT: 10 U/L (ref 0–44)
AST: 13 U/L — ABNORMAL LOW (ref 15–41)
Albumin: 3.1 g/dL — ABNORMAL LOW (ref 3.5–5.0)
Alkaline Phosphatase: 63 U/L (ref 38–126)
Anion gap: 9 (ref 5–15)
BUN: 16 mg/dL (ref 8–23)
CO2: 20 mmol/L — ABNORMAL LOW (ref 22–32)
Calcium: 8.1 mg/dL — ABNORMAL LOW (ref 8.9–10.3)
Chloride: 108 mmol/L (ref 98–111)
Creatinine, Ser: 0.9 mg/dL (ref 0.61–1.24)
GFR, Estimated: >60 mL/min (ref >60)
Glucose, Bld: 99 mg/dL (ref 70–99)
Potassium: 3.8 mmol/L (ref 3.5–5.1)
Sodium: 137 mmol/L (ref 135–145)
Total Bilirubin: 2.1 mg/dL — ABNORMAL HIGH (ref 0.0–1.2)
Total Protein: 5.5 g/dL — ABNORMAL LOW (ref 6.5–8.1)

## 2024-01-31 LAB — IRON AND TIBC
Iron: 6 ug/dL — ABNORMAL LOW (ref 45–182)
Saturation Ratios: 1 % — ABNORMAL LOW (ref 17.9–39.5)
TIBC: 434 ug/dL (ref 250–450)
UIBC: 428 ug/dL

## 2024-01-31 LAB — VITAMIN B12: Vitamin B-12: 343 pg/mL (ref 180–914)

## 2024-01-31 LAB — FOLATE: Folate: 9 ng/mL (ref >5.9)

## 2024-01-31 LAB — PREPARE RBC (CROSSMATCH)

## 2024-01-31 LAB — FERRITIN: Ferritin: 4 ng/mL — ABNORMAL LOW (ref 24–336)

## 2024-01-31 MED ORDER — ROSUVASTATIN CALCIUM 5 MG PO TABS: 5.0000 mg | ORAL_TABLET | ORAL | Status: DC

## 2024-01-31 NOTE — Progress Notes (Signed)
 Progress Note   Patient: Joel Ford FMW:[id number] DOB: 1950-09-07 DOA: 01/30/2024     0 DOS: the patient was seen and examined on 01/31/2024   Brief hospital course: 73yo with h/o afib on Xarelto , chronic thrombocytopenia, portal HTN with esophageal varices, HTN, and HLD who presented on 7/25 with SOB and CP.  Hgb 5.1, heme positive, GI consulted.  He was started on IV Protonix  and transfused 3 units PRBC.  Assessment and Plan:  Acute on chronic anemia with probable UGI bleeding Patient with chronic microcytic anemia, iron  deficiency and vitamin B12 anemia with GI bleed in 09/2022 related to portal hypertensive gastropathy and esophageal varices EGD on 10/03/22 with large varices s/p clipping and multiple gastric polyps and stigmata of recent bleeding that were removed (also with a 3 mm polyp in the rectum and sigmoid diverticulosis on colonoscopy); pathology of both stomach and rectal polyps was c/w hyperplastic polyps Presented with orthopnea, dyspnea and left-sided chest pressure and heaviness with associated cough Workup revealed low hemoglobin 5.4 (baseline hemoglobin around 7-13 variable over the course of last 1 year) Positive FOBT IV Protonix  BID GI consulted Transfused 3 units PRBC He is on Xarelto  and so will need to wait on EGD; last dose appears to have been 7/24 and so will make NPO after MN for EGD on 7/27   Elevated troponin Flat troponin 26, 25 EKG unremarkable for any ST-T wave change Elevated troponin in the setting of acute on chronic anemia/demand ischemia without MI   Chronic thrombocytopenia Appears to be stable   Essential hypertension Holding blood pressure regimen in setting of hypotension  HLD Continue rosuvastatin  3x weekly   History of paroxysmal atrial fibrillation EKG showed normal sinus rhythm heart rate 94 Rate controlled but holding metoprolol  in the setting of marginal BPs Hold Xarelto    Hypothyroidism Continue Synthroid        Consultants: GI  Procedures: EGD 7/27  Antibiotics: None      Subjective: Feels good today.  Definitely better after blood.  No abdominal pain, has not noticed tarry stools.  Had a similar admission in 09/2022.   Objective: Vitals:   01/31/24 1030 01/31/24 1250  BP: 105/73 99/64  Pulse: 77 74  Resp: 18 16  Temp: (!) 97.4 F (36.3 C) 98.1 F (36.7 C)  SpO2: 100% 94%    Intake/Output Summary (Last 24 hours) at 01/31/2024 1404 Last data filed at 01/31/2024 1030 Gross per 24 hour  Intake 1139.5 ml  Output --  Net 1139.5 ml   Filed Weights   01/30/24 1857 01/31/24 0400  Weight: 95.3 kg 89.5 kg    Exam:  General:  Appears calm and comfortable and is in NAD Eyes:   normal lids, iris ENT:  grossly normal hearing, lips & tongue, mmm Cardiovascular:  RRR. No LE edema.  Respiratory:   CTA bilaterally with no wheezes/rales/rhonchi.  Normal respiratory effort. Abdomen:  soft, NT, ND Skin:  no rash or induration seen on limited exam Musculoskeletal:  grossly normal tone BUE/BLE, good ROM, no bony abnormality Psychiatric:  grossly normal mood and affect, speech fluent and appropriate, AOx3 Neurologic:  CN 2-12 grossly intact, moves all extremities in coordinated fashion, sensation intact  Data Reviewed: I have reviewed the patient's lab results since admission.  Pertinent labs for today include:   GLucose 132 HS troponin 26, 25 WBC 3.8 Hgb 5.4 Platelets 86     Family Communication: Wife and son were present throughout evaluation  Disposition: Status is: Observation The patient remains OBS  appropriate and will d/c before 2 midnights.     Time spent: 50 minutes  Unresulted Labs (From admission, onward)     Start     Ordered   01/31/24 1358  CBC  Once,   R       Question:  Specimen collection method  Answer:  Lab=Lab collect   01/31/24 1357   01/31/24 0500  CBC  Tomorrow morning,   R        01/30/24 2301   01/31/24 0500  Comprehensive metabolic  panel  Tomorrow morning,   R        01/30/24 2301             Author: Delon Herald, MD 01/31/2024 2:04 PM  For on call review [email or web address].

## 2024-01-31 NOTE — Care Management Obs Status (Signed)
 MEDICARE OBSERVATION STATUS NOTIFICATION   Patient Details  Name: Joel Ford MRN: [id number] Date of Birth: [birth year]   Medicare Observation Status Notification Given:  Yes    Sheri ONEIDA Sharps, LCSW 01/31/2024, 4:35 PM

## 2024-01-31 NOTE — Consult Note (Signed)
 Reason for Consult: Severe anemia with a hemoglobin of 5.4 g/dL Referring Physician: [facility] hospitalist.  Joel Ford is an 73 y.o. male.  HPI: Joel Ford is a 73 year old white male with multiple medical problems listed below, who presented to the emergency room with a 3-day history of worsening shortness of breath and chest heaviness and was noted to have a hemoglobin of 5.4 g/dL in the ER.  He denies having any hematemesis, coffee-ground emesis or rectal bleeding.  There is no history of nausea vomiting or abdominal pain.  He has been extremely short of breath on exertion but denies any near-syncope or syncopal events. He has had a dry cough without any expectoration. He is on Xarelto  for atrial fibrillation. He had a EGD and a colonoscopy done last day by Dr. Belvie Just. He was noted to have diverticulosis and rectal polyp was removed. On upper endoscopy a bleeding polyp was noted that was removed and a Hemoclip was applied.  Large varices were also noted but there was no evidence of bleeding from the varices. Portal hypertensive gastropathy was also described. He was noted to be guaiac positive on exam in the ED. He is on iron  supplement for chronic iron  deficiency anemia which she takes on a regular basis. He received 2 units of packed red blood cells in the ED. His last dose of Xarelto  was on 01/29/24.  Past Medical History:  Diagnosis Date   ABNORMAL ELECTROCARDIOGRAM 07/04/2009   Qualifier: Diagnosis of  By: Baird FNP, [name]    Atrial fibrillation [facility])    Esophageal varices (HCC)    Gastric polyps    Gout    HYPERGLYCEMIA 07/27/2009   Qualifier: Diagnosis of  By: Bartley MD, Lamar Mulch    HYPERLIPIDEMIA 07/04/2009   Qualifier: Diagnosis of  By: Baird FNP, [name]    Left knee DJD    OA   Portal hypertensive gastropathy (HCC)    RENAL CALCULUS 02/15/2008   Qualifier: Diagnosis of  By: Baird FNP, [name]    S/P total knee replacement 08/07/2009    right   Thyroid  disease    Past Surgical History:  Procedure Laterality Date   APPENDECTOMY     age 21   CARDIAC CATHETERIZATION  01/23/2005   Normal   COLONOSCOPY WITH PROPOFOL  N/A 10/03/2022   Procedure: COLONOSCOPY WITH PROPOFOL ;  Surgeon: Just Belvie, MD;  Location: [facility];  Service: Gastroenterology;  Laterality: N/A;   ESOPHAGOGASTRODUODENOSCOPY (EGD) WITH PROPOFOL  N/A 10/03/2022   Procedure: ESOPHAGOGASTRODUODENOSCOPY (EGD) WITH PROPOFOL ;  Surgeon: Just Belvie, MD;  Location: [facility];  Service: Gastroenterology;  Laterality: N/A;   FINGER SURGERY     RIGHT INDEX, REATTACHED   HEMOSTASIS CLIP PLACEMENT  10/03/2022   Procedure: HEMOSTASIS CLIP PLACEMENT;  Surgeon: Just Belvie, MD;  Location: [facility];  Service: Gastroenterology;;   orthoscopy     Right and left knee   POLYPECTOMY  10/03/2022   Procedure: POLYPECTOMY;  Surgeon: Just Belvie, MD;  Location: [facility];  Service: Gastroenterology;;   TOTAL KNEE ARTHROPLASTY  2011   Right   TOTAL KNEE ARTHROPLASTY Left 09/21/2012   Dr [name]   TOTAL KNEE ARTHROPLASTY Left 09/21/2012   Procedure: TOTAL KNEE ARTHROPLASTY;  Surgeon: [name], MD;  Location: [facility] OR;  Service: Orthopedics;  Laterality: Left;   Family History  Problem Relation Age of Onset   Stroke Father    Healthy Mother    Healthy Brother    Healthy Son    Colon cancer  Neg Hx    Prostate cancer Neg Hx    Social History:  reports that he has never smoked. His smokeless tobacco use includes chew. He reports that he does not drink alcohol and does not use drugs.  Allergies: No Known Allergies  Medications: I have reviewed the patient's current medications. Prior to Admission:  Medications Prior to Admission  Medication Sig Dispense Refill Last Dose/Taking   allopurinol  (ZYLOPRIM ) 300 MG tablet Take 300 mg by mouth daily.   01/30/2024   cyanocobalamin  (VITAMIN B12) 1000 MCG tablet Take 1 tablet (1,000 mcg total) by mouth daily.    01/30/2024   ferrous sulfate  325 (65 FE) MG EC tablet TAKE 1 TABLET (325 MG TOTAL) BY MOUTH DAILY WITH BREAKFAST. NEED LABS 90 tablet 1 01/30/2024   levothyroxine  (SYNTHROID ) 100 MCG tablet Take 1 tablet (100 mcg total) by mouth daily. 90 tablet 1 01/30/2024   linaclotide  (LINZESS ) 290 MCG CAPS capsule Take 1 capsule (290 mcg total) by mouth daily. 90 capsule 3 01/30/2024   metoprolol  tartrate (LOPRESSOR ) 25 MG tablet TAKE 1 TABLET BY MOUTH TWICE A DAY 180 tablet 0 01/30/2024   rosuvastatin  (CRESTOR ) 5 MG tablet Take 1 tablet (5 mg total) by mouth at bedtime. 100 tablet 3 01/29/2024   XARELTO  20 MG TABS tablet TAKE 1 TABLET BY MOUTH DAILY WITH SUPPER. 90 tablet 1 01/29/2024   zolpidem  (AMBIEN ) 5 MG tablet TAKE 1 TABLET BY MOUTH AT BEDTIME AS NEEDED FOR SLEEP. 90 tablet 1 01/29/2024   omeprazole  (PRILOSEC) 40 MG capsule Take 1 capsule (40 mg total) by mouth daily. (Patient not taking: Reported on 01/31/2024) 90 capsule 3 Not Taking   tiZANidine  (ZANAFLEX ) 4 MG tablet Take 0.5 tablets (2 mg total) by mouth every 8 (eight) hours as needed for muscle spasms. (Patient not taking: Reported on 11/21/2023) 20 tablet 0 Not Taking   Scheduled:  cyanocobalamin   1,000 mcg Oral Daily   docusate sodium   100 mg Oral BID   ferrous sulfate   325 mg Oral Q breakfast   levothyroxine   100 mcg Oral Q0600   pantoprazole  (PROTONIX ) IV  40 mg Intravenous Q12H   rosuvastatin   5 mg Oral QHS   sodium chloride  flush  3 mL Intravenous Q12H   sodium chloride  flush  3 mL Intravenous Q12H   Continuous:  sodium chloride      lactated ringers  125 mL/hr at 01/30/24 2302   PRN:sodium chloride , acetaminophen  **OR** acetaminophen , ondansetron  **OR** ondansetron  (ZOFRAN ) IV, sodium chloride  flush  Results for orders placed or performed during the hospital encounter of 01/30/24 (from the past 48 hours)  Basic metabolic panel     Status: Abnormal   Collection Time: 01/30/24  6:59 PM  Result Value Ref Range   Sodium 140 135 - 145 mmol/L    Potassium 3.9 3.5 - 5.1 mmol/L   Chloride 108 98 - 111 mmol/L   CO2 21 (L) 22 - 32 mmol/L   Glucose, Bld 132 (H) 70 - 99 mg/dL    Comment: Glucose reference range applies only to samples taken after fasting for at least 8 hours.   BUN 19 8 - 23 mg/dL   Creatinine, Ser 8.87 0.61 - 1.24 mg/dL   Calcium  8.3 (L) 8.9 - 10.3 mg/dL   GFR, Estimated >39 >39 mL/min    Comment: (NOTE) Calculated using the CKD-EPI Creatinine Equation (2021)    Anion gap 11 5 - 15    Comment: Performed at [facility], [address] [place], [place]  [address]  CBC     Status: Abnormal   Collection Time: 01/30/24  6:59 PM  Result Value Ref Range   WBC 3.8 (L) 4.0 - 10.5 K/uL   RBC 2.63 (L) 4.22 - 5.81 MIL/uL   Hemoglobin 5.4 (LL) 13.0 - 17.0 g/dL    Comment: REPEATED TO VERIFY Reticulocyte Hemoglobin testing may be clinically indicated, consider ordering this additional test [id number] THIS CRITICAL RESULT HAS VERIFIED AND BEEN CALLED TO BAILEY OSORIO RN BY CAROL PHILLIPS ON 07 25 2025 AT 1932, AND HAS BEEN READ BACK.     HCT 19.3 (L) 39.0 - 52.0 %   MCV 73.4 (L) 80.0 - 100.0 fL   MCH 20.5 (L) 26.0 - 34.0 pg   MCHC 28.0 (L) 30.0 - 36.0 g/dL   RDW 82.8 (H) 88.4 - 84.4 %   Platelets 86 (L) 150 - 400 K/uL    Comment: SPECIMEN CHECKED FOR CLOTS Immature Platelet Fraction may be clinically indicated, consider ordering this additional test [id number] REPEATED TO VERIFY    nRBC 0.0 0.0 - 0.2 %    Comment: Performed at [facility], [address] [place], [place] [address]  Troponin T, High Sensitivity     Status: Abnormal   Collection Time: 01/30/24  6:59 PM  Result Value Ref Range   Troponin T High Sensitivity 26 (H) <19 ng/L    Comment: (NOTE) Biotin concentrations > 1000 ng/mL falsely decrease TnT results.  Serial cardiac troponin measurements are suggested.  Refer to the Links section for chest pain algorithms and additional  guidance. Performed at [facility],  [address] [place], [place] [address]   Occult blood card to lab, stool     Status: Abnormal   Collection Time: 01/30/24  8:18 PM  Result Value Ref Range   Fecal Occult Bld POSITIVE (A) NEGATIVE    Comment: Performed at [facility], [address] [place], [place] [address]  Troponin T, High Sensitivity     Status: Abnormal   Collection Time: 01/30/24  8:46 PM  Result Value Ref Range   Troponin T High Sensitivity 25 (H) <19 ng/L    Comment: (NOTE) Biotin concentrations > 1000 ng/mL falsely decrease TnT results.  Serial cardiac troponin measurements are suggested.  Refer to the Links section for chest pain algorithms and additional  guidance. Performed at [facility], [address] [place], [place] [address]   Hepatic function panel     Status: Abnormal   Collection Time: 01/30/24  8:46 PM  Result Value Ref Range   Total Protein 5.6 (L) 6.5 - 8.1 g/dL   Albumin 3.7 3.5 - 5.0 g/dL   AST 24 15 - 41 U/L   ALT 7 0 - 44 U/L   Alkaline Phosphatase 73 38 - 126 U/L   Total Bilirubin 0.5 0.0 - 1.2 mg/dL   Bilirubin, Direct 0.2 0.0 - 0.2 mg/dL   Indirect Bilirubin 0.3 0.3 - 0.9 mg/dL    Comment: Performed at [facility], [address] [place], [place] [address]  Reticulocytes     Status: Abnormal   Collection Time: 01/30/24  8:46 PM  Result Value Ref Range   Retic Ct Pct 1.8 0.4 - 3.1 %   RBC. 2.54 (L) 4.22 - 5.81 MIL/uL   Retic Count, Absolute 46.5 19.0 - 186.0 K/uL   Immature Retic Fract 8.5 2.3 - 15.9 %    Comment: Performed at  [facility], [address] [place], [place] [address]  Type and screen [facility]     Status: None (Preliminary result)   Collection Time: 01/30/24 10:53 PM  Result Value Ref Range   ABO/RH(D) AB POS    Antibody Screen NEG    Sample Expiration 02/02/2024,2359    Unit Number [id number]    Blood Component Type RED CELLS,LR    Unit division 00    Status of Unit ISSUED     Transfusion Status OK TO TRANSFUSE    Crossmatch Result Compatible    Unit Number [id number]    Blood Component Type RED CELLS,LR    Unit division 00    Status of Unit ISSUED    Transfusion Status OK TO TRANSFUSE    Crossmatch Result      Compatible Performed at [facility], [address], [place], [place] [address]    Unit Number [id number]    Blood Component Type RED CELLS,LR    Unit division 00    Status of Unit ISSUED    Transfusion Status OK TO TRANSFUSE    Crossmatch Result Compatible   Prepare RBC (crossmatch)     Status: None   Collection Time: 01/30/24 10:57 PM  Result Value Ref Range   Order Confirmation      ORDER PROCESSED BY BLOOD BANK Performed at [facility], [address] [place], [place] [address]     DG Chest 2 View Result Date: 01/30/2024 CLINICAL DATA:  Chest pain EXAM: CHEST - 2 VIEW COMPARISON:  Chest x-ray 10/02/2022 FINDINGS: The heart size and mediastinal contours are within normal limits. Both lungs are clear. The visualized skeletal structures are unremarkable. IMPRESSION: No active cardiopulmonary disease. Electronically Signed   By: Greig Pique M.D.   On: 01/30/2024 20:06   Review of Systems  Constitutional:  Positive for activity change and fatigue. Negative for appetite change, chills, diaphoresis, fever and unexpected weight change.  HENT: Negative.    Respiratory:  Positive for cough and shortness of breath. Negative for apnea, choking, chest tightness, wheezing and stridor.   Cardiovascular:  Positive for chest pain and palpitations. Negative for leg swelling.  Gastrointestinal: Negative.   Endocrine: Negative.   Genitourinary: Negative.   Musculoskeletal:  Positive for arthralgias.  Allergic/Immunologic: Negative.   Neurological:  Positive for weakness and light-headedness. Negative for dizziness, tremors, seizures, syncope, facial asymmetry, speech difficulty and numbness.  Hematological:  Negative.   Psychiatric/Behavioral: Negative.     Blood pressure 103/60, pulse 75, temperature 98.2 F (36.8 C), temperature source Oral, resp. rate 17, height 5' 10 (1.778 m), weight 89.5 kg, SpO2 99%. Physical Exam Constitutional:      General: He is not in acute distress.    Appearance: He is well-developed. He is not ill-appearing or toxic-appearing.  HENT:     Head: Normocephalic and atraumatic.  Eyes:     Extraocular Movements: Extraocular movements intact.     Pupils: Pupils are equal, round, and reactive to light.  Cardiovascular:     Rate and Rhythm: Regular rhythm.     Heart sounds: Normal heart sounds.  Pulmonary:     Effort: Pulmonary effort is normal.     Breath sounds: Normal breath sounds.  Musculoskeletal:        General: Normal range of motion.     Cervical back: Normal range of motion and neck supple.  Skin:    General: Skin is warm and dry.  Neurological:  General: No focal deficit present.     Mental Status: He is alert and oriented to person, place, and time.  Psychiatric:        Mood and Affect: Mood normal.        Behavior: Behavior normal.   Assessment/Plan: 1) Acute on chronic iron  deficiency anemia with guaiac positive stools-history of bleeding gastric polyps in the past patient may need a repeat EGD once the result was washed out. 2) Severe thrombocytopenia with vitamin B12 deficiency and portal hypertensive gastropathy with esophageal varices. 3) Chest pain/Elevated troponin is probably secondary to demand ischemia. 4) HTN. 5) Chronic atrial fibrillation on Xarelto  which has been held for now. 6) Hypothyroidism.  7) Colonic diverticulosis.  [name] 01/31/2024, 8:45 AM

## 2024-01-31 NOTE — Hospital Course (Signed)
 72yo with h/o afib on Xarelto , chronic thrombocytopenia, portal HTN with esophageal varices, HTN, and HLD who presented on 7/25 with SOB and CP.  Hgb 5.1, heme positive, GI consulted.  He was started on IV Protonix  and transfused 3 units PRBC.

## 2024-02-01 DIAGNOSIS — D649 Anemia, unspecified: Secondary | ICD-10-CM | POA: Diagnosis not present

## 2024-02-01 LAB — CBC WITH DIFFERENTIAL/PLATELET
Abs Immature Granulocytes: 0.01 K/uL (ref 0.00–0.07)
Basophils Absolute: 0 K/uL (ref 0.0–0.1)
Basophils Relative: 0 %
Eosinophils Absolute: 0.1 K/uL (ref 0.0–0.5)
Eosinophils Relative: 3 %
HCT: 26.3 % — ABNORMAL LOW (ref 39.0–52.0)
Hemoglobin: 7.4 g/dL — ABNORMAL LOW (ref 13.0–17.0)
Immature Granulocytes: 0 %
Lymphocytes Relative: 12 %
Lymphs Abs: 0.3 K/uL — ABNORMAL LOW (ref 0.7–4.0)
MCH: 22.4 pg — ABNORMAL LOW (ref 26.0–34.0)
MCHC: 28.1 g/dL — ABNORMAL LOW (ref 30.0–36.0)
MCV: 79.7 fL — ABNORMAL LOW (ref 80.0–100.0)
Monocytes Absolute: 0.3 K/uL (ref 0.1–1.0)
Monocytes Relative: 12 %
Neutro Abs: 1.9 K/uL (ref 1.7–7.7)
Neutrophils Relative %: 73 %
Platelets: 64 K/uL — ABNORMAL LOW (ref 150–400)
RBC: 3.3 MIL/uL — ABNORMAL LOW (ref 4.22–5.81)
RDW: 17.4 % — ABNORMAL HIGH (ref 11.5–15.5)
WBC: 2.5 K/uL — ABNORMAL LOW (ref 4.0–10.5)
nRBC: 0 % (ref 0.0–0.2)

## 2024-02-01 LAB — SURGICAL PCR SCREEN
MRSA, PCR: NEGATIVE
Staphylococcus aureus: NEGATIVE

## 2024-02-01 MED ORDER — LACTATED RINGERS IV SOLN: INTRAVENOUS | Status: AC

## 2024-02-01 MED ORDER — IRON SUCROSE 200 MG IVPB - SIMPLE MED
200.0000 mg | Freq: Once | Status: AC
  Administered 2024-02-01: 200 mg via INTRAVENOUS
  Filled 2024-02-01: qty 200

## 2024-02-01 MED ORDER — MUPIROCIN 2 % EX OINT
1.0000 | TOPICAL_OINTMENT | Freq: Two times a day (BID) | CUTANEOUS | Status: DC
  Administered 2024-02-01 – 2024-02-03 (×4): 1 via NASAL
  Filled 2024-02-01: qty 22

## 2024-02-01 MED ORDER — SODIUM CHLORIDE 0.9 % IV SOLN: INTRAVENOUS | Status: DC

## 2024-02-01 NOTE — H&P (View-Only) (Signed)
 Subjective: Patient seems to be doing well today.  He denies having abdominal pain nausea or vomiting. There is no history of melena hematochezia. His hemoglobin is 7.1 g/dL.  He is laying comfortably in bed. He is questioning as to when he could go home.  Objective: Vital signs in last 24 hours: Temp:  [97.4 F (36.3 C)-98.1 F (36.7 C)] 97.8 F (36.6 C) (07/27 0528) Pulse Rate:  [74-83] 83 (07/27 0528) Resp:  [16-18] 18 (07/27 0528) BP: (99-126)/(64-79) 126/79 (07/27 0528) SpO2:  [94 %-100 %] 98 % (07/27 0528) Last BM Date : 01/30/24  Intake/Output from previous day: 07/26 0701 - 07/27 0700 In: 1439.5 [I.V.:707.5; Blood:732] Out: -  Intake/Output this shift: No intake/output data recorded.  General appearance: alert, cooperative, appears stated age, and no distress Resp: clear to auscultation bilaterally Cardio: regular rate and rhythm, S1, S2 normal, no murmur, click, rub or gallop GI: soft, non-tender; bowel sounds normal; no masses,  no organomegaly  Lab Results: Recent Labs    01/30/24 1859 01/31/24 1600  WBC 3.8* 2.8*  HGB 5.4* 7.1*  HCT 19.3* 24.4*  PLT 86* 65*   BMET Recent Labs    01/30/24 1859 01/31/24 1600  NA 140 137  K 3.9 3.8  CL 108 108  CO2 21* 20*  GLUCOSE 132* 99  BUN 19 16  CREATININE 1.12 0.90  CALCIUM  8.3* 8.1*   LFT Recent Labs    01/30/24 2046 01/31/24 1600  PROT 5.6* 5.5*  ALBUMIN 3.7 3.1*  AST 24 13*  ALT 7 10  ALKPHOS 73 63  BILITOT 0.5 2.1*  BILIDIR 0.2  --   IBILI 0.3  --    Studies/Results: DG Chest 2 View Result Date: 01/30/2024 CLINICAL DATA:  Chest pain EXAM: CHEST - 2 VIEW COMPARISON:  Chest x-ray 10/02/2022 FINDINGS: The heart size and mediastinal contours are within normal limits. Both lungs are clear. The visualized skeletal structures are unremarkable. IMPRESSION: No active cardiopulmonary disease. Electronically Signed   By: Greig Pique M.D.   On: 01/30/2024 20:06   Medications: I have reviewed the patient's  current medications. Prior to Admission:  Medications Prior to Admission  Medication Sig Dispense Refill Last Dose/Taking   allopurinol  (ZYLOPRIM ) 300 MG tablet Take 300 mg by mouth daily.   01/30/2024   cyanocobalamin  (VITAMIN B12) 1000 MCG tablet Take 1 tablet (1,000 mcg total) by mouth daily.   01/30/2024   ferrous sulfate  325 (65 FE) MG EC tablet TAKE 1 TABLET (325 MG TOTAL) BY MOUTH DAILY WITH BREAKFAST. NEED LABS 90 tablet 1 01/30/2024   levothyroxine  (SYNTHROID ) 100 MCG tablet Take 1 tablet (100 mcg total) by mouth daily. 90 tablet 1 01/30/2024   linaclotide  (LINZESS ) 290 MCG CAPS capsule Take 1 capsule (290 mcg total) by mouth daily. 90 capsule 3 01/30/2024   metoprolol  tartrate (LOPRESSOR ) 25 MG tablet TAKE 1 TABLET BY MOUTH TWICE A DAY 180 tablet 0 01/30/2024   rosuvastatin  (CRESTOR ) 5 MG tablet Take 1 tablet (5 mg total) by mouth at bedtime. 100 tablet 3 01/29/2024   XARELTO  20 MG TABS tablet TAKE 1 TABLET BY MOUTH DAILY WITH SUPPER. 90 tablet 1 01/29/2024   zolpidem  (AMBIEN ) 5 MG tablet TAKE 1 TABLET BY MOUTH AT BEDTIME AS NEEDED FOR SLEEP. 90 tablet 1 01/29/2024   Scheduled:  cyanocobalamin   1,000 mcg Oral Daily   docusate sodium   100 mg Oral BID   ferrous sulfate   325 mg Oral Q breakfast   levothyroxine   100 mcg Oral Q0600  pantoprazole  (PROTONIX ) IV  40 mg Intravenous Q12H   [START ON 02/02/2024] rosuvastatin   5 mg Oral Q M,W,F   sodium chloride  flush  3 mL Intravenous Q12H   sodium chloride  flush  3 mL Intravenous Q12H   Continuous: PRN:acetaminophen  **OR** acetaminophen , ondansetron  **OR** ondansetron  (ZOFRAN ) IV, sodium chloride  flush  Assessment/Plan: 1) Acute on chronic iron  deficiency anemia with guaiac positive stools-history of bleeding gastric polyps in the past. An EGD is planned for tomorrow. He may benefit from an iron  infusion prior to discharge considering his severe anemia and his age with comorbidities. 2) Severe thrombocytopenia with vitamin B12 deficiency and  portal hypertensive gastropathy with esophageal varices. 3) Chest pain/Elevated troponin is probably secondary to demand ischemia. 4) HTN. 5) Chronic atrial fibrillation on Xarelto  which has been held for now. 6) Hypothyroidism.  7) Colonic diverticulosis.  LOS: 1 day   [name] 02/01/2024, 8:53 AM

## 2024-02-01 NOTE — Progress Notes (Signed)
   02/01/24 1605  TOC Brief Assessment  Insurance and Status Reviewed  Patient has primary care physician Yes  Home environment has been reviewed single family home  Prior level of function: independent  Prior/Current Home Services No current home services  Social Drivers of Health Review SDOH reviewed no interventions necessary  Readmission risk has been reviewed Yes  Transition of care needs transition of care needs identified, TOC will continue to follow    Heather Saltness, MSW, LCSW Clinical Social Worker Inpatient Care Management 02/01/2024 4:06 PM

## 2024-02-01 NOTE — Progress Notes (Signed)
 Educational information given to Mr. Luber about the EGD that will be done 02-02-24.

## 2024-02-01 NOTE — Plan of Care (Signed)
  Problem: Education: Goal: Knowledge of General Education information will improve Description: Including pain rating scale, medication(s)/side effects and non-pharmacologic comfort measures Outcome: Progressing   Problem: Clinical Measurements: Goal: Ability to maintain clinical measurements within normal limits will improve Outcome: Progressing Goal: Will remain free from infection Outcome: Progressing   Problem: Activity: Goal: Risk for activity intolerance will decrease Outcome: Progressing   Problem: Nutrition: Goal: Adequate nutrition will be maintained Outcome: Progressing   Problem: Coping: Goal: Level of anxiety will decrease Outcome: Progressing   Problem: Elimination: Goal: Will not experience complications related to bowel motility Outcome: Progressing   Problem: Pain Managment: Goal: General experience of comfort will improve and/or be controlled Outcome: Progressing   Problem: Safety: Goal: Ability to remain free from injury will improve Outcome: Progressing   Problem: Skin Integrity: Goal: Risk for impaired skin integrity will decrease Outcome: Progressing

## 2024-02-01 NOTE — Progress Notes (Signed)
 Progress Note   Patient: Joel Ford FMW:[id number] DOB: [birth year] DOA: 01/30/2024     1 DOS: the patient was seen and examined on 02/01/2024   Brief hospital course: 73yo with h/o afib on Xarelto , chronic thrombocytopenia, portal HTN with esophageal varices, HTN, and HLD who presented on 7/25 with SOB and CP.  Hgb 5.1, heme positive, GI consulted.  He was started on IV Protonix  and transfused 3 units PRBC.  Assessment and Plan:  Acute on chronic anemia with probable UGI bleeding Patient with chronic microcytic anemia, iron  deficiency and vitamin B12 anemia with GI bleed in 09/2022 related to portal hypertensive gastropathy and esophageal varices EGD on 10/03/22 with large varices s/p clipping and multiple gastric polyps and stigmata of recent bleeding that were removed (also with a 3 mm polyp in the rectum and sigmoid diverticulosis on colonoscopy); pathology of both stomach and rectal polyps was c/w hyperplastic polyps Presented with orthopnea, dyspnea and left-sided chest pressure and heaviness with associated cough Workup revealed low hemoglobin 5.4 (baseline hemoglobin around 7-13 variable over the course of last 1 year) Positive FOBT IV Protonix  BID GI consulted Transfused 3 units PRBC He is on Xarelto  and so will need to wait on EGD; last dose appears to have been 7/24 and so will make NPO after MN for EGD on 7/28   Elevated troponin Flat troponin 26, 25 EKG unremarkable for any ST-T wave change Elevated troponin in the setting of acute on chronic anemia/demand ischemia without MI   Pancytopenia Chronic thrombocytopenia but now with decreased counts along all cell lines Will consult heme/onc; Dr. Gudena to see This has been present intermittently in the past, as well Profound iron  deficiency so IV iron  was provided and will refer for ongoing outpatient IV iron  infusions   Essential hypertension Holding blood pressure regimen in setting of marginal BPs on presentation BP is  improving but he does not yet need resumption of medication (currently 126/79)   HLD Continue rosuvastatin  3x weekly   History of paroxysmal atrial fibrillation EKG showed normal sinus rhythm heart rate 94 Rate controlled but holding metoprolol  in the setting of marginal BPs Hold Xarelto    Hypothyroidism Continue Synthroid          Consultants: GI Oncology   Procedures: EGD 7/27   Antibiotics: None    30 Day Unplanned Readmission Risk Score    Flowsheet Row ED to Hosp-Admission (Current) from 01/30/2024 in [place] [facility]  30 Day Unplanned Readmission Risk Score (%) 13.25 Filed at 02/01/2024 0401    This score is the patient's risk of an unplanned readmission within 30 days of being discharged (0 -100%). The score is based on 73yo, lab data, medications, orders, and past utilization.   Low:  0-14.9   Medium: 15-21.9   High: 22-29.9   Extreme: 30 and above           Subjective: Feels much better, no longer symptomatic with movement.  Agrees with plan for ongoing monitoring and EGD tomorrow.   Objective: Vitals:   01/31/24 2112 02/01/24 0528  BP: 121/69 126/79  Pulse: 78 83  Resp: 16 18  Temp: (!) 97.5 F (36.4 C) 97.8 F (36.6 C)  SpO2: 98% 98%    Intake/Output Summary (Last 24 hours) at 02/01/2024 0737 Last data filed at 01/31/2024 1502 Gross per 24 hour  Intake 1029.51 ml  Output --  Net 1029.51 ml   Filed Weights   01/30/24 1857 01/31/24 0400  Weight: 95.3 kg 89.5  kg    Exam:  General:  Appears calm and comfortable and is in NAD Eyes:   normal lids, iris ENT:  grossly normal hearing, lips & tongue, mmm Cardiovascular:  RRR, 3/6 systolic murmur. No LE edema.  Respiratory:   CTA bilaterally with no wheezes/rales/rhonchi.  Normal respiratory effort. Abdomen:  soft, NT, ND Skin:  no rash or induration seen on limited exam Musculoskeletal:  grossly normal tone BUE/BLE, good ROM, no bony abnormality Psychiatric:  grossly  normal mood and affect, speech fluent and appropriate, AOx3 Neurologic:  CN 2-12 grossly intact, moves all extremities in coordinated fashion, sensation intact  Data Reviewed: I have reviewed the patient's lab results since admission.  Pertinent labs for today include:   Stable BMP Albumin 3.1 Bili 2.1 Iron  6, ferritin 4 WBC 2.8 Hgb 7.1 -> 7.4 Platelets 65 -> 64     Family Communication: Wife was present  Disposition: Status is: Inpatient Remains inpatient appropriate because: ongoing management     Time spent: 50 minutes  Unresulted Labs (From admission, onward)    None        Author: Delon Herald, MD 02/01/2024 7:37 AM  For on call review [email or web address].

## 2024-02-01 NOTE — Progress Notes (Signed)
 Subjective: Patient seems to be doing well today.  He denies having abdominal pain nausea or vomiting. There is no history of melena hematochezia. His hemoglobin is 7.1 g/dL.  He is laying comfortably in bed. He is questioning as to when he could go home.  Objective: Vital signs in last 24 hours: Temp:  [97.4 F (36.3 C)-98.1 F (36.7 C)] 97.8 F (36.6 C) (07/27 0528) Pulse Rate:  [74-83] 83 (07/27 0528) Resp:  [16-18] 18 (07/27 0528) BP: (99-126)/(64-79) 126/79 (07/27 0528) SpO2:  [94 %-100 %] 98 % (07/27 0528) Last BM Date : 01/30/24  Intake/Output from previous day: 07/26 0701 - 07/27 0700 In: 1439.5 [I.V.:707.5; Blood:732] Out: -  Intake/Output this shift: No intake/output data recorded.  General appearance: alert, cooperative, appears stated age, and no distress Resp: clear to auscultation bilaterally Cardio: regular rate and rhythm, S1, S2 normal, no murmur, click, rub or gallop GI: soft, non-tender; bowel sounds normal; no masses,  no organomegaly  Lab Results: Recent Labs    01/30/24 1859 01/31/24 1600  WBC 3.8* 2.8*  HGB 5.4* 7.1*  HCT 19.3* 24.4*  PLT 86* 65*   BMET Recent Labs    01/30/24 1859 01/31/24 1600  NA 140 137  K 3.9 3.8  CL 108 108  CO2 21* 20*  GLUCOSE 132* 99  BUN 19 16  CREATININE 1.12 0.90  CALCIUM  8.3* 8.1*   LFT Recent Labs    01/30/24 2046 01/31/24 1600  PROT 5.6* 5.5*  ALBUMIN 3.7 3.1*  AST 24 13*  ALT 7 10  ALKPHOS 73 63  BILITOT 0.5 2.1*  BILIDIR 0.2  --   IBILI 0.3  --    Studies/Results: DG Chest 2 View Result Date: 01/30/2024 CLINICAL DATA:  Chest pain EXAM: CHEST - 2 VIEW COMPARISON:  Chest x-ray 10/02/2022 FINDINGS: The heart size and mediastinal contours are within normal limits. Both lungs are clear. The visualized skeletal structures are unremarkable. IMPRESSION: No active cardiopulmonary disease. Electronically Signed   By: Greig Pique M.D.   On: 01/30/2024 20:06   Medications: I have reviewed the patient's  current medications. Prior to Admission:  Medications Prior to Admission  Medication Sig Dispense Refill Last Dose/Taking   allopurinol  (ZYLOPRIM ) 300 MG tablet Take 300 mg by mouth daily.   01/30/2024   cyanocobalamin  (VITAMIN B12) 1000 MCG tablet Take 1 tablet (1,000 mcg total) by mouth daily.   01/30/2024   ferrous sulfate  325 (65 FE) MG EC tablet TAKE 1 TABLET (325 MG TOTAL) BY MOUTH DAILY WITH BREAKFAST. NEED LABS 90 tablet 1 01/30/2024   levothyroxine  (SYNTHROID ) 100 MCG tablet Take 1 tablet (100 mcg total) by mouth daily. 90 tablet 1 01/30/2024   linaclotide  (LINZESS ) 290 MCG CAPS capsule Take 1 capsule (290 mcg total) by mouth daily. 90 capsule 3 01/30/2024   metoprolol  tartrate (LOPRESSOR ) 25 MG tablet TAKE 1 TABLET BY MOUTH TWICE A DAY 180 tablet 0 01/30/2024   rosuvastatin  (CRESTOR ) 5 MG tablet Take 1 tablet (5 mg total) by mouth at bedtime. 100 tablet 3 01/29/2024   XARELTO  20 MG TABS tablet TAKE 1 TABLET BY MOUTH DAILY WITH SUPPER. 90 tablet 1 01/29/2024   zolpidem  (AMBIEN ) 5 MG tablet TAKE 1 TABLET BY MOUTH AT BEDTIME AS NEEDED FOR SLEEP. 90 tablet 1 01/29/2024   Scheduled:  cyanocobalamin   1,000 mcg Oral Daily   docusate sodium   100 mg Oral BID   ferrous sulfate   325 mg Oral Q breakfast   levothyroxine   100 mcg Oral Q0600  pantoprazole  (PROTONIX ) IV  40 mg Intravenous Q12H   [START ON 02/02/2024] rosuvastatin   5 mg Oral Q M,W,F   sodium chloride  flush  3 mL Intravenous Q12H   sodium chloride  flush  3 mL Intravenous Q12H   Continuous: PRN:acetaminophen  **OR** acetaminophen , ondansetron  **OR** ondansetron  (ZOFRAN ) IV, sodium chloride  flush  Assessment/Plan: 1) Acute on chronic iron  deficiency anemia with guaiac positive stools-history of bleeding gastric polyps in the past. An EGD is planned for tomorrow. He may benefit from an iron  infusion prior to discharge considering his severe anemia and his age with comorbidities. 2) Severe thrombocytopenia with vitamin B12 deficiency and  portal hypertensive gastropathy with esophageal varices. 3) Chest pain/Elevated troponin is probably secondary to demand ischemia. 4) HTN. 5) Chronic atrial fibrillation on Xarelto  which has been held for now. 6) Hypothyroidism.  7) Colonic diverticulosis.  LOS: 1 day   [name] 02/01/2024, 8:53 AM

## 2024-02-01 NOTE — Plan of Care (Signed)
   Problem: Education: Goal: Knowledge of General Education information will improve Description Including pain rating scale, medication(s)/side effects and non-pharmacologic comfort measures Outcome: Progressing

## 2024-02-02 ENCOUNTER — Inpatient Hospital Stay (HOSPITAL_COMMUNITY): Admitting: Anesthesiology

## 2024-02-02 ENCOUNTER — Telehealth: Payer: Self-pay | Admitting: Pharmacist

## 2024-02-02 ENCOUNTER — Telehealth: Payer: Self-pay

## 2024-02-02 ENCOUNTER — Other Ambulatory Visit: Payer: Self-pay | Admitting: Gastroenterology

## 2024-02-02 ENCOUNTER — Encounter (HOSPITAL_COMMUNITY)
Admission: EM | Disposition: A | Discharge status: Home / Self Care | Payer: Self-pay | Source: Home / Self Care | Attending: Internal Medicine

## 2024-02-02 ENCOUNTER — Encounter (HOSPITAL_COMMUNITY): Payer: Self-pay | Admitting: Family Medicine

## 2024-02-02 DIAGNOSIS — D509 Iron deficiency anemia, unspecified: Secondary | ICD-10-CM | POA: Insufficient documentation

## 2024-02-02 DIAGNOSIS — I352 Nonrheumatic aortic (valve) stenosis with insufficiency: Secondary | ICD-10-CM

## 2024-02-02 DIAGNOSIS — D649 Anemia, unspecified: Secondary | ICD-10-CM | POA: Diagnosis not present

## 2024-02-02 DIAGNOSIS — E039 Hypothyroidism, unspecified: Secondary | ICD-10-CM | POA: Diagnosis not present

## 2024-02-02 DIAGNOSIS — I4891 Unspecified atrial fibrillation: Secondary | ICD-10-CM | POA: Diagnosis not present

## 2024-02-02 HISTORY — PX: ESOPHAGOGASTRODUODENOSCOPY: SHX5428

## 2024-02-02 LAB — TYPE AND SCREEN
ABO/RH(D): AB POS
Antibody Screen: NEGATIVE
Unit division: 0
Unit division: 0
Unit division: 0

## 2024-02-02 LAB — BPAM RBC
Blood Product Expiration Date: 202508222359
Blood Product Expiration Date: 202508222359
Blood Product Expiration Date: 202508222359
ISSUE DATE / TIME: 202507260131
ISSUE DATE / TIME: 202507260438
ISSUE DATE / TIME: 202507260743
Unit Type and Rh: 6200
Unit Type and Rh: 6200
Unit Type and Rh: 6200

## 2024-02-02 LAB — CBC
HCT: 27.3 % — ABNORMAL LOW (ref 39.0–52.0)
Hemoglobin: 7.8 g/dL — ABNORMAL LOW (ref 13.0–17.0)
MCH: 22.7 pg — ABNORMAL LOW (ref 26.0–34.0)
MCHC: 28.6 g/dL — ABNORMAL LOW (ref 30.0–36.0)
MCV: 79.6 fL — ABNORMAL LOW (ref 80.0–100.0)
Platelets: 71 K/uL — ABNORMAL LOW (ref 150–400)
RBC: 3.43 MIL/uL — ABNORMAL LOW (ref 4.22–5.81)
RDW: 18.3 % — ABNORMAL HIGH (ref 11.5–15.5)
WBC: 2.7 K/uL — ABNORMAL LOW (ref 4.0–10.5)
nRBC: 0.7 % — ABNORMAL HIGH (ref 0.0–0.2)

## 2024-02-02 SURGERY — EGD (ESOPHAGOGASTRODUODENOSCOPY)
Anesthesia: Monitor Anesthesia Care

## 2024-02-02 MED ORDER — LIDOCAINE 2% (20 MG/ML) 5 ML SYRINGE
INTRAMUSCULAR | Status: DC | PRN
  Administered 2024-02-02: 40 mg via INTRAVENOUS

## 2024-02-02 MED ORDER — PROPOFOL 1000 MG/100ML IV EMUL
INTRAVENOUS | Status: AC
  Filled 2024-02-02: qty 100

## 2024-02-02 MED ORDER — PROPOFOL 500 MG/50ML IV EMUL
INTRAVENOUS | Status: DC | PRN
  Administered 2024-02-02: 20 mg via INTRAVENOUS
  Administered 2024-02-02: 150 ug/kg/min via INTRAVENOUS

## 2024-02-02 MED ORDER — PROPOFOL 1000 MG/100ML IV EMUL
INTRAVENOUS | Status: AC
Start: 2024-02-02 — End: 2024-02-02
  Filled 2024-02-02: qty 100

## 2024-02-02 NOTE — Consult Note (Addendum)
 [facility]  Telephone:([phone] Fax:([phone]) [phone]    HEMATOLOGY CONSULTATION  PURPOSE OF CONSULTATION/CHIEF COMPLAINT:   Pancytopenia  Referring MD:  Dr. Redia   HPI: Joel Ford. Joel Ford is a 73 year old male patient who was admitted on 01/30/2024 with complaints of shortness of breath and chest pain.  Workup was done in the ED which showed pancytopenia.  Therefore hematology consult has been requested. Patient is seen, assessed and examined today.  Patient's spouse at bedside.  He is a very pleasant gentleman who details his chief complaints as being shortness of breath and weight in my chest which started 2 days prior to admission.   Acknowledges that he feels fine at this time.  Denies GI symptoms, or acute bleeding.  Patient reports that this is the second time he has been admitted with low blood counts within the last year.  Medical history includes A-fib on Xarelto , chronic thrombocytopenia, hypertension, chronic iron  deficiency anemia, and hyperlipidemia. Surgical history includes surgery on both knees approximately 10 years ago. Family history is noncontributory as patient states he was adopted. Social history is negative for tobacco use, denies illicit or recreational drug use, admits to occasional alcohol use in social setting.  Worked as a Company secretary in the past.    ASSESSMENT AND PLAN:  Pancytopenia: - May be secondary to iron  deficiency, B12 deficiency, chronic disease -Monitor CBC with differential closely - Consideration for bone marrow biopsy due to previous history of pancytopenia. - GI following.  EGD planned. - Hematology/Dr. Rhilee Currin will make further evaluation and treatment recommendations.  Anemia - hemoglobin 5.4 on admission. - Status post PRBC transfusion. - Hemoglobin improving 7.8 today - Recommend PRBC transfusion for Hgb <7.0  Thrombocytopenia -Platelets 71K today - Recommend platelet transfusion for counts <20 K or <50 K with  active bleeding  Leukopenia - WBC 2.7 with ANC 1.9 - No intervention required at this time  Iron  deficiency anemia - Ferritin low for with sat 1% - Status post IV iron  infusion x 1 7/27 - Recommend additional IV iron  infusions upon discharge  B12 deficiency - On B12 tablet 1000 mcg/day, continue as ordered - Monitor B12 level as outpatient  Chronic A-fib - Has been on Xarelto  which is on hold  Chest pain Shortness of breath Hypertension - Improving - Continue to monitor closely, Medicine managing    Past Medical History:  Diagnosis Date   ABNORMAL ELECTROCARDIOGRAM 07/04/2009   Qualifier: Diagnosis of  By: Baird FNP, [name]    Atrial fibrillation (HCC)    Esophageal varices (HCC)    Gastric polyps    Gout    HYPERGLYCEMIA 07/27/2009   Qualifier: Diagnosis of  By: Bartley MD, Lamar Mulch    HYPERLIPIDEMIA 07/04/2009   Qualifier: Diagnosis of  By: Baird FNP, [name]    Left knee DJD    OA   Portal hypertensive gastropathy (HCC)    RENAL CALCULUS 02/15/2008   Qualifier: Diagnosis of  By: Baird FNP, [name]    S/P total knee replacement 08/07/2009   right   Thyroid  disease   :  Past Surgical History:  Procedure Laterality Date   APPENDECTOMY     age 69   CARDIAC CATHETERIZATION  01/23/2005   Normal   COLONOSCOPY WITH PROPOFOL  N/A 10/03/2022   Procedure: COLONOSCOPY WITH PROPOFOL ;  Surgeon: Rollin Dover, MD;  Location: [facility];  Service: Gastroenterology;  Laterality: N/A;   ESOPHAGOGASTRODUODENOSCOPY (EGD) WITH PROPOFOL  N/A 10/03/2022   Procedure: ESOPHAGOGASTRODUODENOSCOPY (EGD)  WITH PROPOFOL ;  Surgeon: Rollin Dover, MD;  Location: [facility];  Service: Gastroenterology;  Laterality: N/A;   FINGER SURGERY     RIGHT INDEX, REATTACHED   HEMOSTASIS CLIP PLACEMENT  10/03/2022   Procedure: HEMOSTASIS CLIP PLACEMENT;  Surgeon: Rollin Dover, MD;  Location: [facility];  Service: Gastroenterology;;   orthoscopy     Right  and left knee   POLYPECTOMY  10/03/2022   Procedure: POLYPECTOMY;  Surgeon: Rollin Dover, MD;  Location: [facility];  Service: Gastroenterology;;   TOTAL KNEE ARTHROPLASTY  2011   Right   TOTAL KNEE ARTHROPLASTY Left 09/21/2012   Dr [name]   TOTAL KNEE ARTHROPLASTY Left 09/21/2012   Procedure: TOTAL KNEE ARTHROPLASTY;  Surgeon: [name], MD;  Location: [facility];  Service: Orthopedics;  Laterality: Left;  :  No Known Allergies:   Family History  Problem Relation Age of Onset   Stroke Father    Healthy Mother    Healthy Brother    Healthy Son    Colon cancer Neg Hx    Prostate cancer Neg Hx   :   Social History   Socioeconomic History   Marital status: Married    Spouse name: Not on file   Number of children: 1   Years of education: Not on file   Highest education level: Not on file  Occupational History   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     [employer].     Married 1977   Social Drivers of Corporate investment banker Strain: Not on file  Food Insecurity: No Food Insecurity (01/31/2024)   Hunger Vital Sign    Worried About Running Out of Food in the Last Year: Never true    Ran Out of Food in the Last Year: Never true  Transportation Needs: No Transportation Needs (01/31/2024)   PRAPARE - Administrator, Civil Service (Medical): No    Lack of Transportation (Non-Medical): No  Physical Activity: Not on file  Stress: Not on file  Social Connections: Socially Integrated (01/31/2024)   Social Connection and Isolation Panel    Frequency of Communication with Friends and Family: Three times a week    Frequency of Social Gatherings  with Friends and Family: Three times a week    Attends Religious Services: 1 to 4 times per year    Active Member of Clubs or Organizations: No    Attends Banker Meetings: 1 to 4 times per year    Marital Status: Married  Catering manager Violence: Not At Risk (01/31/2024)   Humiliation, Afraid, Rape, and Kick questionnaire    Fear of Current or Ex-Partner: No    Emotionally Abused: No    Physically Abused: No    Sexually Abused: No  :   CURRENT MEDS: Current Facility-Administered Medications  Medication Dose Route Frequency Provider Last Rate Last Admin   0.9 %  sodium chloride  infusion   Intravenous Continuous Mann, Jyothi, MD       acetaminophen  (TYLENOL ) tablet 650 mg  650 mg Oral Q6H PRN Sundil, Subrina, MD       Or  acetaminophen  (TYLENOL ) suppository 650 mg  650 mg Rectal Q6H PRN Sundil, Subrina, MD       cyanocobalamin  (VITAMIN B12) tablet 1,000 mcg  1,000 mcg Oral Daily Sundil, Subrina, MD   1,000 mcg at 02/01/24 9073   docusate sodium  (COLACE) capsule 100 mg  100 mg Oral BID Sundil, Subrina, MD   100 mg at 02/01/24 2159   ferrous sulfate  tablet 325 mg  325 mg Oral Q breakfast Sundil, Subrina, MD   325 mg at 02/01/24 9071   lactated ringers  infusion   Intravenous Continuous Barbarann Nest, MD 75 mL/hr at 02/02/24 0004 New Bag at 02/02/24 0004   levothyroxine  (SYNTHROID ) tablet 100 mcg  100 mcg Oral Q0600 Sundil, Subrina, MD   100 mcg at 02/01/24 9391   mupirocin  ointment (BACTROBAN ) 2 % 1 Application  1 Application Nasal BID Barbarann Nest, MD   1 Application at 02/01/24 2158   ondansetron  (ZOFRAN ) tablet 4 mg  4 mg Oral Q6H PRN Sundil, Subrina, MD       Or   ondansetron  (ZOFRAN ) injection 4 mg  4 mg Intravenous Q6H PRN Sundil, Subrina, MD       pantoprazole  (PROTONIX ) injection 40 mg  40 mg Intravenous Q12H Sundil, Subrina, MD   40 mg at 02/01/24 2159   rosuvastatin  (CRESTOR ) tablet 5 mg  5 mg Oral Q M,W,F Barbarann Nest, MD       sodium chloride  flush (NS)  0.9 % injection 3 mL  3 mL Intravenous Q12H Sundil, Subrina, MD   3 mL at 02/01/24 2159   sodium chloride  flush (NS) 0.9 % injection 3 mL  3 mL Intravenous Q12H Sundil, Subrina, MD   3 mL at 02/01/24 2159   sodium chloride  flush (NS) 0.9 % injection 3 mL  3 mL Intravenous PRN Sundil, Subrina, MD        PHYSICAL EXAMINATION: ECOG PERFORMANCE STATUS: 2 - Symptomatic, <50% confined to bed  Vitals:   02/01/24 2033 02/02/24 0457  BP: 112/68 118/77  Pulse: 72 79  Resp: 16 16  Temp: 98.2 F (36.8 C) 98.8 F (37.1 C)  SpO2: 97% 97%   Filed Weights   01/30/24 1857 01/31/24 0400  Weight: 210 lb (95.3 kg) 197 lb 4.8 oz (89.5 kg)    GENERAL: alert, no distress and comfortable SKIN: +pale skin color, texture, turgor are normal, no rashes or significant lesions EYES: normal, conjunctiva are pink and non-injected, sclera clear OROPHARYNX: no exudate, no erythema and lips, buccal mucosa, and tongue normal  NECK: supple, thyroid  normal size, non-tender, without nodularity LYMPH: no palpable lymphadenopathy in the cervical, axillary or inguinal LUNGS: clear to auscultation and percussion with normal breathing effort HEART: regular rate & rhythm and no murmurs and no lower extremity edema ABDOMEN: abdomen soft, non-tender and normal bowel sounds MUSCULOSKELETAL: no cyanosis of digits and no clubbing  PSYCH: alert & oriented x 3 with fluent speech NEURO: no focal motor/sensory deficits   LABS: Lab Results  Component Value Date   WBC 2.5 (L) 02/01/2024   HGB 7.4 (L) 02/01/2024   HCT 26.3 (L) 02/01/2024   MCV 79.7 (L) 02/01/2024   PLT 64 (L) 02/01/2024    Lab Results  Component Value Date   WBC 2.5 (L) 02/01/2024   HGB 7.4 (L) 02/01/2024   HCT 26.3 (L) 02/01/2024   PLT 64 (L) 02/01/2024   GLUCOSE 99 01/31/2024   CHOL 204 (H) 03/03/2017   TRIG 173 (H) 03/03/2017   HDL 34 (L) 03/03/2017  LDLDIRECT 179.9 07/04/2009   LDLCALC 135 (H) 03/03/2017   ALT 10 01/31/2024   AST 13 (L)  01/31/2024   NA 137 01/31/2024   K 3.8 01/31/2024   CL 108 01/31/2024   CREATININE 0.90 01/31/2024   BUN 16 01/31/2024   CO2 20 (L) 01/31/2024   PSA 1.56 02/12/2005   INR 3.1 (H) 10/02/2022   HGBA1C 5.4 10/14/2022    DG Chest 2 View Result Date: 01/30/2024 CLINICAL DATA:  Chest pain EXAM: CHEST - 2 VIEW COMPARISON:  Chest x-ray 10/02/2022 FINDINGS: The heart size and mediastinal contours are within normal limits. Both lungs are clear. The visualized skeletal structures are unremarkable. IMPRESSION: No active cardiopulmonary disease. Electronically Signed   By: Greig Pique M.D.   On: 01/30/2024 20:06     The total time spent in the appointment was 55 minutes encounter with patients including review of chart and various tests results, discussions about plan of care and coordination of care plan   All questions were answered. The patient knows to call the clinic with any problems, questions or concerns. No barriers to learning was detected.  Thank you for the courtesy of this consultation, Olam JINNY Brunner, NP  7/28/20259:41 AM  Attending Note  I personally saw the patient, reviewed the chart and examined the patient. The plan of care was discussed with the patient and the admitting team. I agree with the assessment and plan as documented above. Thank you very much for the consultation. Anemia: Suspected to be GI bleed but upper endoscopy did not reveal any active bleeding.  Patient did however have varices suggestive of portal hypertension.  Dr.Mann plans to do an outpatient banding procedure. Our plan is to administer IV iron  therapy for the patient. We will discuss as outpatient about the role of bone marrow biopsy.  I did not see any clear-cut indication for doing it as inpatient. It appears that the patient may be discharged home tomorrow.  We would like to set up an appointment in 1 week for labs and follow-up.

## 2024-02-02 NOTE — Interval H&P Note (Signed)
 History and Physical Interval Note:  02/02/2024 2:06 PM  Joel Ford  has presented today for surgery, with the diagnosis of ANEMIA.  The various methods of treatment have been discussed with the patient and family. After consideration of risks, benefits and other options for treatment, the patient has consented to  Procedure(s) with comments: EGD (ESOPHAGOGASTRODUODENOSCOPY) (N/A) - ANEMIA as a surgical intervention.  The patient's history has been reviewed, patient examined, no change in status, stable for surgery.  I have reviewed the patient's chart and labs.  Questions were answered to the patient's satisfaction.     [name]

## 2024-02-02 NOTE — Progress Notes (Signed)
 Dr. Gardiner notified Mr Camargo medications being held, Patient is NPO for planned EGD today. He agreed.

## 2024-02-02 NOTE — Telephone Encounter (Signed)
 Auth Submission: NO AUTH NEEDED Site of care: Site of care: [facility] Payer: [employer] Medication & CPT/J Code(s) submitted: Feraheme (ferumoxytol) [id number] Diagnosis Code:  Route of submission (phone, fax, portal):  Phone # Fax # Auth type: Buy/Bill PB Units/visits requested: 510mg  x 2 doses Reference number:  Approval from: 02/02/24 to 06/04/24

## 2024-02-02 NOTE — Progress Notes (Signed)
 Progress Note   Patient: Joel Ford FMW:[id number] DOB: 1950-11-20 DOA: 01/30/2024     2 DOS: the patient was seen and examined on 02/02/2024   Brief hospital course: 73yo with h/o afib on Xarelto , chronic thrombocytopenia, portal HTN with esophageal varices, HTN, and HLD who presented on 7/25 with SOB and CP.  Hgb 5.1, heme positive, GI consulted.  He was started on IV Protonix  and transfused 3 units PRBC.  Assessment and Plan:  Acute on chronic anemia with probable UGI bleeding Patient with chronic microcytic anemia, iron  deficiency and vitamin B12 anemia with GI bleed in 73/2024 related to portal hypertensive gastropathy and esophageal varices EGD on 10/03/22 with large varices s/p clipping and multiple gastric polyps and stigmata of recent bleeding that were removed (also with a 3 mm polyp in the rectum and sigmoid diverticulosis on colonoscopy); pathology of both stomach and rectal polyps was c/w hyperplastic polyps Presented with orthopnea, dyspnea and left-sided chest pressure and heaviness with associated cough Workup revealed low hemoglobin 5.4 (baseline hemoglobin around 7-13 variable over the course of last 1 year) Positive FOBT IV Protonix  BID GI consulted Transfused 3 units PRBC Xarelto  was held, patient had EGD 02/02/2024, it shows grade 2 esophageal varices with no stigmata of recent bleeding, multiple gastric polyps, normal duodenum, no specimens collected. GI recommends outpatient repeat EGD in a couple of weeks or so (tentatively August 8) for banding.  Discussed with patient's cardiologist, Dr. Pietro, and Xarelto  can be held for 48 hours prior to procedure from cardiology standpoint.   Elevated troponin Flat troponin 26, 25 EKG unremarkable for any ST-T wave change Elevated troponin in the setting of acute on chronic anemia/demand ischemia without MI   Pancytopenia Chronic thrombocytopenia but now with decreased counts along all cell lines Appreciate  oncology/hematology input, notes reviewed. This has been present intermittently in the past, as well Profound iron  deficiency so IV iron  was provided and will refer for ongoing outpatient IV iron  infusions Oncology is planning on possibly outpatient bone marrow biopsy.   Essential hypertension Holding blood pressure regimen in setting of marginal BPs on presentation BP is improving but he does not yet need resumption of medication (currently 126/79)   HLD Continue rosuvastatin  3x weekly   History of paroxysmal atrial fibrillation EKG showed normal sinus rhythm heart rate 94 Rate controlled but holding metoprolol  in the setting of marginal BPs Xarelto  has been on hold, consider restarting upon discharge if okay with GI.  Can be held for 48 hours prior to repeat EGD per cardiology.   Hypothyroidism Continue Synthroid          Consultants: GI Oncology   Procedures: EGD 7/27   Antibiotics: None    30 Day Unplanned Readmission Risk Score    Flowsheet Row ED to Hosp-Admission (Current) from 01/30/2024 in [facility]  30 Day Unplanned Readmission Risk Score (%) 13.25 Filed at 02/01/2024 0401    This score is the patient's risk of an unplanned readmission within 30 days of being discharged (0 -100%). The score is based on dignosis, age, lab data, medications, orders, and past utilization.   Low:  0-14.9   Medium: 15-21.9   High: 22-29.9   Extreme: 30 and above           Subjective:   Patient reports feeling okay today, denies any symptoms of lightheadedness, dizziness, abdominal pain, nausea or vomiting.  Has not noticed any obvious bleeding.  Objective: Vitals:   02/02/24 1453 02/02/24 1538  BP:  95/79 117/78  Pulse: 80 78  Resp: 18 18  Temp: (!) 97.5 F (36.4 C)   SpO2: 97%     Intake/Output Summary (Last 24 hours) at 02/02/2024 1737 Last data filed at 02/02/2024 1421 Gross per 24 hour  Intake 1796.75 ml  Output --  Net 1796.75 ml   Filed  Weights   01/30/24 1857 01/31/24 0400 02/02/24 1327  Weight: 95.3 kg 89.5 kg 89.5 kg    Exam:  General:  Appears calm and comfortable and is in NAD, laying in the bed comfortably Eyes:   normal lids, iris ENT:  grossly normal hearing, lips & tongue, mmm Cardiovascular:  RRR, 3/6 systolic murmur. No LE edema.  Respiratory:   CTA bilaterally with no wheezes/rales/rhonchi.  Normal respiratory effort. Abdomen:  soft, NT, ND Skin:  no rash or induration seen on limited exam Musculoskeletal:  grossly normal tone  Psychiatric:  grossly normal mood and affect,  Neurologic:  CN 2-12 grossly intact, moves all extremities in coordinated fashion, sensation intact  Data Reviewed: I have reviewed the patient's lab results since admission.    Family Communication: Wife was present  Disposition: Status is: Inpatient Remains inpatient appropriate because: ongoing management     Time spent: 50 minutes  Unresulted Labs (From admission, onward)    None        Author: MARGARETMARY DELENA HALEY, MD 02/02/2024 5:37 PM  For on call review [email or web address].

## 2024-02-02 NOTE — Telephone Encounter (Signed)
 Patient referred to infusion pharmacy team for ambulatory infusion of IV iron .  Insurance - Financial planner of care - Site of care: [facility] Dx code - D50.0  IV Iron  Therapy - Feraheme 510 mg IV x 2. Patient received Venofer  200 mg IV x 1 as an inpatient  Infusion appointments - Scheduling team will schedule patient as soon as possible.    Joel Ford, PharmD

## 2024-02-02 NOTE — Plan of Care (Signed)
  Problem: Education: Goal: Knowledge of General Education information will improve Description: Including pain rating scale, medication(s)/side effects and non-pharmacologic comfort measures Outcome: Progressing   Problem: Health Behavior/Discharge Planning: Goal: Ability to manage health-related needs will improve Outcome: Progressing   Problem: Clinical Measurements: Goal: Ability to maintain clinical measurements within normal limits will improve Outcome: Progressing Goal: Will remain free from infection Outcome: Progressing Goal: Diagnostic test results will improve Outcome: Progressing Goal: Cardiovascular complication will be avoided Outcome: Progressing   Problem: Nutrition: Goal: Adequate nutrition will be maintained Outcome: Progressing   Problem: Pain Managment: Goal: General experience of comfort will improve and/or be controlled Outcome: Progressing   Problem: Safety: Goal: Ability to remain free from injury will improve Outcome: Progressing

## 2024-02-02 NOTE — Progress Notes (Signed)
 Mobility Specialist - Progress Note   02/02/24 0956  Mobility  Activity Ambulated with assistance in hallway  Level of Assistance Modified independent, requires aide device or extra time  Assistive Device Other (Comment) (IV Pole)  Distance Ambulated (ft) 500 ft  Activity Response Tolerated well  Mobility Referral Yes  Mobility visit 1 Mobility  Mobility Specialist Start Time (ACUTE ONLY) [id number]  Mobility Specialist Stop Time (ACUTE ONLY) 0955  Mobility Specialist Time Calculation (min) (ACUTE ONLY) 6 min   Pt received in bed and agreeable to mobility. No complaints during session. Pt to bed after session with all needs met.    [facility]

## 2024-02-02 NOTE — Transfer of Care (Signed)
 Immediate Anesthesia Transfer of Care Note  Patient: Joel Ford [facility]  Procedure(s) Performed: EGD (ESOPHAGOGASTRODUODENOSCOPY)  Patient Location: PACU  Anesthesia Type:MAC  Level of Consciousness: drowsy  Airway & Oxygen Therapy: Patient Spontanous Breathing and Patient connected to face mask oxygen  Post-op Assessment: Report given to RN, Post -op Vital signs reviewed and stable, and Patient moving all extremities X 4  Post vital signs: Reviewed and stable  Last Vitals:  Vitals Value Taken Time  BP 129/76   Temp    Pulse 93   Resp 18 02/02/24 14:21  SpO2 98   Vitals shown include unfiled device data.  Last Pain:  Vitals:   02/02/24 1327  TempSrc: Temporal  PainSc: 0-No pain         Complications: No notable events documented.

## 2024-02-02 NOTE — Anesthesia Preprocedure Evaluation (Addendum)
 Anesthesia Evaluation  Patient identified by MRN, date of birth, ID band Patient awake    Reviewed: Allergy & Precautions, NPO status , Patient's Chart, lab work & pertinent test results, reviewed documented beta blocker date and time   Airway Mallampati: III  TM Distance: >3 FB Neck ROM: Full    Dental  (+) Dental Advisory Given, Poor Dentition, Missing   Pulmonary neg pulmonary ROS   Pulmonary exam normal breath sounds clear to auscultation       Cardiovascular hypertension, Pt. on home beta blockers and Pt. on medications Normal cardiovascular exam+ dysrhythmias Atrial Fibrillation + Valvular Problems/Murmurs (mild AS, mild AI) AS and AI  Rhythm:Regular Rate:Normal  TTE 2024 1. Calcified aortic valve with fixed right coronary cusp; mild AS with  mean gradient 13 mmHg and mild AI; apical hypertrophy with small area of  apical akinesis; suggest cardiac MRI to R/O apical HCM.   2. Left ventricular ejection fraction, by estimation, is 70 to 75%. The  left ventricle has hyperdynamic function. The left ventricle has no  regional wall motion abnormalities. There is moderate left ventricular  hypertrophy. Left ventricular diastolic  parameters are consistent with Grade I diastolic dysfunction (impaired  relaxation). Elevated left atrial pressure.   3. Right ventricular systolic function is normal. The right ventricular  size is normal. There is normal pulmonary artery systolic pressure.   4. Left atrial size was moderately dilated.   5. The mitral valve is normal in structure. Mild mitral valve  regurgitation. No evidence of mitral stenosis.   6. The aortic valve is normal in structure. Aortic valve regurgitation is  mild. Mild aortic valve stenosis.   7. The inferior vena cava is normal in size with greater than 50%  respiratory variability, suggesting right atrial pressure of 3 mmHg.     Neuro/Psych negative neurological ROS   negative psych ROS   GI/Hepatic negative GI ROS,,,Portal HTN with esophageal varices   Endo/Other  Hypothyroidism    Renal/GU negative Renal ROS  negative genitourinary   Musculoskeletal  (+) Arthritis ,    Abdominal   Peds  Hematology  (+) Blood dyscrasia (xarelto ), anemia Lab Results      Component                Value               Date                      WBC                      2.7 (L)             02/02/2024                HGB                      7.8 (L)             02/02/2024                HCT                      27.3 (L)            02/02/2024                MCV  79.6 (L)            02/02/2024                PLT                      71 (L)              02/02/2024              Anesthesia Other Findings 73yo with h/o afib on Xarelto , chronic thrombocytopenia, portal HTN with esophageal varices, HTN, and HLD who presented on 7/25 with SOB and CP.  Hgb 5.1, heme positive, GI consulted  Reproductive/Obstetrics                              Anesthesia Physical Anesthesia Plan  ASA: 3  Anesthesia Plan: MAC   Post-op Pain Management:    Induction: Intravenous  PONV Risk Score and Plan: Propofol  infusion and Treatment may vary due to age or medical condition  Airway Management Planned: Natural Airway  Additional Equipment:   Intra-op Plan:   Post-operative Plan:   Informed Consent: I have reviewed the patients History and Physical, chart, labs and discussed the procedure including the risks, benefits and alternatives for the proposed anesthesia with the patient or authorized representative who has indicated his/her understanding and acceptance.     Dental advisory given  Plan Discussed with: CRNA  Anesthesia Plan Comments:          Anesthesia Quick Evaluation

## 2024-02-02 NOTE — Op Note (Signed)
 [facility] Patient Name: Joel Ford Procedure Date: 02/02/2024 MRN: [id number] Attending MD: [name] , MD, [id number] Date of Birth: Mar 07, 1951 CSN: [id number] Age: 73 Admit Type: Inpatient Procedure:                Diagnostic EGD. Indications:              Iron  deficiency anemia, Heme positive stool,                            History of esophageal varices. Providers:                [name], MD, Darleene Bare, RN, Corky Czech,                            Technician, Almarie Hopping, CRNA, Armida FELIX                            [place], CRNA Referring MD:             Juliane Pines, MD Medicines:                Monitored Anesthesia Care Complications:            No immediate complications. Estimated Blood Loss:     Estimated blood loss: none. Procedure:                Pre-Anesthesia Assessment:- Prior to the procedure,                            a history and physical was performed, and patient                            medications and allergies were reviewed. The                            patient's tolerance of previous anesthesia was also                            reviewed. The risks and benefits of the procedure                            and the sedation options and risks were discussed                            with the patient. All questions were answered, and                            informed consent was obtained. Prior                            Anticoagulants: The patient has taken Xarelto                             (rivaroxaban ), last dose was 3 days prior to  procedure. ASA Grade Assessment: III - A patient                            with severe systemic disease. After reviewing the                            risks and benefits, the patient was deemed in                            satisfactory condition to undergo the procedure.                            After obtaining informed consent, the endoscope was                             passed under direct vision. Throughout the                            procedure, the patient's blood pressure, pulse, and                            oxygen saturations were monitored continuously. The                            [id number]-[id number] ([id number]) Olympus endoscope was introduced                            through the mouth, and advanced to the second part                            of duodenum. The EGD was accomplished without                            difficulty. The patient tolerated the procedure                            well. Scope In: Scope Out: Findings:      Four columns of grade II varices with no stigmata of recent bleeding       were found in the entire esophagus, 40 cm from the incisors. They were 5       mm in largest diameter. No red wale signs were present.      Multiple sessile polyps with no bleeding and no stigmata of recent       bleeding were found in the cardia, in the gastric fundus and in the       gastric body; no fresh or old heme noted on exam.      The cardia and gastric fundus were otherwise normal on retroflexion.      The examined duodenum was normal. Impression:               - Grade II esophageal varices with no stigmata of                            recent bleeding.                           -  Multiple gastric polyps.                           - Normal examined duodenum.                           - No specimens collected. Moderate Sedation:      MAC used. Recommendation:           - Resume regular diet daily.                           - Continue present medications.                           - Return to Dr. Curtis office in 1 week. Procedure Code(s):        --- Professional ---                           [id number], Esophagogastroduodenoscopy, flexible,                            transoral; diagnostic, including collection of                            specimen(s) by brushing or washing, when performed                            (separate  procedure) Diagnosis Code(s):        --- Professional ---                           D50.9, Iron  deficiency anemia, unspecified                           R19.5, Other fecal abnormalities                           I85.01, Esophageal varices with bleeding                           K31.7, Polyp of stomach and duodenum CPT copyright 2022 [employer]. All rights reserved. The codes documented in this report are preliminary and upon coder review may  be revised to meet current compliance requirements. [name], MD [name], MD 02/02/2024 2:46:21 PM This report has been signed electronically. Number of Addenda: 0

## 2024-02-03 ENCOUNTER — Encounter (HOSPITAL_COMMUNITY): Payer: Self-pay | Admitting: Gastroenterology

## 2024-02-03 DIAGNOSIS — D649 Anemia, unspecified: Secondary | ICD-10-CM | POA: Diagnosis not present

## 2024-02-03 LAB — CBC WITH DIFFERENTIAL/PLATELET
Abs Immature Granulocytes: 0.02 K/uL (ref 0.00–0.07)
Basophils Absolute: 0 K/uL (ref 0.0–0.1)
Basophils Relative: 1 %
Eosinophils Absolute: 0.1 K/uL (ref 0.0–0.5)
Eosinophils Relative: 4 %
HCT: 26.8 % — ABNORMAL LOW (ref 39.0–52.0)
Hemoglobin: 7.6 g/dL — ABNORMAL LOW (ref 13.0–17.0)
Immature Granulocytes: 1 %
Lymphocytes Relative: 12 %
Lymphs Abs: 0.4 K/uL — ABNORMAL LOW (ref 0.7–4.0)
MCH: 23 pg — ABNORMAL LOW (ref 26.0–34.0)
MCHC: 28.4 g/dL — ABNORMAL LOW (ref 30.0–36.0)
MCV: 81.2 fL (ref 80.0–100.0)
Monocytes Absolute: 0.4 K/uL (ref 0.1–1.0)
Monocytes Relative: 12 %
Neutro Abs: 2 K/uL (ref 1.7–7.7)
Neutrophils Relative %: 70 %
Platelets: 69 K/uL — ABNORMAL LOW (ref 150–400)
RBC: 3.3 MIL/uL — ABNORMAL LOW (ref 4.22–5.81)
RDW: 19.4 % — ABNORMAL HIGH (ref 11.5–15.5)
WBC: 2.9 K/uL — ABNORMAL LOW (ref 4.0–10.5)
nRBC: 0 % (ref 0.0–0.2)

## 2024-02-03 LAB — BASIC METABOLIC PANEL WITH GFR
Anion gap: 7 (ref 5–15)
BUN: 13 mg/dL (ref 8–23)
CO2: 24 mmol/L (ref 22–32)
Calcium: 7.9 mg/dL — ABNORMAL LOW (ref 8.9–10.3)
Chloride: 107 mmol/L (ref 98–111)
Creatinine, Ser: 1.02 mg/dL (ref 0.61–1.24)
GFR, Estimated: >60 mL/min (ref >60)
Glucose, Bld: 88 mg/dL (ref 70–99)
Potassium: 3.5 mmol/L (ref 3.5–5.1)
Sodium: 138 mmol/L (ref 135–145)

## 2024-02-03 MED ORDER — PANTOPRAZOLE SODIUM 40 MG PO TBEC: 40.0000 mg | DELAYED_RELEASE_TABLET | Freq: Every day | ORAL | 3 refills | Status: AC

## 2024-02-03 MED ORDER — METOPROLOL TARTRATE 25 MG PO TABS: 12.5000 mg | ORAL_TABLET | Freq: Two times a day (BID) | ORAL | 0 refills | Status: DC

## 2024-02-03 MED ORDER — DOCUSATE SODIUM 100 MG PO CAPS: 100.0000 mg | ORAL_CAPSULE | Freq: Two times a day (BID) | ORAL | 0 refills | Status: DC

## 2024-02-03 MED ORDER — ACETAMINOPHEN 325 MG PO TABS: 650.0000 mg | ORAL_TABLET | Freq: Four times a day (QID) | ORAL | 0 refills | Status: DC | PRN

## 2024-02-03 NOTE — Plan of Care (Signed)
   Problem: Education: Goal: Knowledge of General Education information will improve Description Including pain rating scale, medication(s)/side effects and non-pharmacologic comfort measures Outcome: Progressing   Problem: Health Behavior/Discharge Planning: Goal: Ability to manage health-related needs will improve Outcome: Progressing

## 2024-02-03 NOTE — Plan of Care (Signed)

## 2024-02-03 NOTE — Discharge Summary (Signed)
 Physician Discharge Summary   Patient: Joel Ford MRN: [id number] DOB: 25-Sep-1950  Admit date:     01/30/2024  Discharge date: 02/03/24  Discharge Physician: Drue ONEIDA Potter   PCP: Antoniette Vermell CROME, PA-C   Recommendations at discharge:  Follow-up with oncology as well as gastroenterology Concerning your repeat EGD you are to hold Xarelto  2 days before the EGD  Discharge Diagnoses: Upper GI bleeding  Hospital Course:  73yo with h/o afib on Xarelto , chronic thrombocytopenia, portal HTN with esophageal varices, HTN, and HLD who presented on 7/25 with SOB and CP. Hgb 5.1, heme positive, GI consulted. He was started on IV Protonix  and transfused 3 units PRBC.   Other hospital course as noted below Acute on chronic anemia with probable UGI bleeding Patient with chronic microcytic anemia, iron  deficiency and vitamin B12 anemia with GI bleed in 09/2022 related to portal hypertensive gastropathy and esophageal varices EGD on 10/03/22 with large varices s/p clipping and multiple gastric polyps and stigmata of recent bleeding that were removed (also with a 3 mm polyp in the rectum and sigmoid diverticulosis on colonoscopy); pathology of both stomach and rectal polyps was c/w hyperplastic polyps Presented with orthopnea, dyspnea and left-sided chest pressure and heaviness with associated cough Workup revealed low hemoglobin 5.4 (baseline hemoglobin around 7-13 variable over the course of last 1 year) Positive FOBT Patient received IV Protonix  GI consulted and underwent EGD 02/02/2024, it shows grade 2 esophageal varices with no stigmata of recent bleeding, multiple gastric polyps, normal duodenum, no specimens collected. GI recommends outpatient repeat EGD in a couple of weeks or so (tentatively August 8) for banding.  It was discussed with patient's cardiologist, Dr. Pietro, and Xarelto  can be held for 48 hours prior to procedure from cardiology standpoint.  Patient has been informed of this.    Elevated troponin Flat troponin 26, 25 EKG unremarkable for any ST-T wave change Elevated troponin in the setting of acute on chronic anemia/demand ischemia without MI   Pancytopenia Chronic thrombocytopenia but now with decreased counts along all cell lines Appreciate oncology/hematology input, notes reviewed. This has been present intermittently in the past, as well Profound iron  deficiency so IV iron  was provided and will refer for ongoing outpatient IV iron  infusions Oncology oncology is on board and I discussed with Dr. Gara team and they will be following patient up at the clinic.   Essential hypertension Lower dose of metoprolol  resume as patient has softer blood pressure than usual   HLD Continue rosuvastatin  3x weekly   History of paroxysmal atrial fibrillation Continue reduced dose of metoprolol  Can be held for 48 hours prior to repeat EGD per cardiology. Patient has been counseled on risks of resuming Xarelto  and he agrees.   Hypothyroidism Continue Synthroid   Consultants: GI, oncology Procedures performed: EGD Disposition: Home Diet recommendation:  Cardiac diet DISCHARGE MEDICATION: Allergies as of 02/03/2024   No Known Allergies      Medication List     TAKE these medications    acetaminophen  325 MG tablet Commonly known as: TYLENOL  Take 2 tablets (650 mg total) by mouth every 6 (six) hours as needed for mild pain (pain score 1-3) or fever (or Fever >/= 101).   allopurinol  300 MG tablet Commonly known as: ZYLOPRIM  Take 300 mg by mouth daily.   cyanocobalamin  1000 MCG tablet Commonly known as: VITAMIN B12 Take 1 tablet (1,000 mcg total) by mouth daily.   docusate sodium  100 MG capsule Commonly known as: COLACE Take 1 capsule (100 mg  total) by mouth 2 (two) times daily.   ferrous sulfate  325 (65 FE) MG EC tablet TAKE 1 TABLET (325 MG TOTAL) BY MOUTH DAILY WITH BREAKFAST. NEED LABS   levothyroxine  100 MCG tablet Commonly known as:  SYNTHROID  Take 1 tablet (100 mcg total) by mouth daily.   linaclotide  290 MCG Caps capsule Commonly known as: Linzess  Take 1 capsule (290 mcg total) by mouth daily.   metoprolol  tartrate 25 MG tablet Commonly known as: LOPRESSOR  Take 0.5 tablets (12.5 mg total) by mouth 2 (two) times daily. What changed: how much to take   pantoprazole  40 MG tablet Commonly known as: Protonix  Take 1 tablet (40 mg total) by mouth daily.   rosuvastatin  5 MG tablet Commonly known as: CRESTOR  Take 1 tablet (5 mg total) by mouth at bedtime.   Xarelto  20 MG Tabs tablet Generic drug: rivaroxaban  TAKE 1 TABLET BY MOUTH DAILY WITH SUPPER.   zolpidem  5 MG tablet Commonly known as: AMBIEN  TAKE 1 TABLET BY MOUTH AT BEDTIME AS NEEDED FOR SLEEP.        Follow-up Information     Kristie Lamprey, MD. Call in 1 week(s).   Specialty: Gastroenterology Contact information: [address], [place] [address] [phone]         Odean Potts, MD. Call.   Specialty: Hematology and Oncology Contact information: [address] [place] [address] [phone]                Discharge Exam: Fredricka Weights   01/30/24 1857 01/31/24 0400 02/02/24 1327  Weight: 95.3 kg 89.5 kg 89.5 kg   General:  Appears calm and comfortable and is in NAD, laying in the bed comfortably Cardiovascular:  RRR, 3/6 systolic murmur. No LE edema.  Respiratory:   Clear to auscultation bilaterally Abdomen: Nontender no masses palpable Skin:  no rash or induration seen on limited exam Musculoskeletal:  grossly normal tone  Psychiatric:  grossly normal mood and affect,  Neurologic:  CN 2-12 grossly intact, moves all extremities in coordinated fashion, sensation intact  Condition at discharge: good  The results of significant diagnostics from this hospitalization (including imaging, microbiology, ancillary and laboratory) are listed below for reference.   Imaging Studies: DG Chest  2 View Result Date: 01/30/2024 CLINICAL DATA:  Chest pain EXAM: CHEST - 2 VIEW COMPARISON:  Chest x-ray 10/02/2022 FINDINGS: The heart size and mediastinal contours are within normal limits. Both lungs are clear. The visualized skeletal structures are unremarkable. IMPRESSION: No active cardiopulmonary disease. Electronically Signed   By: Greig Pique M.D.   On: 01/30/2024 20:06    Microbiology: Results for orders placed or performed during the hospital encounter of 01/30/24  Surgical PCR screen     Status: None   Collection Time: 02/01/24 10:01 PM   Specimen: Nasal Mucosa; Nasal Swab  Result Value Ref Range Status   MRSA, PCR NEGATIVE NEGATIVE Final   Staphylococcus aureus NEGATIVE NEGATIVE Final    Comment: (NOTE) The Xpert SA Assay (FDA approved for NASAL specimens in patients 75 years of age and older), is one component of a comprehensive surveillance program. It is not intended to diagnose infection nor to guide or monitor treatment. Performed at [facility], [address] [place], [place] [address]     Labs: CBC: Recent Labs  Lab 01/30/24 1859 01/31/24 1600 02/01/24 0910 02/02/24 1044 02/03/24 0542  WBC 3.8* 2.8* 2.5* 2.7* 2.9*  NEUTROABS  --   --  1.9  --  2.0  HGB 5.4* 7.1* 7.4* 7.8* 7.6*  HCT 19.3* 24.4* 26.3* 27.3* 26.8*  MCV 73.4* 79.5* 79.7* 79.6* 81.2  PLT 86* 65* 64* 71* 69*   Basic Metabolic Panel: Recent Labs  Lab 01/30/24 1859 01/31/24 1600 02/03/24 0542  NA 140 137 138  K 3.9 3.8 3.5  CL 108 108 107  CO2 21* 20* 24  GLUCOSE 132* 99 88  BUN 19 16 13   CREATININE 1.12 0.90 1.02  CALCIUM  8.3* 8.1* 7.9*   Liver Function Tests: Recent Labs  Lab 01/30/24 2046 01/31/24 1600  AST 24 13*  ALT 7 10  ALKPHOS 73 63  BILITOT 0.5 2.1*  PROT 5.6* 5.5*  ALBUMIN 3.7 3.1*   CBG: No results for input(s): GLUCAP in the last 168 hours.  Discharge time spent:  39 minutes.  Signed: Drue ONEIDA Potter, MD [facility] 02/03/2024

## 2024-02-04 NOTE — Anesthesia Postprocedure Evaluation (Signed)
 Anesthesia Post Note  Patient: Anup [facility]  Procedure(s) Performed: EGD (ESOPHAGOGASTRODUODENOSCOPY)     Patient location during evaluation: Endoscopy Anesthesia Type: MAC Level of consciousness: awake and alert Pain management: pain level controlled Vital Signs Assessment: post-procedure vital signs reviewed and stable Respiratory status: spontaneous breathing, nonlabored ventilation, respiratory function stable and patient connected to nasal cannula oxygen Cardiovascular status: stable and blood pressure returned to baseline Postop Assessment: no apparent nausea or vomiting Anesthetic complications: no   No notable events documented.  Last Vitals:  Vitals:   02/02/24 2133 02/03/24 0700  BP: 105/68 102/64  Pulse: 91 88  Resp: 18 18  Temp: 36.8 C 36.7 C  SpO2: 98% 97%    Last Pain:  Vitals:   02/03/24 1036  TempSrc:   PainSc: 0-No pain                 Shamara Soza S

## 2024-02-06 ENCOUNTER — Ambulatory Visit

## 2024-02-06 ENCOUNTER — Encounter (HOSPITAL_COMMUNITY): Payer: Self-pay | Admitting: Gastroenterology

## 2024-02-06 VITALS — BP 111/64 | HR 65 | Temp 97.5°F | Resp 16 | Ht 71.0 in | Wt 190.8 lb

## 2024-02-06 DIAGNOSIS — D509 Iron deficiency anemia, unspecified: Secondary | ICD-10-CM

## 2024-02-06 MED ORDER — SODIUM CHLORIDE 0.9 % IV SOLN
510.0000 mg | Freq: Once | INTRAVENOUS | Status: AC
Start: 2024-02-06 — End: 2024-02-06
  Administered 2024-02-06: 510 mg via INTRAVENOUS
  Filled 2024-02-06: qty 17

## 2024-02-06 NOTE — Progress Notes (Signed)
 Attempted to obtain medical history for pre op call via telephone, unable to reach at this time. HIPAA compliant voicemail message left requesting return call to pre surgical testing department.

## 2024-02-06 NOTE — Progress Notes (Signed)
 Diagnosis: Acute Anemia  Provider:  Lonna Coder MD  Procedure: IV Infusion  IV Type: Peripheral, IV Location: L Antecubital  Feraheme (Ferumoxytol), Dose: 510 mg  Infusion Start Time: 0914  Infusion Stop Time: 0933  Post Infusion IV Care: Observation period completed and Peripheral IV Discontinued  Discharge: Condition: Good, Destination: Home . AVS Declined  Performed by:  Donny Childes, RN

## 2024-02-09 ENCOUNTER — Other Ambulatory Visit: Payer: Self-pay

## 2024-02-09 DIAGNOSIS — I8500 Esophageal varices without bleeding: Secondary | ICD-10-CM | POA: Diagnosis not present

## 2024-02-09 DIAGNOSIS — K7460 Unspecified cirrhosis of liver: Secondary | ICD-10-CM | POA: Diagnosis not present

## 2024-02-09 DIAGNOSIS — D696 Thrombocytopenia, unspecified: Secondary | ICD-10-CM

## 2024-02-10 ENCOUNTER — Inpatient Hospital Stay: Attending: Hematology and Oncology

## 2024-02-10 ENCOUNTER — Inpatient Hospital Stay (HOSPITAL_BASED_OUTPATIENT_CLINIC_OR_DEPARTMENT_OTHER): Admitting: Hematology and Oncology

## 2024-02-10 VITALS — BP 119/62 | HR 63 | Temp 97.6°F | Resp 17 | Ht 71.0 in | Wt 191.7 lb

## 2024-02-10 DIAGNOSIS — D649 Anemia, unspecified: Secondary | ICD-10-CM

## 2024-02-10 DIAGNOSIS — D696 Thrombocytopenia, unspecified: Secondary | ICD-10-CM

## 2024-02-10 DIAGNOSIS — D61818 Other pancytopenia: Secondary | ICD-10-CM | POA: Diagnosis present

## 2024-02-10 DIAGNOSIS — D509 Iron deficiency anemia, unspecified: Secondary | ICD-10-CM | POA: Insufficient documentation

## 2024-02-10 LAB — IRON AND IRON BINDING CAPACITY (CC-WL,HP ONLY)
Iron: 82 ug/dL (ref 45–182)
Saturation Ratios: 20 % (ref 17.9–39.5)
TIBC: 409 ug/dL (ref 250–450)
UIBC: 327 ug/dL (ref 117–376)

## 2024-02-10 LAB — CBC WITH DIFFERENTIAL (CANCER CENTER ONLY)
Abs Immature Granulocytes: 0.01 K/uL (ref 0.00–0.07)
Basophils Absolute: 0 K/uL (ref 0.0–0.1)
Basophils Relative: 1 %
Eosinophils Absolute: 0.1 K/uL (ref 0.0–0.5)
Eosinophils Relative: 4 %
HCT: 33.2 % — ABNORMAL LOW (ref 39.0–52.0)
Hemoglobin: 9.4 g/dL — ABNORMAL LOW (ref 13.0–17.0)
Immature Granulocytes: 0 %
Lymphocytes Relative: 14 %
Lymphs Abs: 0.5 K/uL — ABNORMAL LOW (ref 0.7–4.0)
MCH: 23.3 pg — ABNORMAL LOW (ref 26.0–34.0)
MCHC: 28.3 g/dL — ABNORMAL LOW (ref 30.0–36.0)
MCV: 82.2 fL (ref 80.0–100.0)
Monocytes Absolute: 0.3 K/uL (ref 0.1–1.0)
Monocytes Relative: 7 %
Neutro Abs: 2.6 K/uL (ref 1.7–7.7)
Neutrophils Relative %: 74 %
Platelet Count: 106 K/uL — ABNORMAL LOW (ref 150–400)
RBC: 4.04 MIL/uL — ABNORMAL LOW (ref 4.22–5.81)
RDW: 22.8 % — ABNORMAL HIGH (ref 11.5–15.5)
WBC Count: 3.5 K/uL — ABNORMAL LOW (ref 4.0–10.5)
nRBC: 0 % (ref 0.0–0.2)

## 2024-02-10 LAB — CMP (CANCER CENTER ONLY)
ALT: 9 U/L (ref 0–44)
AST: 15 U/L (ref 15–41)
Albumin: 3.9 g/dL (ref 3.5–5.0)
Alkaline Phosphatase: 77 U/L (ref 38–126)
Anion gap: 4 — ABNORMAL LOW (ref 5–15)
BUN: 13 mg/dL (ref 8–23)
CO2: 29 mmol/L (ref 22–32)
Calcium: 8.7 mg/dL — ABNORMAL LOW (ref 8.9–10.3)
Chloride: 109 mmol/L (ref 98–111)
Creatinine: 1.04 mg/dL (ref 0.61–1.24)
GFR, Estimated: >60 mL/min (ref >60)
Glucose, Bld: 128 mg/dL — ABNORMAL HIGH (ref 70–99)
Potassium: 3.9 mmol/L (ref 3.5–5.1)
Sodium: 142 mmol/L (ref 135–145)
Total Bilirubin: 0.7 mg/dL (ref 0.0–1.2)
Total Protein: 6.5 g/dL (ref 6.5–8.1)

## 2024-02-10 LAB — SAMPLE TO BLOOD BANK

## 2024-02-10 LAB — RETICULOCYTES
Immature Retic Fract: 32.3 % — ABNORMAL HIGH (ref 2.3–15.9)
RBC.: 4.1 MIL/uL — ABNORMAL LOW (ref 4.22–5.81)
Retic Count, Absolute: 141 K/uL (ref 19.0–186.0)
Retic Ct Pct: 3.4 % — ABNORMAL HIGH (ref 0.4–3.1)

## 2024-02-10 LAB — FERRITIN: Ferritin: 135 ng/mL (ref 24–336)

## 2024-02-10 NOTE — Assessment & Plan Note (Signed)
 chronic microcytic anemia, iron  deficiency and vitamin B12 anemia with GI bleed in 09/2022 related to portal hypertensive gastropathy and esophageal varices EGD on 10/03/22 with large varices s/p clipping and multiple gastric polyps and stigmata of recent bleeding that were removed (also with a 3 mm polyp in the rectum and sigmoid diverticulosis on colonoscopy); pathology of both stomach and rectal polyps was c/w hyperplastic polyps Hospitalization: 01/30/24- 02/03/24: Upper GI Bleeding IV Iron : 01/31/24  Thrombocytopenia:

## 2024-02-10 NOTE — Progress Notes (Signed)
 Patient Care Team: Breeback, Jade L, PA-C as PCP - General (Family Medicine) Baldwin Ubaldo Motto, NP as Nurse Practitioner (Urology) Pietro Redell RAMAN, MD as Attending Physician (Cardiology)  DIAGNOSIS:  Encounter Diagnosis  Name Primary?   Acute on chronic anemia Yes      CHIEF COMPLIANT: Follow-up of pancytopenia from recent hospitalization  HISTORY OF PRESENT ILLNESS:   History of Present Illness Joel Ford is a 73 year old male who presents for follow-up after iron  infusion therapy.  He experiences improved energy levels following iron  infusion therapy. There is no gastrointestinal bleeding. Blood counts have significantly improved since hospitalization. White blood cell count increased from 2.5 to 3.5, hemoglobin rose from the fives to almost nine, and platelets improved to 106. Overall his energy levels have improved dramatically.  He has not noticed any blood in the stool.    ALLERGIES:  has no known allergies.  MEDICATIONS:  Current Outpatient Medications  Medication Sig Dispense Refill   allopurinol  (ZYLOPRIM ) 300 MG tablet Take 300 mg by mouth daily.     cyanocobalamin  (VITAMIN B12) 1000 MCG tablet Take 1 tablet (1,000 mcg total) by mouth daily.     docusate sodium  (COLACE) 100 MG capsule Take 1 capsule (100 mg total) by mouth 2 (two) times daily. 10 capsule 0   levothyroxine  (SYNTHROID ) 100 MCG tablet Take 1 tablet (100 mcg total) by mouth daily. 90 tablet 1   linaclotide  (LINZESS ) 290 MCG CAPS capsule Take 1 capsule (290 mcg total) by mouth daily. 90 capsule 3   metoprolol  tartrate (LOPRESSOR ) 25 MG tablet Take 0.5 tablets (12.5 mg total) by mouth 2 (two) times daily. 180 tablet 0   pantoprazole  (PROTONIX ) 40 MG tablet Take 1 tablet (40 mg total) by mouth daily. 30 tablet 3   rosuvastatin  (CRESTOR ) 5 MG tablet Take 1 tablet (5 mg total) by mouth at bedtime. 100 tablet 3   XARELTO  20 MG TABS tablet TAKE 1 TABLET BY MOUTH DAILY WITH SUPPER. 90 tablet 1    zolpidem  (AMBIEN ) 5 MG tablet TAKE 1 TABLET BY MOUTH AT BEDTIME AS NEEDED FOR SLEEP. 90 tablet 1   No current facility-administered medications for this visit.    PHYSICAL EXAMINATION: ECOG PERFORMANCE STATUS: 1 - Symptomatic but completely ambulatory  Vitals:   02/10/24 1150  BP: 119/62  Pulse: 63  Resp: 17  Temp: 97.6 F (36.4 C)  SpO2: 100%   Filed Weights   02/10/24 1150  Weight: 191 lb 11.2 oz (87 kg)      LABORATORY DATA:  I have reviewed the data as listed    Latest Ref Rng & Units 02/10/2024   11:33 AM 02/03/2024    5:42 AM 01/31/2024    4:00 PM  CMP  Glucose 70 - 99 mg/dL 871  88  99   BUN 8 - 23 mg/dL 13  13  16    Creatinine 0.61 - 1.24 mg/dL 8.95  8.97  9.09   Sodium 135 - 145 mmol/L 142  138  137   Potassium 3.5 - 5.1 mmol/L 3.9  3.5  3.8   Chloride 98 - 111 mmol/L 109  107  108   CO2 22 - 32 mmol/L 29  24  20    Calcium  8.9 - 10.3 mg/dL 8.7  7.9  8.1   Total Protein 6.5 - 8.1 g/dL 6.5   5.5   Total Bilirubin 0.0 - 1.2 mg/dL 0.7   2.1   Alkaline Phos 38 - 126 U/L 77  63   AST 15 - 41 U/L 15   13   ALT 0 - 44 U/L 9   10     Lab Results  Component Value Date   WBC 3.5 (L) 02/10/2024   HGB 9.4 (L) 02/10/2024   HCT 33.2 (L) 02/10/2024   MCV 82.2 02/10/2024   PLT 106 (L) 02/10/2024   NEUTROABS 2.6 02/10/2024    ASSESSMENT & PLAN:  Acute on chronic anemia chronic microcytic anemia, iron  deficiency and vitamin B12 anemia with GI bleed in 09/2022 related to portal hypertensive gastropathy and esophageal varices EGD on 10/03/22 with large varices s/p clipping and multiple gastric polyps and stigmata of recent bleeding that were removed (also with a 3 mm polyp in the rectum and sigmoid diverticulosis on colonoscopy); pathology of both stomach and rectal polyps was c/w hyperplastic polyps Hospitalization: 01/30/24- 02/03/24: Upper GI Bleeding IV Iron : 01/31/24 (next IV iron  scheduled for 02/16/2024)  Thrombocytopenia: Today's platelet is 106 Pancytopenia:  Unclear etiology. Patient has varices but no evidence of liver disease which is surprising. Return to clinic in 3 months with labs and telephone visit 2 days later. ------------------------------------- Assessment and Plan Assessment & Plan Iron  deficiency anemia Iron  deficiency anemia with chronic anemia. Recent iron  infusion improved energy levels. Liver and kidney functions normal. CBC shows significant improvement in anemia. White blood cell count and platelet levels improving. - Complete scheduled iron  infusion next Monday. - Discontinue oral iron  supplements to avoid gastrointestinal irritation. - Recheck blood levels in three months. - Call with results the day after blood draw to discuss further management.      Orders Placed This Encounter  Procedures   CBC with Differential ([facility]cer Center Only)    Standing Status:   Future    Expiration Date:   02/09/2025   Ferritin    Standing Status:   Future    Expiration Date:   02/09/2025   Iron  and Iron  Binding Capacity ([facility] only)    Standing Status:   Future    Expiration Date:   02/09/2025   Vitamin B12    Standing Status:   Future    Expiration Date:   02/09/2025   CMP (Cancer Center only)    Standing Status:   Future    Expiration Date:   02/09/2025   The patient has a good understanding of the overall plan. he agrees with it. he will call with any problems that may develop before the next visit here. Total time spent: 30 mins including face to face time and time spent for planning, charting and co-ordination of care   Naomi MARLA Chad, MD 02/10/24

## 2024-02-13 ENCOUNTER — Ambulatory Visit (HOSPITAL_BASED_OUTPATIENT_CLINIC_OR_DEPARTMENT_OTHER): Admitting: Anesthesiology

## 2024-02-13 ENCOUNTER — Encounter (HOSPITAL_COMMUNITY): Payer: Self-pay | Admitting: Gastroenterology

## 2024-02-13 ENCOUNTER — Other Ambulatory Visit: Payer: Self-pay

## 2024-02-13 ENCOUNTER — Encounter (HOSPITAL_COMMUNITY)
Admission: RE | Disposition: A | Discharge status: Home / Self Care | Payer: Self-pay | Source: Home / Self Care | Attending: Gastroenterology

## 2024-02-13 ENCOUNTER — Ambulatory Visit (HOSPITAL_COMMUNITY): Admitting: Anesthesiology

## 2024-02-13 ENCOUNTER — Ambulatory Visit (HOSPITAL_COMMUNITY)
Admission: RE | Admit: 2024-02-13 | Discharge: 2024-02-13 | Disposition: A | Discharge status: Home / Self Care | Attending: Gastroenterology | Admitting: Gastroenterology

## 2024-02-13 DIAGNOSIS — E039 Hypothyroidism, unspecified: Secondary | ICD-10-CM

## 2024-02-13 DIAGNOSIS — I8500 Esophageal varices without bleeding: Secondary | ICD-10-CM | POA: Diagnosis not present

## 2024-02-13 DIAGNOSIS — D131 Benign neoplasm of stomach: Secondary | ICD-10-CM | POA: Diagnosis not present

## 2024-02-13 DIAGNOSIS — D509 Iron deficiency anemia, unspecified: Secondary | ICD-10-CM | POA: Diagnosis present

## 2024-02-13 DIAGNOSIS — I4891 Unspecified atrial fibrillation: Secondary | ICD-10-CM | POA: Insufficient documentation

## 2024-02-13 DIAGNOSIS — E785 Hyperlipidemia, unspecified: Secondary | ICD-10-CM | POA: Diagnosis not present

## 2024-02-13 DIAGNOSIS — I10 Essential (primary) hypertension: Secondary | ICD-10-CM | POA: Insufficient documentation

## 2024-02-13 DIAGNOSIS — Z7901 Long term (current) use of anticoagulants: Secondary | ICD-10-CM | POA: Diagnosis not present

## 2024-02-13 DIAGNOSIS — I8510 Secondary esophageal varices without bleeding: Secondary | ICD-10-CM | POA: Diagnosis not present

## 2024-02-13 DIAGNOSIS — K766 Portal hypertension: Secondary | ICD-10-CM | POA: Diagnosis not present

## 2024-02-13 DIAGNOSIS — K317 Polyp of stomach and duodenum: Secondary | ICD-10-CM | POA: Diagnosis not present

## 2024-02-13 DIAGNOSIS — I864 Gastric varices: Secondary | ICD-10-CM | POA: Diagnosis not present

## 2024-02-13 HISTORY — PX: ESOPHAGOGASTRODUODENOSCOPY: SHX5428

## 2024-02-13 HISTORY — PX: GASTRIC VARICES BANDING: SHX5519

## 2024-02-13 SURGERY — EGD (ESOPHAGOGASTRODUODENOSCOPY)
Anesthesia: Monitor Anesthesia Care

## 2024-02-13 MED ORDER — ONDANSETRON HCL 4 MG/2ML IJ SOLN
INTRAMUSCULAR | Status: DC | PRN
  Administered 2024-02-13: 4 mg via INTRAVENOUS

## 2024-02-13 MED ORDER — LIDOCAINE HCL (CARDIAC) PF 100 MG/5ML IV SOSY
PREFILLED_SYRINGE | INTRAVENOUS | Status: DC | PRN
  Administered 2024-02-13: 100 mg via INTRAVENOUS

## 2024-02-13 MED ORDER — PROPOFOL 500 MG/50ML IV EMUL
INTRAVENOUS | Status: DC | PRN
  Administered 2024-02-13: 125 ug/kg/min via INTRAVENOUS

## 2024-02-13 MED ORDER — PROPOFOL 1000 MG/100ML IV EMUL
INTRAVENOUS | Status: AC
  Filled 2024-02-13: qty 100

## 2024-02-13 MED ORDER — SODIUM CHLORIDE 0.9 % IV SOLN: INTRAVENOUS | Status: DC

## 2024-02-13 MED ORDER — PROPOFOL 10 MG/ML IV BOLUS
INTRAVENOUS | Status: DC | PRN
  Administered 2024-02-13 (×2): 50 mg via INTRAVENOUS

## 2024-02-13 MED ORDER — PROPOFOL 10 MG/ML IV BOLUS
INTRAVENOUS | Status: AC
  Filled 2024-02-13: qty 20

## 2024-02-13 NOTE — Op Note (Signed)
 [facility] Patient Name: Joel Ford Procedure Date: 02/13/2024 MRN: [id number] Attending MD: Belvie Just , MD, [id number] Date of Birth: [birth year] CSN: [id number] Age: 73 Admit Type: Ambulatory Procedure:                Upper GI endoscopy Indications:              For therapy of esophageal varices Providers:                Belvie Just, MD, Ozell Pouch, Randall Lines, RN Referring MD:              Medicines:                Propofol  per Anesthesia Complications:            No immediate complications. Estimated Blood Loss:     Estimated blood loss: none. Procedure:                Pre-Anesthesia Assessment:                           - Prior to the procedure, a History and Physical                            was performed, and patient medications and                            allergies were reviewed. The patient's tolerance of                            previous anesthesia was also reviewed. The risks                            and benefits of the procedure and the sedation                            options and risks were discussed with the patient.                            All questions were answered, and informed consent                            was obtained. Prior Anticoagulants: The patient has                            taken no anticoagulant or antiplatelet agents. ASA                            Grade Assessment: III - A patient with severe                            systemic disease. After reviewing the risks and                            benefits, the patient was deemed in satisfactory  condition to undergo the procedure.                           - Sedation was administered by an anesthesia                            professional. Deep sedation was attained.                           After obtaining informed consent, the endoscope was                            passed under direct vision. Throughout the                             procedure, the patient's blood pressure, pulse, and                            oxygen saturations were monitored continuously. The                            [id number]-[id number] ([id number]) Olympus endoscope was                            introduced through the mouth, and advanced to the                            second part of duodenum. The upper GI endoscopy was                            accomplished without difficulty. The patient                            tolerated the procedure well. Scope In: Scope Out: Findings:      Large (> 5 mm) varices were found in the middle third of the esophagus       and in the lower third of the esophagus. They were large in size. Seven       bands were successfully placed with incomplete eradication of varices.       There was no bleeding during the procedure.      Type 2 gastroesophageal varices (GOV2, esophageal varices which extend       along the fundus) with no bleeding were found in the cardia. There were       no stigmata of recent bleeding. They were medium in largest diameter.      Multiple 5 mm sessile polyps with no bleeding and no stigmata of recent       bleeding were found in the gastric body.      The examined duodenum was normal.      In the fundus of the stomach there was evidence of a large GOV2. No       evidence of any bleeding or suspicious signs of bleeding from this site.       A couple of the esophageal varices did show some small red wale signs.       Banding was successful in the  esophagus. The prior hyperplastic polyps       were again noted in the gastric lumen. Impression:               - Large (> 5 mm) esophageal varices. Incompletely                            eradicated. Banded.                           - Type 2 gastroesophageal varices (GOV2, esophageal                            varices which extend along the fundus), without                            bleeding.                           - Normal examined duodenum.                            - No specimens collected. Moderate Sedation:      Not Applicable - Patient had care per Anesthesia. Recommendation:           - Patient has a contact number available for                            emergencies. The signs and symptoms of potential                            delayed complications were discussed with the                            patient. Return to normal activities tomorrow.                            Written discharge instructions were provided to the                            patient.                           - Resume regular diet.                           - Continue present medications.                           - His case will be discussed with IR for                            consideration of BRTO versus repeat endoscopic                            variceal banding. Procedure Code(s):        --- Professional ---  43244, Esophagogastroduodenoscopy, flexible,                            transoral; with band ligation of esophageal/gastric                            varices Diagnosis Code(s):        --- Professional ---                           I85.00, Esophageal varices without bleeding                           I86.4, Gastric varices CPT copyright 2022 American Medical Association. All rights reserved. The codes documented in this report are preliminary and upon coder review may  be revised to meet current compliance requirements. Belvie Just, MD Belvie Just, MD 02/13/2024 12:35:09 PM This report has been signed electronically. Number of Addenda: 0

## 2024-02-13 NOTE — Discharge Instructions (Signed)

## 2024-02-13 NOTE — Transfer of Care (Signed)
 Immediate Anesthesia Transfer of Care Note  Patient: Joel Ford [facility]  Procedure(s) Performed: EGD (ESOPHAGOGASTRODUODENOSCOPY) BAND LIGATION, GASTRIC VARICES  Patient Location: PACU  Anesthesia Type:MAC  Level of Consciousness: drowsy  Airway & Oxygen Therapy: Patient Spontanous Breathing  Post-op Assessment: Report given to RN and Post -op Vital signs reviewed and stable  Post vital signs: Reviewed and stable  Last Vitals:  Vitals Value Taken Time  BP 158/84 02/13/24 12:29  Temp    Pulse 80 02/13/24 12:29  Resp 21 02/13/24 12:29  SpO2 97% on RA  02/13/24 12:29    Last Pain:  Vitals:   02/13/24 1003  TempSrc: Temporal  PainSc: 0-No pain      Patients Stated Pain Goal: 0 (02/13/24 1003)  Complications: No notable events documented.

## 2024-02-13 NOTE — Anesthesia Postprocedure Evaluation (Signed)
 Anesthesia Post Note  Patient: Alp Goldwater [facility]  Procedure(s) Performed: EGD (ESOPHAGOGASTRODUODENOSCOPY) BAND LIGATION, GASTRIC VARICES     Patient location during evaluation: PACU Anesthesia Type: MAC Level of consciousness: awake and alert Pain management: pain level controlled Vital Signs Assessment: post-procedure vital signs reviewed and stable Respiratory status: spontaneous breathing, nonlabored ventilation, respiratory function stable and patient connected to nasal cannula oxygen Cardiovascular status: stable and blood pressure returned to baseline Postop Assessment: no apparent nausea or vomiting Anesthetic complications: no   No notable events documented.  Last Vitals:  Vitals:   02/13/24 1240 02/13/24 1250  BP: (!) 150/84 138/78  Pulse: 69 65  Resp: 16 16  Temp:    SpO2: 98% 98%    Last Pain:  Vitals:   02/13/24 1250  TempSrc:   PainSc: 0-No pain                 Thom JONELLE Peoples

## 2024-02-13 NOTE — H&P (Signed)
 Jama Gaither Litter HPI: The patient is here for esophageal variceal banding for cryptogenic cirrhosis.  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  Past Medical History:  Diagnosis Date   ABNORMAL ELECTROCARDIOGRAM 07/04/2009   Qualifier: Diagnosis of  By: Baird FNP, [name]    Atrial fibrillation [facility])    Esophageal varices (HCC)    Gastric polyps    Gout    HYPERGLYCEMIA 07/27/2009   Qualifier: Diagnosis of  By: Bartley MD, Lamar Mulch    HYPERLIPIDEMIA 07/04/2009   Qualifier: Diagnosis of  By: Baird FNP, [name]    Left knee DJD    OA   Portal hypertensive gastropathy (HCC)    RENAL CALCULUS 02/15/2008   Qualifier: Diagnosis of  By: Baird FNP, [name]    S/P total knee replacement 08/07/2009   right   Thyroid  disease     Past Surgical History:  Procedure Laterality Date   APPENDECTOMY     age 79   CARDIAC CATHETERIZATION  01/23/2005   Normal   COLONOSCOPY WITH PROPOFOL  N/A 10/03/2022   Procedure: COLONOSCOPY WITH PROPOFOL ;  Surgeon: Rollin Dover, MD;  Location: [facility];  Service: Gastroenterology;  Laterality: N/A;   ESOPHAGOGASTRODUODENOSCOPY N/A 02/02/2024   Procedure: EGD (ESOPHAGOGASTRODUODENOSCOPY);  Surgeon: Kristie Lamprey, MD;  Location: [facility];  Service: Gastroenterology;  Laterality: N/A;  ANEMIA   ESOPHAGOGASTRODUODENOSCOPY (EGD) WITH PROPOFOL  N/A 10/03/2022   Procedure: ESOPHAGOGASTRODUODENOSCOPY (EGD) WITH PROPOFOL ;  Surgeon: Rollin Dover, MD;  Location: [facility];  Service: Gastroenterology;  Laterality: N/A;   FINGER SURGERY     RIGHT INDEX, REATTACHED   HEMOSTASIS CLIP PLACEMENT  10/03/2022   Procedure: HEMOSTASIS CLIP PLACEMENT;  Surgeon: Rollin Dover, MD;  Location: [facility];  Service: Gastroenterology;;   orthoscopy     Right and left knee   POLYPECTOMY  10/03/2022   Procedure: POLYPECTOMY;  Surgeon: Rollin Dover, MD;  Location: [facility];  Service: Gastroenterology;;   TOTAL KNEE ARTHROPLASTY  2011    Right   TOTAL KNEE ARTHROPLASTY Left 09/21/2012   Dr [name]   TOTAL KNEE ARTHROPLASTY Left 09/21/2012   Procedure: TOTAL KNEE ARTHROPLASTY;  Surgeon: [name], MD;  Location: [facility] OR;  Service: Orthopedics;  Laterality: Left;    Family History  Problem Relation Age of Onset   Stroke Father    Healthy Mother    Healthy Brother    Healthy Son    Colon cancer Neg Hx    Prostate cancer Neg Hx     Social History:  reports that he has never smoked. His smokeless tobacco use includes chew. He reports that he does not drink alcohol and does not use drugs.  Allergies: No Known Allergies  Medications: Scheduled: Continuous:  sodium chloride  20 mL/hr at 02/13/24 1005    No results found for this or any previous visit (from the past 24 hours).   No results found.  ROS:  As stated above in the HPI otherwise negative.  Blood pressure 137/69, temperature 97.6 F (36.4 C), temperature source Temporal, resp. rate 12, height 5' 10 (1.778 m), weight 86.2 kg, SpO2 100%.    PE: Gen: NAD, Alert and Oriented HEENT:  [place]/AT, EOMI Neck: Supple, no LAD Lungs: CTA Bilaterally CV: RRR without M/G/R ABD: Soft, NTND, +BS Ext: No C/C/E  Assessment/Plan: 1) Esophageal varices. - EGD with banding. 2) Cirrhosis.  Donyel Castagnola D 02/13/2024, 12:03 PM

## 2024-02-13 NOTE — Anesthesia Preprocedure Evaluation (Addendum)
 Anesthesia Evaluation  Patient identified by MRN, date of birth, ID band Patient awake    Reviewed: Allergy & Precautions, NPO status , Patient's Chart, lab work & pertinent test results, reviewed documented beta blocker date and time   Airway Mallampati: III  TM Distance: >3 FB Neck ROM: Full    Dental  (+) Dental Advisory Given, Poor Dentition, Missing   Pulmonary neg pulmonary ROS   Pulmonary exam normal breath sounds clear to auscultation       Cardiovascular hypertension, Pt. on home beta blockers and Pt. on medications Normal cardiovascular exam+ dysrhythmias Atrial Fibrillation + Valvular Problems/Murmurs (mild AS, mild AI) AS and AI  Rhythm:Regular Rate:Normal  TTE 2024 1. Calcified aortic valve with fixed right coronary cusp; mild AS with  mean gradient 13 mmHg and mild AI; apical hypertrophy with small area of  apical akinesis; suggest cardiac MRI to R/O apical HCM.   2. Left ventricular ejection fraction, by estimation, is 70 to 75%. The  left ventricle has hyperdynamic function. The left ventricle has no  regional wall motion abnormalities. There is moderate left ventricular  hypertrophy. Left ventricular diastolic  parameters are consistent with Grade I diastolic dysfunction (impaired  relaxation). Elevated left atrial pressure.   3. Right ventricular systolic function is normal. The right ventricular  size is normal. There is normal pulmonary artery systolic pressure.   4. Left atrial size was moderately dilated.   5. The mitral valve is normal in structure. Mild mitral valve  regurgitation. No evidence of mitral stenosis.   6. The aortic valve is normal in structure. Aortic valve regurgitation is  mild. Mild aortic valve stenosis.   7. The inferior vena cava is normal in size with greater than 50%  respiratory variability, suggesting right atrial pressure of 3 mmHg.     Neuro/Psych negative neurological ROS   negative psych ROS   GI/Hepatic negative GI ROS,,,Portal HTN with esophageal varices   Endo/Other  Hypothyroidism    Renal/GU negative Renal ROS  negative genitourinary   Musculoskeletal  (+) Arthritis ,    Abdominal   Peds  Hematology  (+) Blood dyscrasia (xarelto ), anemia Lab Results      Component                Value               Date                      WBC                      2.7 (L)             02/02/2024                HGB                      7.8 (L)             02/02/2024                HCT                      27.3 (L)            02/02/2024                MCV  79.6 (L)            02/02/2024                PLT                      71 (L)              02/02/2024              Anesthesia Other Findings 72yo with h/o afib on Xarelto , chronic thrombocytopenia, portal HTN with esophageal varices, HTN, and HLD who presented on 7/25 with SOB and CP.  Hgb 5.1, heme positive, GI consulted  Reproductive/Obstetrics                              Anesthesia Physical Anesthesia Plan  ASA: 3  Anesthesia Plan: MAC   Post-op Pain Management:    Induction: Intravenous  PONV Risk Score and Plan: 1 and Propofol  infusion and Treatment may vary due to age or medical condition  Airway Management Planned: Natural Airway  Additional Equipment:   Intra-op Plan:   Post-operative Plan:   Informed Consent: I have reviewed the patients History and Physical, chart, labs and discussed the procedure including the risks, benefits and alternatives for the proposed anesthesia with the patient or authorized representative who has indicated his/her understanding and acceptance.     Dental advisory given  Plan Discussed with: CRNA  Anesthesia Plan Comments:          Anesthesia Quick Evaluation

## 2024-02-16 ENCOUNTER — Encounter (HOSPITAL_COMMUNITY): Payer: Self-pay | Admitting: Gastroenterology

## 2024-02-16 ENCOUNTER — Ambulatory Visit

## 2024-02-16 VITALS — BP 121/65 | HR 56 | Temp 98.0°F | Resp 14 | Ht 70.0 in | Wt 185.8 lb

## 2024-02-16 DIAGNOSIS — D509 Iron deficiency anemia, unspecified: Secondary | ICD-10-CM | POA: Diagnosis not present

## 2024-02-16 MED ORDER — SODIUM CHLORIDE 0.9 % IV SOLN
510.0000 mg | Freq: Once | INTRAVENOUS | Status: AC
Start: 2024-02-16 — End: 2024-02-16
  Administered 2024-02-16 (×2): 510 mg via INTRAVENOUS
  Filled 2024-02-16: qty 17

## 2024-02-16 NOTE — Progress Notes (Signed)
 Diagnosis: Iron  Deficiency Anemia  Provider:  Praveen Mannam MD  Procedure: IV Infusion  IV Type: Peripheral, IV Location: L Antecubital  Feraheme (Ferumoxytol ), Dose: 510 mg  Infusion Start Time: 0844  Infusion Stop Time: 0900 Post Infusion IV Care: Observation period completed and Peripheral IV Discontinued  Discharge: Condition: Good, Destination: Home . AVS Declined  Performed by:  Shamar Kracke, RN

## 2024-03-01 ENCOUNTER — Other Ambulatory Visit: Payer: Self-pay | Admitting: *Deleted

## 2024-03-01 MED ORDER — DOCUSATE SODIUM 100 MG PO CAPS: 100.0000 mg | ORAL_CAPSULE | Freq: Two times a day (BID) | ORAL | 1 refills | Status: DC

## 2024-03-02 ENCOUNTER — Other Ambulatory Visit: Payer: Self-pay | Admitting: Gastroenterology

## 2024-03-02 DIAGNOSIS — I864 Gastric varices: Secondary | ICD-10-CM

## 2024-03-11 ENCOUNTER — Other Ambulatory Visit: Payer: Self-pay | Admitting: Physician Assistant

## 2024-03-11 DIAGNOSIS — I480 Paroxysmal atrial fibrillation: Secondary | ICD-10-CM

## 2024-03-17 ENCOUNTER — Ambulatory Visit
Admission: RE | Admit: 2024-03-17 | Discharge: 2024-03-17 | Disposition: A | Discharge status: Home / Self Care | Source: Ambulatory Visit | Attending: Gastroenterology | Admitting: Gastroenterology

## 2024-03-17 ENCOUNTER — Other Ambulatory Visit: Payer: Self-pay | Admitting: Diagnostic Radiology

## 2024-03-17 DIAGNOSIS — I864 Gastric varices: Secondary | ICD-10-CM

## 2024-03-17 DIAGNOSIS — I8500 Esophageal varices without bleeding: Secondary | ICD-10-CM | POA: Diagnosis not present

## 2024-03-17 DIAGNOSIS — D649 Anemia, unspecified: Secondary | ICD-10-CM | POA: Diagnosis not present

## 2024-03-17 HISTORY — PX: IR RADIOLOGIST EVAL & MGMT: IMG5224

## 2024-03-17 NOTE — Consult Note (Signed)
 Chief Complaint: Patient was seen in consultation today for esophageal varices  at the request of Hung,Patrick  Referring Physician(s): Hung,Patrick  History of Present Illness: Joel Ford is a 73 y.o. male with history of chronic microcytic anemia, iron  deficiency, vitamin B12 anemia, thrombocytopenia and history of guaiac positive stools.    EGD on 10/03/22 demonstrated large varices in the middle and lower third of the esophagus with polyps with bleeding and stigmata of recently bleeding in the gastric fundus and gastric body. Polyps were resected and clips were placed.  Stomach and rectal polyps were hyperplastic polyps.  EGD from 01/30/24 demonstrated: Four columns of grade II varices with no stigmata of recent bleeding were found in the entire esophagus, 40 cm from the incisors. They were 5 mm in largest diameter. No red wale signs were present. Multiple sessile polyps with no bleeding and no stigmata of recent bleeding were found in the stomach.   EGD from 02/13/24: Large ( > 5 mm) esophageal varices. Incompletely eradicated. Banded. Type 2 gastroesophageal varices  Patient denies hematemesis, hematochezia or melena. Patient is asymptomatic.  No abdominal distention.    Past Medical History:  Diagnosis Date   ABNORMAL ELECTROCARDIOGRAM 07/04/2009   Qualifier: Diagnosis of  By: Baird FNP, [name]    Atrial fibrillation [facility])    Esophageal varices (HCC)    Gastric polyps    Gout    HYPERGLYCEMIA 07/27/2009   Qualifier: Diagnosis of  By: Bartley MD, Lamar Mulch    HYPERLIPIDEMIA 07/04/2009   Qualifier: Diagnosis of  By: Baird FNP, [name]    Left knee DJD    OA   Portal hypertensive gastropathy (HCC)    RENAL CALCULUS 02/15/2008   Qualifier: Diagnosis of  By: Baird FNP, [name]    S/P total knee replacement 08/07/2009   right   Thyroid  disease     Past Surgical History:  Procedure Laterality Date   APPENDECTOMY     age 72    CARDIAC CATHETERIZATION  01/23/2005   Normal   COLONOSCOPY WITH PROPOFOL  N/A 10/03/2022   Procedure: COLONOSCOPY WITH PROPOFOL ;  Surgeon: Rollin Dover, MD;  [facility]: [facility];  Service: Gastroenterology;  Laterality: N/A;   ESOPHAGOGASTRODUODENOSCOPY N/A 02/02/2024   Procedure: EGD (ESOPHAGOGASTRODUODENOSCOPY);  Surgeon: Kristie Lamprey, MD;  Location: [facility];  Service: Gastroenterology;  Laterality: N/A;  ANEMIA   ESOPHAGOGASTRODUODENOSCOPY N/A 02/13/2024   Procedure: EGD (ESOPHAGOGASTRODUODENOSCOPY);  Surgeon: Rollin Dover, MD;  Location: [facility];  Service: Gastroenterology;  Laterality: N/A;   ESOPHAGOGASTRODUODENOSCOPY (EGD) WITH PROPOFOL  N/A 10/03/2022   Procedure: ESOPHAGOGASTRODUODENOSCOPY (EGD) WITH PROPOFOL ;  Surgeon: Rollin Dover, MD;  Location: [facility];  Service: Gastroenterology;  Laterality: N/A;   FINGER SURGERY     RIGHT INDEX, REATTACHED   GASTRIC VARICES BANDING N/A 02/13/2024   Procedure: BAND LIGATION, GASTRIC VARICES;  Surgeon: Rollin Dover, MD;  Location: [facility];  Service: Gastroenterology;  Laterality: N/A;   HEMOSTASIS CLIP PLACEMENT  10/03/2022   Procedure: HEMOSTASIS CLIP PLACEMENT;  Surgeon: Rollin Dover, MD;  [facility];  Service: Gastroenterology;;   orthoscopy     Right and left knee   POLYPECTOMY  10/03/2022   Procedure: POLYPECTOMY;  Surgeon: Rollin Dover, MD;  Location[facility];  Service: Gastroenterology;;   TOTAL KNEE ARTHROPLASTY  2011   Right   TOTAL KNEE ARTHROPLASTY Left 09/21/2012   Dr [name]   TOTAL KNEE ARTHROPLASTY Left 09/21/2012   Procedure: TOTAL KNEE ARTHROPLASTY;  Surgeon: [name], MD;  Location[facility]  OR;  Service: Orthopedics;  Laterality: Left;    Allergies: Patient has no known allergies.  Medications: Prior to Admission medications   Medication Sig Start Date End Date Taking? Authorizing Provider  allopurinol  (ZYLOPRIM ) 300 MG tablet Take 300 mg by mouth daily. 01/27/19   [provider]  cyanocobalamin  (VITAMIN B12) 1000 MCG tablet Take 1 tablet (1,000 mcg total) by mouth daily. 10/04/22   Jadine Toribio SQUIBB, MD  docusate sodium  (COLACE) 100 MG capsule Take 1 capsule (100 mg total) by mouth 2 (two) times daily. 03/01/24   Gudena, Vinay, MD  levothyroxine  (SYNTHROID ) 100 MCG tablet Take 1 tablet (100 mcg total) by mouth daily. 11/13/23   Breeback, Jade L, PA-C  linaclotide  (LINZESS ) 290 MCG CAPS capsule Take 1 capsule (290 mcg total) by mouth daily. 11/21/23   Breeback, Jade L, PA-C  metoprolol  tartrate (LOPRESSOR ) 25 MG tablet TAKE 1 TABLET BY MOUTH TWICE A DAY 03/11/24   Breeback, Jade L, PA-C  pantoprazole  (PROTONIX ) 40 MG tablet Take 1 tablet (40 mg total) by mouth daily. 02/03/24 02/02/25  Dorinda Drue DASEN, MD  rosuvastatin  (CRESTOR ) 5 MG tablet Take 1 tablet (5 mg total) by mouth at bedtime. 05/14/23   Alvan Dorothyann BIRCH, MD  XARELTO  20 MG TABS tablet TAKE 1 TABLET BY MOUTH DAILY WITH SUPPER. 10/14/23   Darron Deatrice LABOR, MD  zolpidem  (AMBIEN ) 5 MG tablet TAKE 1 TABLET BY MOUTH AT BEDTIME AS NEEDED FOR SLEEP. 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     [employer].     Married 1977   Social Drivers of Research scientist (physical sciences) Strain: Not on file  Food Insecurity: No Food Insecurity (01/31/2024)   Hunger Vital Sign    Worried About Running Out of Food in the Last Year: Never true    Ran Out of Food in the Last Year: Never true  Transportation Needs: No Transportation Needs (01/31/2024)   PRAPARE - Administrator, Civil Service (Medical): No    Lack of Transportation (Non-Medical): No  Physical Activity: Not on file  Stress: Not on file  Social Connections: Socially Integrated (01/31/2024)   Social Connection and Isolation Panel    Frequency of Communication with Friends and Family: Three times a week    Frequency of Social Gatherings with Friends and Family: Three times a week    Attends Religious Services: 1 to 4 times per year    Active Member of Clubs or Organizations: No    Attends Banker Meetings: 1  to 4 times per year    Marital Status: Married     Review of Systems  Constitutional: Negative.   Respiratory: Negative.    Cardiovascular: Negative.   Gastrointestinal: Negative.   Genitourinary: Negative.     Vital Signs: BP 114/63 (BP Location: Left Arm, Patient Position: Sitting, Cuff Size: Normal)   Pulse 66   Temp (!) 97.5 F (36.4 C) (Oral)   Resp 20   SpO2 98%     Physical Exam Constitutional:      Appearance: He is not ill-appearing.  Cardiovascular:     Rate and Rhythm: Normal rate.  Pulmonary:     Effort: Pulmonary effort is normal.  Abdominal:     General: There is no distension.     Tenderness: There is no abdominal tenderness.  Musculoskeletal:        General: No swelling.  Neurological:     Mental Status: He is alert.        Imaging: CLINICAL DATA:  Constipation, recent upper GI bleed with anemia. Abdominal pain.   EXAM: CT ABDOMEN AND PELVIS WITHOUT CONTRAST   TECHNIQUE: Multidetector CT imaging of the abdomen and pelvis was performed following the standard protocol without IV contrast.   RADIATION DOSE REDUCTION: This exam  was performed according to the departmental dose-optimization program which includes automated exposure control, adjustment of the mA and/or kV according to patient size and/or use of iterative reconstruction technique.   COMPARISON:  05/28/2021.   FINDINGS: Lower chest: No acute findings. Atherosclerotic calcification of the aorta and aortic valve. Heart size normal. Decreased attenuation of the intravascular compartment is compatible with the provided history of anemia. No pericardial or pleural effusion. Distal esophagus is grossly unremarkable.   Hepatobiliary: Liver and gallbladder are unremarkable. No biliary ductal dilatation.   Pancreas: Negative.   Spleen: Slightly enlarged, 14.0 cm.   Adrenals/Urinary Tract: Adrenal glands are unremarkable. Small stones in the right kidney. 1.8 cm hyperdense lesion in the interpolar left kidney (2/37), previously slightly larger and low in attenuation, measuring 2.3 cm on 05/28/2021, likely a hyperdense or proteinaceous cyst. No specific follow-up necessary. Ureters are decompressed. Bladder is grossly unremarkable.   Stomach/Bowel: Stomach, small bowel and colon are unremarkable. Rectocele. Appendectomy.   Vascular/Lymphatic: Atherosclerotic calcification of the aorta. No pathologically enlarged lymph nodes.   Reproductive: Prostate is mildly enlarged.   Other: Small bilateral inguinal hernias contain fat. No free fluid. Mesenteries and peritoneum are unremarkable.   Musculoskeletal: Degenerative changes in the spine.   IMPRESSION: 1. No findings to explain the patient's clinical history. 2. Small right renal stones. 3. Mild splenomegaly. 4. Rectocele. 5. Mild prostate enlargement. 6.  Aortic atherosclerosis (ICD10-I70.0).     Electronically Signed   By: Newell Eke M.D.   On: 10/30/2022 08:25    Labs:  CBC: Recent Labs    02/01/24 0910 02/02/24 1044 02/03/24 0542 02/10/24 1133  WBC 2.5* 2.7* 2.9* 3.5*   HGB 7.4* 7.8* 7.6* 9.4*  HCT 26.3* 27.3* 26.8* 33.2*  PLT 64* 71* 69* 106*    COAGS: Recent Labs    10/24/23 2300  APTT 43*    BMP: Recent Labs    01/30/24 1859 01/31/24 1600 02/03/24 0542 02/10/24 1133  NA 140 137 138 142  K 3.9 3.8 3.5 3.9  CL 108 108 107 109  CO2 21* 20* 24 29  GLUCOSE 132* 99 88 128*  BUN 19 16 13 13   CALCIUM  8.3* 8.1* 7.9* 8.7*  CREATININE 1.12 0.90 1.02 1.04  GFRNONAA >60 >60 >60 >60    LIVER FUNCTION TESTS: Recent Labs    10/24/23 2300 01/30/24 2046 01/31/24 1600 02/10/24 1133  BILITOT 0.6 0.5 2.1* 0.7  AST 20 24 13* 15  ALT 11 7 10 9   ALKPHOS 82 73 63 77  PROT 6.8 5.6* 5.5* 6.5  ALBUMIN 3.7 3.7 3.1* 3.9    TUMOR MARKERS: No results for input(s): AFPTM, CEA, CA199, CHROMGRNA in the last 8760 hours.  Assessment and Plan:  73 yo yo with anemia, thrombocytopenia and history of GI bleed.  Found to have hyperplastic gastric polyps and esophageal varices s/p banding.  Patient was referred to IR to evaluate for additional treatment of the esophageal varices.  The esophageal varices are thought to be secondary to portal hypertension and cryptogenic cirrhosis.  I reviewed a CT from 10/28/22 and the liver looks normal although the spleen is mildly prominent. No clear evidence for large esophageal varices on the 2024 CT either.  Not clear why the patient has esophageal varices based on imaging.  Liver function is also normal.  We need to further evaluate the patient's venous anatomy and understand this presumed portal hypertension.   Discussed esophageal varices treatments including TIPS and BRTO.  Based the location of the varices, patient is probably not a candidate for BRTO.  Will order a CTA of the abdomen and pelvis (BRTO protocol) to evaluate the vascular anatomy and look for other cases of portal hypertension besides cirrhosis.  In addition, patient may need a transjugular liver biopsy with pressures but would like to see the vascular  anatomy before getting the biopsy.   Thank you for this interesting consult.  I greatly enjoyed meeting [employer] and look forward to participating in their care.  A copy of this report was sent to the requesting provider on this date.  Electronically Signed: Juliene JONELLE Balder 03/17/2024, 12:48 PM   I spent a total of  30 Minutes   in face to face in clinical consultation, greater than 50% of which was counseling/coordinating care for esophageal varices.

## 2024-03-22 ENCOUNTER — Other Ambulatory Visit: Payer: Self-pay | Admitting: Cardiovascular Disease

## 2024-03-22 ENCOUNTER — Other Ambulatory Visit: Payer: Self-pay | Admitting: Physician Assistant

## 2024-03-22 DIAGNOSIS — Z7901 Long term (current) use of anticoagulants: Secondary | ICD-10-CM

## 2024-03-22 DIAGNOSIS — I480 Paroxysmal atrial fibrillation: Secondary | ICD-10-CM

## 2024-03-22 NOTE — Telephone Encounter (Signed)
 Xarelto  20mg  refill request received. Pt is 73 years old, weight-84.3kg, Crea-1.04 on 02/10/24, last seen by Orren Fabry on 03/24/23 via Tele Visit for pre-op and Dr. Pietro on 11/04/22-overdue, Diagnosis-Afib, CrCl- 75.43 mL/min; Dose is appropriate based on dosing criteria.   Will send message to Schedulers and send in limited supply.

## 2024-04-05 ENCOUNTER — Ambulatory Visit
Admission: RE | Admit: 2024-04-05 | Discharge: 2024-04-05 | Disposition: A | Discharge status: Home / Self Care | Source: Ambulatory Visit | Attending: Diagnostic Radiology | Admitting: Diagnostic Radiology

## 2024-04-05 ENCOUNTER — Encounter: Payer: Self-pay | Admitting: Radiology

## 2024-04-05 DIAGNOSIS — I864 Gastric varices: Secondary | ICD-10-CM | POA: Diagnosis not present

## 2024-04-05 MED ORDER — IOPAMIDOL (ISOVUE-370) INJECTION 76%
100.0000 mL | Freq: Once | INTRAVENOUS | Status: AC | PRN
  Administered 2024-04-05: 100 mL via INTRAVENOUS

## 2024-04-12 ENCOUNTER — Emergency Department (HOSPITAL_BASED_OUTPATIENT_CLINIC_OR_DEPARTMENT_OTHER)
Admission: EM | Admit: 2024-04-12 | Discharge: 2024-04-12 | Disposition: A | Discharge status: Home / Self Care | Attending: Emergency Medicine | Admitting: Emergency Medicine

## 2024-04-12 ENCOUNTER — Encounter (HOSPITAL_BASED_OUTPATIENT_CLINIC_OR_DEPARTMENT_OTHER): Payer: Self-pay | Admitting: Urology

## 2024-04-12 ENCOUNTER — Other Ambulatory Visit: Payer: Self-pay

## 2024-04-12 DIAGNOSIS — M545 Low back pain, unspecified: Secondary | ICD-10-CM | POA: Insufficient documentation

## 2024-04-12 DIAGNOSIS — Z96652 Presence of left artificial knee joint: Secondary | ICD-10-CM | POA: Insufficient documentation

## 2024-04-12 DIAGNOSIS — Z7901 Long term (current) use of anticoagulants: Secondary | ICD-10-CM | POA: Diagnosis not present

## 2024-04-12 DIAGNOSIS — I4891 Unspecified atrial fibrillation: Secondary | ICD-10-CM | POA: Insufficient documentation

## 2024-04-12 MED ORDER — LIDOCAINE 4 % EX PTCH: 1.0000 | MEDICATED_PATCH | CUTANEOUS | 0 refills | Status: DC

## 2024-04-12 MED ORDER — PREDNISONE 10 MG (21) PO TBPK: ORAL_TABLET | Freq: Every day | ORAL | 0 refills | Status: DC

## 2024-04-12 MED ORDER — ACETAMINOPHEN 500 MG PO TABS
1000.0000 mg | ORAL_TABLET | Freq: Once | ORAL | Status: AC
  Administered 2024-04-12: 1000 mg via ORAL
  Filled 2024-04-12: qty 2

## 2024-04-12 MED ORDER — LIDOCAINE 5 % EX PTCH
1.0000 | MEDICATED_PATCH | CUTANEOUS | Status: DC
  Administered 2024-04-12: 1 via TRANSDERMAL
  Filled 2024-04-12: qty 1

## 2024-04-12 NOTE — ED Provider Notes (Signed)
 [place] EMERGENCY DEPARTMENT AT [facility] Provider Note   CSN: [id number] Arrival date & time: 04/12/24  1306     Patient presents with: No chief complaint on file.   Joel Ford is a 73 y.o. male with history of A-fib on Eliquis, esophageal varices presents with complaints of atraumatic right lower back pain x 1 week.  Denies any radicular symptoms.  No urinary symptoms.  Pain is worse with movement.  No prior spinal surgeries.  No urinary or fecal incontinence.  No history of malignancy.   HPI  Past Medical History:  Diagnosis Date   ABNORMAL ELECTROCARDIOGRAM 07/04/2009   Qualifier: Diagnosis of  By: Baird FNP, [name]    Atrial fibrillation [facility])    Esophageal varices (HCC)    Gastric polyps    Gout    HYPERGLYCEMIA 07/27/2009   Qualifier: Diagnosis of  By: Bartley MD, Lamar Mulch    HYPERLIPIDEMIA 07/04/2009   Qualifier: Diagnosis of  By: Baird FNP, [name]    Left knee DJD    OA   Portal hypertensive gastropathy (HCC)    RENAL CALCULUS 02/15/2008   Qualifier: Diagnosis of  By: Baird FNP, [name]    S/P total knee replacement 08/07/2009   right   Thyroid  disease    Past Surgical History:  Procedure Laterality Date   APPENDECTOMY     age 43   CARDIAC CATHETERIZATION  01/23/2005   Normal   COLONOSCOPY WITH PROPOFOL  N/A 10/03/2022   Procedure: COLONOSCOPY WITH PROPOFOL ;  Surgeon: Rollin Dover, MD;  Location: [facility];  Service: Gastroenterology;  Laterality: N/A;   ESOPHAGOGASTRODUODENOSCOPY N/A 02/02/2024   Procedure: EGD (ESOPHAGOGASTRODUODENOSCOPY);  Surgeon: Kristie Lamprey, MD;  Location: [facility];  Service: Gastroenterology;  Laterality: N/A;  ANEMIA   ESOPHAGOGASTRODUODENOSCOPY N/A 02/13/2024   Procedure: EGD (ESOPHAGOGASTRODUODENOSCOPY);  Surgeon: Rollin Dover, MD;  Location: [facility];  Service: Gastroenterology;  Laterality: N/A;   ESOPHAGOGASTRODUODENOSCOPY (EGD) WITH PROPOFOL  N/A 10/03/2022    Procedure: ESOPHAGOGASTRODUODENOSCOPY (EGD) WITH PROPOFOL ;  Surgeon: Rollin Dover, MD;  Location: [facility];  Service: Gastroenterology;  Laterality: N/A;   FINGER SURGERY     RIGHT INDEX, REATTACHED   GASTRIC VARICES BANDING N/A 02/13/2024   Procedure: BAND LIGATION, GASTRIC VARICES;  Surgeon: Rollin Dover, MD;  Location: [facility];  Service: Gastroenterology;  Laterality: N/A;   HEMOSTASIS CLIP PLACEMENT  10/03/2022   Procedure: HEMOSTASIS CLIP PLACEMENT;  Surgeon: Rollin Dover, MD;  Location: [facility];  Service: Gastroenterology;;   IR RADIOLOGIST EVAL & MGMT  03/17/2024   orthoscopy     Right and left knee   POLYPECTOMY  10/03/2022   Procedure: POLYPECTOMY;  Surgeon: Rollin Dover, MD;  Location: [facility];  Service: Gastroenterology;;   TOTAL KNEE ARTHROPLASTY  2011   Right   TOTAL KNEE ARTHROPLASTY Left 09/21/2012   Dr [name]   TOTAL KNEE ARTHROPLASTY Left 09/21/2012   Procedure: TOTAL KNEE ARTHROPLASTY;  Surgeon: [name], MD;  Location: [facility] OR;  Service: Orthopedics;  Laterality: Left;       Prior to Admission medications   Medication Sig Start Date End Date Taking? Authorizing Provider  lidocaine  (HM LIDOCAINE  PATCH) 4 % Place 1 patch onto the skin daily. 04/12/24  Yes Donnajean Lynwood DEL, PA-C  predniSONE  (STERAPRED UNI-PAK 21 TAB) 10 MG (21) TBPK tablet Take by mouth daily. Take 6 tabs by mouth daily  for 2 days, then 5 tabs for 2 days, then 4 tabs for 2 days, then 3 tabs for 2 days,  2 tabs for 2 days, then 1 tab by mouth daily for 2 days 04/12/24  Yes Donnajean Lynwood DEL, PA-C  allopurinol  (ZYLOPRIM ) 300 MG tablet Take 300 mg by mouth daily. 01/27/19   [provider]  cyanocobalamin  (VITAMIN B12) 1000 MCG tablet Take 1 tablet (1,000 mcg total) by mouth daily. 10/04/22   Jadine Toribio SQUIBB, MD  docusate sodium  (COLACE) 100 MG capsule Take 1 capsule (100 mg total) by mouth 2 (two) times daily. 03/01/24   Gudena, Vinay, MD  levothyroxine  (SYNTHROID ) 100 MCG  tablet Take 1 tablet (100 mcg total) by mouth daily. 11/13/23   Breeback, Jade L, PA-C  levothyroxine  (SYNTHROID ) 88 MCG tablet TAKE 1 TABLET BY MOUTH EVERY DAY 03/26/24   Breeback, Jade L, PA-C  linaclotide  (LINZESS ) 290 MCG CAPS capsule Take 1 capsule (290 mcg total) by mouth daily. 11/21/23   Breeback, Jade L, PA-C  metoprolol  tartrate (LOPRESSOR ) 25 MG tablet TAKE 1 TABLET BY MOUTH TWICE A DAY 03/11/24   Breeback, Jade L, PA-C  pantoprazole  (PROTONIX ) 40 MG tablet Take 1 tablet (40 mg total) by mouth daily. 02/03/24 02/02/25  Dorinda Drue DASEN, MD  rivaroxaban  (XARELTO ) 20 MG TABS tablet Take 1 tablet (20 mg total) by mouth daily with supper. Please call the office to schedule annual appointment. 03/22/24   Pietro Redell RAMAN, MD  rosuvastatin  (CRESTOR ) 5 MG tablet Take 1 tablet (5 mg total) by mouth at bedtime. 05/14/23   Alvan Dorothyann BIRCH, MD  zolpidem  (AMBIEN ) 5 MG tablet TAKE 1 TABLET BY MOUTH AT BEDTIME AS NEEDED FOR SLEEP. 12/03/23   Breeback, Jade L, PA-C    Allergies: Patient has no known allergies.    Review of Systems  Musculoskeletal:  Positive for myalgias.    Updated Vital Signs BP 123/73   Pulse 65   Temp 97.6 F (36.4 C)   Resp 18   Ht 5' 10 (1.778 m)   Wt 84.3 kg   SpO2 100%   BMI 26.67 kg/m   Physical Exam Vitals and nursing note reviewed.  Constitutional:      General: He is not in acute distress.    Appearance: He is well-developed.  HENT:     Head: Normocephalic and atraumatic.  Eyes:     Conjunctiva/sclera: Conjunctivae normal.  Cardiovascular:     Rate and Rhythm: Normal rate and regular rhythm.     Heart sounds: Murmur heard.  Pulmonary:     Effort: Pulmonary effort is normal. No respiratory distress.     Breath sounds: Normal breath sounds.  Abdominal:     Palpations: Abdomen is soft.     Tenderness: There is no abdominal tenderness.  Musculoskeletal:        General: No swelling.     Cervical back: Neck supple.     Comments: Tender right lower lumbar  paraspinal region, no midline tenderness, 5 out of 5 lower extremity strength, no sensation deficit, positive right straight leg raise.  No CVAT  Skin:    General: Skin is warm and dry.     Capillary Refill: Capillary refill takes less than 2 seconds.  Neurological:     Mental Status: He is alert.  Psychiatric:        Mood and Affect: Mood normal.     (all labs ordered are listed, but only abnormal results are displayed) Labs Reviewed - No data to display  EKG: None  Radiology: No results found.   Procedures   Medications Ordered in the ED  lidocaine  (  LIDODERM ) 5 % 1 patch (has no administration in time range)  acetaminophen  (TYLENOL ) tablet 1,000 mg (has no administration in time range)    Clinical Course as of 04/12/24 1402  Mon Apr 12, 2024  1357 Patient evaluated for atraumatic right lower back pain x 1 week.  He is hemodynamically stable.  He has no red flag symptoms.  On exam he has lumbar right paraspinal region tenderness, no midline tenderness, he has 5 out of 5 lower extremity strength.  No sensation deficits.  Does have a positive right straight leg raise.  He is ambulatory.  Do not feel that any urgent advanced imaging or neurosurgery consult is indicated today.  Patient be discharged home.  Will send in prescription for steroid taper and lidocaine  patches.  Patient is understanding agreement plan.  Will provide Ortho follow-up. [JT]    Clinical Course User Index [JT] Donnajean Lynwood DEL, PA-C                                 Medical Decision Making  This patient presents to the ED with chief complaint(s) of back pain.  The complaint involves an extensive differential diagnosis and also carries with it a high risk of complications and morbidity.   Pertinent past medical history as listed in HPI  The differential diagnosis includes  No urinary symptoms to suggest cystitis, prostatitis, pyelonephritis or nephrolithiasis.  Exam and history not consistent with spinal  abscess, cauda equina.  No trauma to be concerned about fracture.`  Additional history obtained: Records reviewed Care Everywhere/External Records  Disposition:   Patient will be discharged home. The patient has been appropriately medically screened and/or stabilized in the ED. I have low suspicion for any other emergent medical condition which would require further screening, evaluation or treatment in the ED or require inpatient management. At time of discharge the patient is hemodynamically stable and in no acute distress. I have discussed work-up results and diagnosis with patient and answered all questions. Patient is agreeable with discharge plan. We discussed strict return precautions for returning to the emergency department and they verbalized understanding.     Social Determinants of Health:   none  This note was dictated with voice recognition software.  Despite best efforts at proofreading, errors may have occurred which can change the documentation meaning.       Final diagnoses:  Acute right-sided low back pain without sciatica    ED Discharge Orders          Ordered    predniSONE  (STERAPRED UNI-PAK 21 TAB) 10 MG (21) TBPK tablet  Daily        04/12/24 1400    lidocaine  (HM LIDOCAINE  PATCH) 4 %  Every 24 hours        04/12/24 1400               Donnajean Lynwood DEL, PA-C 04/12/24 1402    Ruthe Cornet, DO 04/12/24 1409

## 2024-04-12 NOTE — Discharge Instructions (Signed)
 You were evaluated in the emergency room for low back pain.  Prescription for steroid pack and lidocaine  patches was sent into your pharmacy.  If you experience any new or worsening symptoms please return the emergency room.  Otherwise please call the number on the sheet to follow-up with orthopedics.

## 2024-04-12 NOTE — ED Triage Notes (Signed)
 Pt states lower back pain that started 1 week ago, gradually worsening No urinary complaints  Pain worse with movement and  bending  Pain with getting up and down from chair    H/o chronic back pain

## 2024-04-14 ENCOUNTER — Telehealth (HOSPITAL_COMMUNITY): Payer: Self-pay | Admitting: Radiology

## 2024-04-14 ENCOUNTER — Other Ambulatory Visit (HOSPITAL_COMMUNITY): Payer: Self-pay | Admitting: Diagnostic Radiology

## 2024-04-14 DIAGNOSIS — I864 Gastric varices: Secondary | ICD-10-CM

## 2024-04-14 NOTE — Telephone Encounter (Signed)
 Called pt to schedule IR transjugular bx with Dr. Philip on 10/13. Left VM for him to call back. JM

## 2024-04-27 ENCOUNTER — Telehealth (HOSPITAL_COMMUNITY): Payer: Self-pay | Admitting: Radiology

## 2024-04-27 NOTE — Telephone Encounter (Signed)
 Called pt to schedule transjugular liver biopsy at [facility]. Left VM for pt to call back. 2nd attempt to reach pt. Joel Ford

## 2024-05-06 ENCOUNTER — Ambulatory Visit: Payer: Self-pay

## 2024-05-06 ENCOUNTER — Encounter: Payer: Self-pay | Admitting: Family Medicine

## 2024-05-06 ENCOUNTER — Ambulatory Visit

## 2024-05-06 ENCOUNTER — Ambulatory Visit (INDEPENDENT_AMBULATORY_CARE_PROVIDER_SITE_OTHER): Admitting: Family Medicine

## 2024-05-06 VITALS — BP 121/62 | HR 79 | Ht 70.0 in | Wt 190.1 lb

## 2024-05-06 DIAGNOSIS — M25551 Pain in right hip: Secondary | ICD-10-CM

## 2024-05-06 MED ORDER — TRAMADOL HCL 50 MG PO TABS: 50.0000 mg | ORAL_TABLET | Freq: Two times a day (BID) | ORAL | 0 refills | Status: AC | PRN

## 2024-05-06 NOTE — Patient Instructions (Signed)
 Can take 2 tabs of Tylenol  Arthritis 3 x a day ( about 8 hours apart)  Ok to take Tramadol  with the Tylenol  up to twice a day

## 2024-05-06 NOTE — Progress Notes (Signed)
 Acute Office Visit  Patient ID: Joel Ford, male    DOB: 03/25/1951, 73 y.o.   MRN: [id number]  PCP: Antoniette Vermell CROME, PA-C  Chief Complaint  Patient presents with   thigh pain    R thigh    Subjective:     HPI  Discussed the use of AI scribe software for clinical note transcription with the patient, who gave verbal consent to proceed.  History of Present Illness Joel Ford is a 73 year old male who presents with acute hip and leg pain.  Acute hip and leg pain - Acute onset of pain in the hip and leg beginning two days ago after lunch - Pain originates in the hamstring and radiates to the quadriceps - Pain intensity was severe enough to prevent ambulation yesterday afternoon - Pain improved overnight, allowing ambulation this morning - No prior episodes of similar pain - No history of prior hip imaging  Associated symptoms and physical findings - No associated swelling, erythema, or increased warmth in the leg  Precipitating factors - Prolonged sitting in a hard chair with a stool while watching a ball game, which is atypical for him  Pain management and medication considerations - Tylenol  provided partial relief of pain - Heating pad use resulted in symptom improvement - Unable to use anti-inflammatory medications due to Xarelto  anticoagulation therapy   ROS     Objective:    BP 121/62   Pulse 79   Ht 5' 10 (1.778 m)   Wt 190 lb 1.3 oz (86.2 kg)   SpO2 100%   BMI 27.27 kg/m    Physical Exam Vitals reviewed.  Constitutional:      Appearance: Normal appearance.  HENT:     Head: Normocephalic.  Pulmonary:     Effort: Pulmonary effort is normal.  Musculoskeletal:     Comments: Right hip with normal flexion, extension.  Pain with rotation in ward and outward.  Patellar reflex 1+ on left and 0 on right.  (Hx of bilat knee replacement.  Nontender over the lumbar spine. Neg straight leg raise.    Neurological:     Mental Status: He is alert  and oriented to person, place, and time.  Psychiatric:        Mood and Affect: Mood normal.        Behavior: Behavior normal.       No results found for any visits on 05/06/24.     Assessment & Plan:   Problem List Items Addressed This Visit   None Visit Diagnoses       Right hip pain    -  Primary   Relevant Medications   traMADol  (ULTRAM ) 50 MG tablet   Other Relevant Orders   DG Hip Unilat W OR W/O Pelvis 2-3 Views Right       Assessment and Plan Assessment & Plan Right hip pain Acute right hip pain. Differential includes hip joint pathology and soft tissue issues. Blood clot less likely. - Order x-ray of right hip to assess joint pathology. - Prescribe tramadol  twice daily as needed for pain, in addition to acetaminophen . - Instruct to take two extra strength acetaminophen  three times daily. - Advise use of heat or ice for pain relief. - Recommend avoiding prolonged sitting and performing gentle stretches. - Instruct to monitor for swelling and report if it occurs. - Provided instructions on medication use and pain management.    Meds ordered this encounter  Medications   traMADol  (ULTRAM )  50 MG tablet    Sig: Take 1 tablet (50 mg total) by mouth every 12 (twelve) hours as needed for up to 5 days.    Dispense:  10 tablet    Refill:  0    Return if symptoms worsen or fail to improve.  Dorothyann Byars, MD [facility] at [facility]

## 2024-05-06 NOTE — Telephone Encounter (Signed)
 Appt today

## 2024-05-06 NOTE — Telephone Encounter (Signed)
 FYI Only or Action Required?: FYI only for provider: appointment scheduled on 05/06/24.  Patient was last seen in primary care on 11/21/2023 by Antoniette Vermell CROME, PA-C.  Called Nurse Triage reporting Leg Pain.  Symptoms began yesterday.  Interventions attempted: Rest, hydration, or home remedies.  Symptoms are: unchanged.  Triage Disposition: See HCP Within 4 Hours (Or PCP Triage)  Patient/caregiver understands and will follow disposition?: Yes   Copied from CRM #[id number]. Topic: Clinical - Red Word Triage >> May 06, 2024  8:58 AM Tobias CROME wrote: Red Word that prompted transfer to Nurse Triage: pain in upper thigh, yesterday could barely walk due to pain Reason for Disposition  [1] SEVERE pain (e.g., excruciating, unable to do any normal activities) AND [2] not improved after 2 hours of pain medicine  Answer Assessment - Initial Assessment Questions Additional info: Xerelto   1. ONSET: When did the pain start?      Tuesday  2. LOCATION: Where is the pain located?      Upper thigh 3. PAIN: How bad is the pain?    (Scale 1-10; or mild, moderate, severe)     severe 4. WORK OR EXERCISE: Has there been any recent work or exercise that involved this part of the body?      No, walking dog 5. CAUSE: What do you think is causing the leg pain?     Unsure-pulled muscle  6. OTHER SYMPTOMS: Do you have any other symptoms? (e.g., chest pain, back pain, breathing difficulty, swelling, rash, fever, numbness, weakness)      Denies  Protocols used: Leg Pain-A-AH

## 2024-05-06 NOTE — Progress Notes (Signed)
 Pt reports that he began experiencing groin/thigh pain on Tuesday. He said that the pain was wrapping around his thigh from the back to the front and it feels tight. He could hardly walk it was so painful. He took some Tylenol  for the pain which helped some.   He said that the pain did ease some yesterday.

## 2024-05-10 ENCOUNTER — Ambulatory Visit: Payer: Self-pay | Admitting: Family Medicine

## 2024-05-10 ENCOUNTER — Telehealth: Payer: Self-pay

## 2024-05-10 NOTE — Telephone Encounter (Signed)
 PAP: Patient assistance application for Linzess through [employer]) has been mailed to pt's home address on file. Provider portion of application will be faxed to provider's office.

## 2024-05-10 NOTE — Progress Notes (Signed)
 HI Keven, x-ray of your hip looks good.  No concerning abnormalities.  Actually have decent joint space.  So no severe arthritis.  I would like to consider formal physical therapy for your hip.  We have PT here in our building if that is convenient.

## 2024-05-12 ENCOUNTER — Inpatient Hospital Stay: Attending: Hematology and Oncology

## 2024-05-12 DIAGNOSIS — K922 Gastrointestinal hemorrhage, unspecified: Secondary | ICD-10-CM | POA: Insufficient documentation

## 2024-05-12 DIAGNOSIS — D500 Iron deficiency anemia secondary to blood loss (chronic): Secondary | ICD-10-CM | POA: Insufficient documentation

## 2024-05-14 NOTE — Telephone Encounter (Signed)
 Received provider portion PAP application for Linzess  [place])

## 2024-05-17 ENCOUNTER — Inpatient Hospital Stay: Admitting: Hematology and Oncology

## 2024-05-17 ENCOUNTER — Telehealth (HOSPITAL_COMMUNITY): Payer: Self-pay | Admitting: Radiology

## 2024-05-17 ENCOUNTER — Inpatient Hospital Stay

## 2024-05-17 DIAGNOSIS — D500 Iron deficiency anemia secondary to blood loss (chronic): Secondary | ICD-10-CM | POA: Diagnosis not present

## 2024-05-17 DIAGNOSIS — D696 Thrombocytopenia, unspecified: Secondary | ICD-10-CM | POA: Diagnosis not present

## 2024-05-17 DIAGNOSIS — D649 Anemia, unspecified: Secondary | ICD-10-CM

## 2024-05-17 DIAGNOSIS — K922 Gastrointestinal hemorrhage, unspecified: Secondary | ICD-10-CM | POA: Diagnosis not present

## 2024-05-17 LAB — VITAMIN B12: Vitamin B-12: 514 pg/mL (ref 180–914)

## 2024-05-17 LAB — CBC WITH DIFFERENTIAL (CANCER CENTER ONLY)
Abs Immature Granulocytes: 0.01 K/uL (ref 0.00–0.07)
Basophils Absolute: 0 K/uL (ref 0.0–0.1)
Basophils Relative: 1 %
Eosinophils Absolute: 0.1 K/uL (ref 0.0–0.5)
Eosinophils Relative: 2 %
HCT: 28.4 % — ABNORMAL LOW (ref 39.0–52.0)
Hemoglobin: 7.5 g/dL — ABNORMAL LOW (ref 13.0–17.0)
Immature Granulocytes: 0 %
Lymphocytes Relative: 11 %
Lymphs Abs: 0.4 K/uL — ABNORMAL LOW (ref 0.7–4.0)
MCH: 17.7 pg — ABNORMAL LOW (ref 26.0–34.0)
MCHC: 26.4 g/dL — ABNORMAL LOW (ref 30.0–36.0)
MCV: 67.1 fL — ABNORMAL LOW (ref 80.0–100.0)
Monocytes Absolute: 0.3 K/uL (ref 0.1–1.0)
Monocytes Relative: 9 %
Neutro Abs: 2.8 K/uL (ref 1.7–7.7)
Neutrophils Relative %: 77 %
Platelet Count: 115 K/uL — ABNORMAL LOW (ref 150–400)
RBC: 4.23 MIL/uL (ref 4.22–5.81)
RDW: 19.2 % — ABNORMAL HIGH (ref 11.5–15.5)
WBC Count: 3.6 K/uL — ABNORMAL LOW (ref 4.0–10.5)
nRBC: 0 % (ref 0.0–0.2)

## 2024-05-17 LAB — CMP (CANCER CENTER ONLY)
ALT: 5 U/L (ref 0–44)
AST: 10 U/L — ABNORMAL LOW (ref 15–41)
Albumin: 4.3 g/dL (ref 3.5–5.0)
Alkaline Phosphatase: 86 U/L (ref 38–126)
Anion gap: 7 (ref 5–15)
BUN: 18 mg/dL (ref 8–23)
CO2: 26 mmol/L (ref 22–32)
Calcium: 9.1 mg/dL (ref 8.9–10.3)
Chloride: 107 mmol/L (ref 98–111)
Creatinine: 1.01 mg/dL (ref 0.61–1.24)
GFR, Estimated: >60 mL/min (ref >60)
Glucose, Bld: 157 mg/dL — ABNORMAL HIGH (ref 70–99)
Potassium: 3.9 mmol/L (ref 3.5–5.1)
Sodium: 140 mmol/L (ref 135–145)
Total Bilirubin: 1 mg/dL (ref 0.0–1.2)
Total Protein: 6.9 g/dL (ref 6.5–8.1)

## 2024-05-17 LAB — FERRITIN: Ferritin: 9 ng/mL — ABNORMAL LOW (ref 24–336)

## 2024-05-17 LAB — IRON AND IRON BINDING CAPACITY (CC-WL,HP ONLY)
Iron: 16 ug/dL — ABNORMAL LOW (ref 45–182)
Saturation Ratios: 3 % — ABNORMAL LOW (ref 17.9–39.5)
TIBC: 489 ug/dL — ABNORMAL HIGH (ref 250–450)
UIBC: 473 ug/dL — ABNORMAL HIGH (ref 117–376)

## 2024-05-17 NOTE — Progress Notes (Signed)
 HEMATOLOGY-ONCOLOGY TELEPHONE VISIT PROGRESS NOTE  I connected with our patient on 05/17/24 at  8:15 AM EST by telephone and verified that I am speaking with the correct person using two identifiers.  I discussed the limitations, risks, security and privacy concerns of performing an evaluation and management service by telephone and the availability of in person appointments.  I also discussed with the patient that there may be a patient responsible charge related to this service. The patient expressed understanding and agreed to proceed.   History of Present Illness: Telephone follow-up of iron  deficiency anemia  History of Present Illness Joel Ford is a 73 year old male with anemia who presents with fatigue and low hemoglobin levels.  He experiences fatigue related to anemia. His hemoglobin level is currently 7.5, with very low iron  levels. No blood loss is observed. Three months ago, his hemoglobin was 7.6, which increased to 9.4 after iron  treatment but has since decreased to 7.5. He previously received three doses of iron , which improved his hemoglobin levels.    REVIEW OF SYSTEMS:   Constitutional: Denies fevers, chills or abnormal weight loss All other systems were reviewed with the patient and are negative. Observations/Objective:     Assessment Plan:  Normocytic anemia chronic microcytic anemia, iron  deficiency and vitamin B12 anemia with GI bleed in 09/2022 related to portal hypertensive gastropathy and esophageal varices EGD on 10/03/22 with large varices s/p clipping and multiple gastric polyps and stigmata of recent bleeding that were removed (also with a 3 mm polyp in the rectum and sigmoid diverticulosis on colonoscopy); pathology of both stomach and rectal polyps was c/w hyperplastic polyps Hospitalization: 01/30/24- 02/03/24: Upper GI Bleeding IV Iron : 01/31/24 (Feraheme)  Lab review: 05/17/2024: Hemoglobin 7.5, MCV 67.1, platelets 115, iron  saturation 3%, TIBC  489, B12 514, ferritin 9   Thrombocytopenia: Today's platelet is 115 Recommendation: IV iron  therapy Patient has varices but no evidence of liver disease which is surprising. Return to clinic in 3 months with labs and telephone visit 2 days later.   I discussed the assessment and treatment plan with the patient. The patient was provided an opportunity to ask questions and all were answered. The patient agreed with the plan and demonstrated an understanding of the instructions. The patient was advised to call back or seek an in-person evaluation if the symptoms worsen or if the condition fails to improve as anticipated.   I provided 20 minutes of non-face-to-face time during this encounter.  This includes time for charting and coordination of care   Naomi MARLA Chad, MD

## 2024-05-17 NOTE — Telephone Encounter (Signed)
 Third attempt made to schedule patient for transjugular liver bx. JM

## 2024-05-17 NOTE — Assessment & Plan Note (Signed)
 chronic microcytic anemia, iron  deficiency and vitamin B12 anemia with GI bleed in 09/2022 related to portal hypertensive gastropathy and esophageal varices EGD on 10/03/22 with large varices s/p clipping and multiple gastric polyps and stigmata of recent bleeding that were removed (also with a 3 mm polyp in the rectum and sigmoid diverticulosis on colonoscopy); pathology of both stomach and rectal polyps was c/w hyperplastic polyps Hospitalization: 01/30/24- 02/03/24: Upper GI Bleeding IV Iron : 01/31/24    Thrombocytopenia: Today's platelet is  Pancytopenia: Unclear etiology. Patient has varices but no evidence of liver disease which is surprising. Return to clinic in 3 months with labs and telephone visit 2 days later.

## 2024-05-18 ENCOUNTER — Telehealth: Payer: Self-pay | Admitting: *Deleted

## 2024-05-18 ENCOUNTER — Telehealth: Payer: Self-pay

## 2024-05-18 NOTE — Telephone Encounter (Signed)
 Dr. Gudena, patient will be scheduled as soon as possible.  Auth Submission: NO AUTH NEEDED Site of care: Site of care: [facility] Payer: [employer] Medication & CPT/J Code(s) submitted: Feraheme (ferumoxytol ) [id number] Diagnosis Code:  Route of submission (phone, fax, portal):  Phone # Fax # Auth type: Buy/Bill PB Units/visits requested: 510mg  x 2 doses Reference number:  Approval from: 05/18/24 to 07/07/24

## 2024-05-18 NOTE — Telephone Encounter (Signed)
 Received VM from pt wife requesting refill on medication for pt to take to help with hemorrhoids while receiving IV Iron . RN unable to locate this information in pt chart.  RN attempt 1 to return call, no answer. LVM to return call to the office.

## 2024-05-19 ENCOUNTER — Ambulatory Visit

## 2024-05-19 VITALS — BP 138/69 | HR 71 | Temp 97.8°F | Resp 18 | Ht 70.0 in | Wt 187.4 lb

## 2024-05-19 DIAGNOSIS — D509 Iron deficiency anemia, unspecified: Secondary | ICD-10-CM | POA: Diagnosis not present

## 2024-05-19 MED ORDER — SODIUM CHLORIDE 0.9 % IV SOLN
510.0000 mg | Freq: Once | INTRAVENOUS | Status: AC
  Administered 2024-05-19: 510 mg via INTRAVENOUS
  Filled 2024-05-19: qty 17

## 2024-05-19 MED ORDER — DIPHENHYDRAMINE HCL 25 MG PO CAPS
25.0000 mg | ORAL_CAPSULE | Freq: Once | ORAL | Status: AC
  Administered 2024-05-19: 25 mg via ORAL
  Filled 2024-05-19: qty 1

## 2024-05-19 MED ORDER — ACETAMINOPHEN 325 MG PO TABS
650.0000 mg | ORAL_TABLET | Freq: Once | ORAL | Status: AC
  Administered 2024-05-19: 650 mg via ORAL
  Filled 2024-05-19: qty 2

## 2024-05-19 NOTE — Progress Notes (Signed)
 Diagnosis: Iron  Deficiency Anemia  Provider:  Praveen Mannam MD  Procedure: IV Infusion  IV Type: Peripheral, IV Location: R Antecubital  Feraheme (Ferumoxytol ), Dose: 510 mg  Infusion Start Time: 1043  Infusion Stop Time: 1100  Post Infusion IV Care: Observation period completed  Discharge: Condition: Good, Destination: Home . AVS Declined  Performed by:  Eleanor DELENA Bloch, RN

## 2024-05-21 ENCOUNTER — Telehealth: Payer: Self-pay

## 2024-05-21 NOTE — Progress Notes (Signed)
   05/21/2024  Patient ID: Joel Ford, male   DOB: 1951/06/14, 73 y.o.   MRN: [id number]  Patient outreach to follow-up on PAP application for Linzess .  Provider portion has been received, but patient portion has not been.  Patient's wife, Joel Ford) states they placed that in the mail to send back to the medication assistance team this morning.  Making them aware that this should arrive in the next few business days.  Channing DELENA Mealing, PharmD, DPLA

## 2024-05-25 NOTE — Telephone Encounter (Signed)
 PAP: Application for LINZESS  has been submitted to [employer]), via fax

## 2024-05-26 ENCOUNTER — Ambulatory Visit

## 2024-05-26 VITALS — BP 126/65 | HR 74 | Temp 98.3°F | Resp 16 | Ht 70.0 in | Wt 192.0 lb

## 2024-05-26 DIAGNOSIS — D509 Iron deficiency anemia, unspecified: Secondary | ICD-10-CM | POA: Diagnosis not present

## 2024-05-26 MED ORDER — ACETAMINOPHEN 325 MG PO TABS: 650.0000 mg | ORAL_TABLET | Freq: Once | ORAL | Status: DC

## 2024-05-26 MED ORDER — SODIUM CHLORIDE 0.9 % IV SOLN
510.0000 mg | Freq: Once | INTRAVENOUS | Status: AC
  Administered 2024-05-26: 510 mg via INTRAVENOUS
  Filled 2024-05-26: qty 17

## 2024-05-26 MED ORDER — DIPHENHYDRAMINE HCL 25 MG PO CAPS: 25.0000 mg | ORAL_CAPSULE | Freq: Once | ORAL | Status: DC

## 2024-05-26 NOTE — Progress Notes (Signed)
 Diagnosis: Iron  Deficiency Anemia  Provider:  Praveen Mannam MD  Procedure: IV Infusion  IV Type: Peripheral, IV Location: R Antecubital  Feraheme (Ferumoxytol ), Dose: 510 mg  Infusion Start Time: 1020  Infusion Stop Time: 1036  Post Infusion IV Care: Patient declined observation and Peripheral IV Discontinued  Discharge: Condition: Good, Destination: Home . AVS Declined  Performed by:  Leita FORBES Miles, LPN

## 2024-05-27 NOTE — Progress Notes (Signed)
 HPI: Follow-up paroxysmal atrial fibrillation and aortic stenosis. Nuclear study February 2018 showed ejection fraction 45% with no ischemia. CT June 2022 showed aortic atherosclerosis but no abdominal aortic aneurysm.  Patient does have a history of paroxysmal atrial fibrillation and is treated with metoprolol  and Xarelto .  Patient previously discharged following admission for severe microcytic anemia; initial hemoglobin 5.6; ferritin 6.  EGD showed large esophageal varices; gastric polyps noted which were removed.  Colonoscopy revealed 1 rectal polyp which was snared; diverticulosis noted.  TSH noted to be 25.14.  Also with history of thrombocytopenia.  Echocardiogram May 2024 showed vigorous LV function, moderate left ventricular hypertrophy, grade 1 diastolic dysfunction, moderate LAE, mild mitral regurgitation, mild aortic stenosis with mean gradient 13 mmHg and mild aortic insufficiency.  Abdominal CT September 2025 showed splenomegaly and submucosal gastric varices.  Since last seen patient denies increased dyspnea, chest pain, palpitations or syncope.  No melena or hematochezia.  Current Outpatient Medications  Medication Sig Dispense Refill   allopurinol  (ZYLOPRIM ) 300 MG tablet Take 300 mg by mouth daily.     cyanocobalamin  (VITAMIN B12) 1000 MCG tablet Take 1 tablet (1,000 mcg total) by mouth daily.     levothyroxine  (SYNTHROID ) 100 MCG tablet Take 1 tablet (100 mcg total) by mouth daily. 90 tablet 1   linaclotide  (LINZESS ) 290 MCG CAPS capsule Take 1 capsule (290 mcg total) by mouth daily. 90 capsule 3   metoprolol  tartrate (LOPRESSOR ) 25 MG tablet TAKE 1 TABLET BY MOUTH TWICE A DAY 180 tablet 0   pantoprazole  (PROTONIX ) 40 MG tablet Take 1 tablet (40 mg total) by mouth daily. 30 tablet 3   rivaroxaban  (XARELTO ) 20 MG TABS tablet Take 1 tablet (20 mg total) by mouth daily with supper. Please call the office to schedule annual appointment. 90 tablet 0   rosuvastatin  (CRESTOR ) 5 MG  tablet Take 1 tablet (5 mg total) by mouth at bedtime. 100 tablet 3   zolpidem  (AMBIEN ) 5 MG tablet TAKE 1 TABLET BY MOUTH AT BEDTIME AS NEEDED FOR SLEEP. 90 tablet 1   No current facility-administered medications for this visit.     Past Medical History:  Diagnosis Date   ABNORMAL ELECTROCARDIOGRAM 07/04/2009   Qualifier: Diagnosis of  By: Baird FNP, [name]    Atrial fibrillation [facility])    Esophageal varices (HCC)    Gastric polyps    Gout    HYPERGLYCEMIA 07/27/2009   Qualifier: Diagnosis of  By: Bartley MD, Lamar Mulch    HYPERLIPIDEMIA 07/04/2009   Qualifier: Diagnosis of  By: Baird FNP, [name]    Left knee DJD    OA   Portal hypertensive gastropathy (HCC)    RENAL CALCULUS 02/15/2008   Qualifier: Diagnosis of  By: Baird FNP, [name]    S/P total knee replacement 08/07/2009   right   Thyroid  disease    Tick bite 11/19/2018    Past Surgical History:  Procedure Laterality Date   APPENDECTOMY     age 73   CARDIAC CATHETERIZATION  01/23/2005   Normal   COLONOSCOPY WITH PROPOFOL  N/A 10/03/2022   Procedure: COLONOSCOPY WITH PROPOFOL ;  Surgeon: Rollin Dover, MD;  Location: [facility];  Service: Gastroenterology;  Laterality: N/A;   ESOPHAGOGASTRODUODENOSCOPY N/A 02/02/2024   Procedure: EGD (ESOPHAGOGASTRODUODENOSCOPY);  Surgeon: Kristie Lamprey, MD;  Location: [facility];  Service: Gastroenterology;  Laterality: N/A;  ANEMIA   ESOPHAGOGASTRODUODENOSCOPY N/A 02/13/2024   Procedure: EGD (ESOPHAGOGASTRODUODENOSCOPY);  Surgeon: Rollin Dover, MD;  Location: [facility];  Service:  Gastroenterology;  Laterality: N/A;   ESOPHAGOGASTRODUODENOSCOPY (EGD) WITH PROPOFOL  N/A 10/03/2022   Procedure: ESOPHAGOGASTRODUODENOSCOPY (EGD) WITH PROPOFOL ;  Surgeon: Rollin Dover, MD;  Location: [facility];  Service: Gastroenterology;  Laterality: N/A;   FINGER SURGERY     RIGHT INDEX, REATTACHED   GASTRIC VARICES BANDING N/A 02/13/2024   Procedure: BAND  LIGATION, GASTRIC VARICES;  Surgeon: Rollin Dover, MD;  Location: [facility];  Service: Gastroenterology;  Laterality: N/A;   HEMOSTASIS CLIP PLACEMENT  10/03/2022   Procedure: HEMOSTASIS CLIP PLACEMENT;  Surgeon: Rollin Dover, MD;  Location: [facility];  Service: Gastroenterology;;   IR RADIOLOGIST EVAL & MGMT  03/17/2024   orthoscopy     Right and left knee   POLYPECTOMY  10/03/2022   Procedure: POLYPECTOMY;  Surgeon: Rollin Dover, MD;  Location: [facility];  Service: Gastroenterology;;   TOTAL KNEE ARTHROPLASTY  2011   Right   TOTAL KNEE ARTHROPLASTY Left 09/21/2012   Dr [name]   TOTAL KNEE ARTHROPLASTY Left 09/21/2012   Procedure: TOTAL KNEE ARTHROPLASTY;  Surgeon: [name], MD;  Location: [facility] OR;  Service: Orthopedics;  Laterality: Left;    Social History   Socioeconomic History   Marital status: Married    Spouse name: Not on file   Number of children: 1   Years of education: Not on file   Highest education level: Not on file  Occupational History   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     [employer].     Married 1977   Social Drivers of Corporate Investment Banker Strain: Not on file  Food Insecurity: No Food Insecurity (01/31/2024)   Hunger Vital Sign    Worried About Running Out of Food in the Last Year: Never true    Ran Out of Food in the Last Year: Never true  Transportation Needs: No Transportation Needs (01/31/2024)   PRAPARE - Administrator, Civil Service (Medical): No    Lack of Transportation (Non-Medical): No  Physical Activity: Not on file  Stress: Not on file  Social Connections: Socially Integrated (01/31/2024)    Social Connection and Isolation Panel    Frequency of Communication with Friends and Family: Three times a week    Frequency of Social Gatherings with Friends and Family: Three times a week    Attends Religious Services: 1 to 4 times per year    Active Member of Clubs or Organizations: No    Attends Banker Meetings: 1 to 4 times per year    Marital Status: Married  Catering Manager Violence: Not At Risk (01/31/2024)   Humiliation, Afraid, Rape, and Kick questionnaire    Fear of Current or Ex-Partner: No    Emotionally Abused: No    Physically Abused: No    Sexually Abused: No    Family History  Problem Relation Age of Onset   Stroke Father    Healthy Mother    Healthy Brother    Healthy Son    Colon cancer Neg Hx    Prostate cancer Neg Hx     ROS: no fevers or chills, productive cough, hemoptysis, dysphasia, odynophagia,  melena, hematochezia, dysuria, hematuria, rash, seizure activity, orthopnea, PND, pedal edema, claudication. Remaining systems are negative.  Physical Exam: Well-developed well-nourished in no acute distress.  Skin is warm and dry.  HEENT is normal.  Neck is supple.  Chest is clear to auscultation with normal expansion.  Cardiovascular exam is regular rate and rhythm.  2/6 systolic murmur left sternal border. Abdominal exam nontender or distended. No masses palpated. Extremities show no edema. neuro grossly intact   A/P  1 AS-mild on last echo; repeat study.  He understands he may require TAVR in the future.  2 paroxysmal atrial fibrillation-patient remains in sinus rhythm on exam.  Continue metoprolol  and Xarelto .  He has had problems with GI bleeding related to varices and I again discussed Watchman device with him today.  He would now be agreeable to consider.  Will arrange evaluation.  [facility] Referral for Left Atrial Appendage Closure with Non-Valvular Atrial Fibrillation   Kaleab Frasier is a 73 y.o. male is being  referred to the [facility] Team for evaluation for Left Atrial Appendage Closure with Watchman device for the management of stroke risk resulting form non-valvular atrial fibrillation.    Base upon Mr. Wilhelmi's history, he is felt to be a poor candidate for long-term anticoagulation because of a history of bleeding (e.g. intracerebral, subdural, GI, retro-peritoneal).  The patient has a HAS-BLED score of 3 indicating a Yearly Major Bleeding Risk of 3.74%.  His CHADS2-VASc Score is 2 with an unadjusted Ischemic Stroke Rate (% per year) of 2.2%.   His stroke risk necessitates a strategy of stroke prevention with either long-term oral anticoagulation or left atrial appendage occlusion therapy. We have discussed their bleeding risk in the context of their comorbid medical problems, as well as the rationale for referral for evaluation of Watchman left atrial appendage occlusion therapy. While the patient is at high long-term bleeding risk, they may be appropriate for short-term anticoagulation. Based on this individual patient's stroke and bleeding risk, a shared decision has been made to refer the patient for consideration of Watchman left atrial appendage closure utilizing the [employer] of Cardiology shared decision tool.   3 hyperlipidemia-per primary care.  Redell Shallow, MD

## 2024-06-10 ENCOUNTER — Ambulatory Visit: Attending: Cardiology | Admitting: Cardiology

## 2024-06-10 ENCOUNTER — Telehealth: Payer: Self-pay

## 2024-06-10 ENCOUNTER — Encounter: Payer: Self-pay | Admitting: Cardiology

## 2024-06-10 VITALS — BP 124/70 | HR 68 | Ht 70.0 in | Wt 190.0 lb

## 2024-06-10 DIAGNOSIS — I480 Paroxysmal atrial fibrillation: Secondary | ICD-10-CM | POA: Diagnosis not present

## 2024-06-10 DIAGNOSIS — I350 Nonrheumatic aortic (valve) stenosis: Secondary | ICD-10-CM | POA: Diagnosis not present

## 2024-06-10 NOTE — Patient Instructions (Signed)
  Testing/Procedures:  Your physician has requested that you have an echocardiogram. Echocardiography is a painless test that uses sound waves to create images of your heart. It provides your doctor with information about the size and shape of your heart and how well your heart's chambers and valves are working. This procedure takes approximately one hour. There are no restrictions for this procedure. Please do NOT wear cologne, perfume, aftershave, or lotions (deodorant is allowed). Please arrive 15 minutes prior to your appointment time.  Please note: We ask at that you not bring children with you during ultrasound (echo/ vascular) testing. Due to room size and safety concerns, children are not allowed in the ultrasound rooms during exams. Our front office staff cannot provide observation of children in our lobby area while testing is being conducted. An adult accompanying a patient to their appointment will only be allowed in the ultrasound room at the discretion of the ultrasound technician under special circumstances. We apologize for any inconvenience. [place] OFFICE  Follow-Up: At [facility], you and your health needs are our priority.  As part of our continuing mission to provide you with exceptional heart care, our providers are all part of one team.  This team includes your primary Cardiologist (physician) and Advanced Practice Providers or APPs (Physician Assistants and Nurse Practitioners) who all work together to provide you with the care you need, when you need it.  Your next appointment:   6 month(s)  Provider:   REDELL SHALLOW MD

## 2024-06-10 NOTE — Telephone Encounter (Signed)
 Received Watchman referral from Dr. Pietro.  Spoke with patient and arranged Watchman consult with Dr. Almetta for 07/28/2024.

## 2024-06-19 ENCOUNTER — Other Ambulatory Visit: Payer: Self-pay | Admitting: Family Medicine

## 2024-07-06 ENCOUNTER — Other Ambulatory Visit: Payer: Self-pay | Admitting: Cardiology

## 2024-07-06 DIAGNOSIS — I480 Paroxysmal atrial fibrillation: Secondary | ICD-10-CM

## 2024-07-06 DIAGNOSIS — Z7901 Long term (current) use of anticoagulants: Secondary | ICD-10-CM

## 2024-07-06 NOTE — Telephone Encounter (Signed)
 Prescription refill request for Xarelto  received.  Indication:afib Last office visit:12/25 Weight:86.2  kg Age:73 Scr:1.01  11/25 CrCl:79.02  ml/min  Prescription refilled

## 2024-07-12 ENCOUNTER — Encounter: Payer: Self-pay | Admitting: Physician Assistant

## 2024-07-12 ENCOUNTER — Telehealth: Payer: Self-pay

## 2024-07-12 DIAGNOSIS — F5101 Primary insomnia: Secondary | ICD-10-CM

## 2024-07-12 MED ORDER — ZOLPIDEM TARTRATE 5 MG PO TABS: 5.0000 mg | ORAL_TABLET | Freq: Every evening | ORAL | 1 refills | Status: AC | PRN

## 2024-07-12 NOTE — Telephone Encounter (Signed)
 Requesting rx rf of zolpidem  5mg  tablet Last written 12/03/2023 Last OV 05/06/2024 Upcoming appt = none

## 2024-07-12 NOTE — Telephone Encounter (Signed)
 Opened in error

## 2024-07-23 ENCOUNTER — Ambulatory Visit (HOSPITAL_COMMUNITY)
Admission: RE | Admit: 2024-07-23 | Discharge: 2024-07-23 | Disposition: A | Discharge status: Home / Self Care | Source: Ambulatory Visit | Attending: Cardiology | Admitting: Cardiology

## 2024-07-23 DIAGNOSIS — I350 Nonrheumatic aortic (valve) stenosis: Secondary | ICD-10-CM | POA: Diagnosis present

## 2024-07-23 LAB — ECHOCARDIOGRAM COMPLETE
AR max vel: 1.8 cm2
AV Area VTI: 1.89 cm2
AV Area mean vel: 1.8 cm2
AV Mean grad: 16 mmHg
AV Peak grad: 29.6 mmHg
Ao pk vel: 2.72 m/s
Area-P 1/2: 2.93 cm2
S' Lateral: 3.2 cm

## 2024-07-23 MED ORDER — PERFLUTREN LIPID MICROSPHERE
1.0000 mL | INTRAVENOUS | Status: AC | PRN
  Administered 2024-07-23: 2 mL via INTRAVENOUS

## 2024-07-24 ENCOUNTER — Ambulatory Visit: Payer: Self-pay | Admitting: Cardiology

## 2024-07-24 DIAGNOSIS — I350 Nonrheumatic aortic (valve) stenosis: Secondary | ICD-10-CM

## 2024-07-24 DIAGNOSIS — I422 Other hypertrophic cardiomyopathy: Secondary | ICD-10-CM

## 2024-07-28 ENCOUNTER — Ambulatory Visit: Attending: Cardiology | Admitting: Student in an Organized Health Care Education/Training Program

## 2024-07-28 ENCOUNTER — Encounter: Payer: Self-pay | Admitting: Student in an Organized Health Care Education/Training Program

## 2024-07-28 VITALS — BP 128/62 | HR 75 | Ht 70.0 in | Wt 188.0 lb

## 2024-07-28 DIAGNOSIS — Z01812 Encounter for preprocedural laboratory examination: Secondary | ICD-10-CM

## 2024-07-28 DIAGNOSIS — I480 Paroxysmal atrial fibrillation: Secondary | ICD-10-CM

## 2024-07-28 NOTE — Patient Instructions (Signed)
 Medication Instructions:  Your physician recommends that you continue on your current medications as directed. Please refer to the Current Medication list given to you today.  *If you need a refill on your cardiac medications before your next appointment, please call your pharmacy*  Lab Work: BMET prior to CT If you have labs (blood work) drawn today and your tests are completely normal, you will receive your results only by: MyChart Message (if you have MyChart) OR A paper copy in the mail If you have any lab test that is abnormal or we need to change your treatment, we will call you to review the results.  Testing/Procedures: Your physician has requested that you have Left atrial appendage (LAA) closure device implantation is a procedure to put a small device in the LAA of the heart. The LAA is a small sac in the wall of the heart's left upper chamber. Blood clots can form in this area. The device, Watchman closes the LAA to help prevent a blood clot and stroke.    Your physician has requested that you have cardiac CT. Cardiac computed tomography (CT) is a painless test that uses an x-ray machine to take clear, detailed pictures of your heart. For further information please visit [email or web address] Please follow instruction sheet as given.   Follow-Up: At [facility], you and your health needs are our priority.  As part of our continuing mission to provide you with exceptional heart care, our providers are all part of one team.  This team includes your primary Cardiologist (physician) and Advanced Practice Providers or APPs (Physician Assistants and Nurse Practitioners) who all work together to provide you with the care you need, when you need it.  Your next appointment:   To be scheduled  We recommend signing up for the patient portal called MyChart.  Sign up information is provided on this After Visit Summary.  MyChart is used to connect with patients for Virtual Visits  (Telemedicine).  Patients are able to view lab/test results, encounter notes, upcoming appointments, etc.  Non-urgent messages can be sent to your provider as well.   To learn more about what you can do with MyChart, go to [email or web address].

## 2024-07-28 NOTE — Progress Notes (Signed)
 " Cardiology Office Note   Date: 07/28/24 ID:  Joel Ford, DOB 09-Jun-1951, MRN [id number] PCP: Antoniette Vermell LITTIE DEVONNA  [facility] Providers Cardiologist:  Redell Shallow, MD Electrophysiologist:  Donnice DELENA Primus, MD   History of Present Illness Joel Ford is a 74 y.o. male with paroxysmal AF, AAS, HFrEF, aortic atherosclerosis, severe microcytic anemia, large esophageal varices, rectal polyp s/p snare, diverticulosis, submucosal gastric varices, HLD, and hypothyroidism who presents for evaluation of atrial fibrillation and associated stroke risk reduction.  Discussed the use of AI scribe software for clinical note transcription with the patient, who gave verbal consent to proceed. History of Present Illness Joel Ford is a 74 year old male with atrial fibrillation and bleeding issues who presents for consultation regarding the Watchman procedure. He was referred by Dr. Shallow for evaluation of his atrial fibrillation and bleeding issues.  He has a history of atrial fibrillation and has experienced bleeding issues while on blood thinners. He is currently not on any blood thinners but has been on Xarelto  in the past. He seldom knows when he is in atrial fibrillation.  His wife is scheduled for knee replacement in two weeks and is considering the timing of the Watchman procedure in relation to this surgery as she wants to be able to help assist once he has his procedure. He has been anemic in the past and was hospitalized for receiving three units of blood.   ROS: GIB  Studies Reviewed  ECG review 07/28/24: NSR 75, PR 130, QRS 94, QT/c 406/453 01/30/24: NSR 94, PR 127, QRS 92, QT/c 342/428 10/24/23: NSR 62, PR 130, QRS 109, QT/c 468/476 10/02/22: NSR 79, PR 113, QRS 100, QT/c 389/446 07/2916: AF/RVR 121, QRS 86, QT/c 328/465  TTE Result date: 11/29/22 1. There is apical hypertrophy with apical akinesis. Left ventricular  ejection fraction, by estimation,  is 65 to 70%. The left ventricle has  normal function. The left ventricle has no regional wall motion  abnormalities. Left ventricular diastolic  parameters are consistent with Grade II diastolic dysfunction  (pseudonormalization).   2. Right ventricular systolic function is normal. The right ventricular  size is normal. There is normal pulmonary artery systolic pressure.   3. Left atrial size was mild to moderately dilated.   4. The mitral valve is normal in structure. Mild mitral valve  regurgitation. No evidence of mitral stenosis.   5. The aortic valve is normal in structure. There is moderate  calcification of the aortic valve. Aortic valve regurgitation is mild.  Mild aortic valve stenosis. Aortic valve area, by VTI measures 1.89 cm.  Aortic valve mean gradient measures 16.0 mmHg.  Aortic valve Vmax measures 2.72 m/s.   6. The inferior vena cava is normal in size with greater than 50%  respiratory variability, suggesting right atrial pressure of 3 mmHg.   Risk Assessment/Calculations  CHA2DS2-VASc Score = 2  This indicates a 2.2% annual risk of stroke. The patient's score is based upon: CHF History: 0 HTN History: 0 Diabetes History: 0 Stroke History: 0 Vascular Disease History: 1 Age Score: 1 Gender Score: 0  Physical Exam VS:  BP 128/62 (BP Location: Left Arm, Patient Position: Sitting, Cuff Size: Large)   Pulse 75   Ht 5' 10 (1.778 m)   Wt 188 lb (85.3 kg)   SpO2 99%   BMI 26.98 kg/m   Wt Readings from Last 3 Encounters:  07/28/24 188 lb (85.3 kg)  06/10/24 190 lb (86.2 kg)  05/26/24 192 lb (87.1 kg)    GEN: Well nourished, well developed in no acute distress CARDIAC: RRR, no murmurs, rubs, gallops RESPIRATORY:  Clear to auscultation without rales, wheezing or rhonchi  EXTREMITIES:  No edema; No deformity   ASSESSMENT AND PLAN Joel Ford is a 74 y.o. male with paroxysmal AF, AAS, HFrEF, aortic atherosclerosis, severe microcytic anemia, large esophageal  varices, rectal polyp s/p snare, diverticulosis, submucosal gastric varices, HLD, and hypothyroidism who presents for evaluation of atrial fibrillation and associated stroke risk reduction. Assessment & Plan Paroxysmal atrial fibrillation Asymptomatic episodes. Watchman procedure considered to reduce stroke risk and discontinue anticoagulation due to bleeding issues. Explained procedure, risks, and benefits. He is a good candidate. With him asx from paroxysmal AF and with issues with OAC I wouldn't consider concomitant procedure with ongoing bleeding risk.  - Schedule Watchman procedure for mid-March, after his wife's knee replacement and recovery  - Order CT scan to assess left atrial appendage anatomy. - Transition from Xarelto  to aspirin  and Plavix post-procedure for six months. - Provided contact information for any issues or scheduling changes.  History of anticoagulant-related bleeding Significant bleeding issues on anticoagulants, including anemia and hospitalization. Watchman procedure considered to mitigate long-term anticoagulation risks.  - Discontinue Xarelto  post-Watchman procedure. - Initiate aspirin  and Plavix post-procedure for six months.  I have seen Joel Ford in the office today who is being considered for a Watchman left atrial appendage closure device. I believe they will benefit from this procedure given their history of atrial fibrillation, CHA2DS2-VASc score of 2 and unadjusted ischemic stroke rate of 2.2% per year. Unfortunately, the patient is not felt to be a long term anticoagulation candidate secondary to recurrent GIB. The patient's chart has been reviewed and I feel that they would be a candidate for short term oral anticoagulation after Watchman implant.   It is my belief that after undergoing a LAA closure procedure, Joel Ford will not need long term anticoagulation which eliminates anticoagulation side effects and major bleeding risk.   Procedural risks for  the Watchman implant have been reviewed with the patient including a 0.5% risk of stroke, <1% risk of perforation and <1% risk of device embolization. Other risks include bleeding, vascular damage, tamponade, worsening renal function, and death. The patient understands these risk and wishes to proceed.    The published clinical data on the safety and effectiveness of WATCHMAN include but are not limited to the following: - Holmes DR, Jess BEARD, Sick P et al. for the PROTECT AF Investigators. Percutaneous closure of the left atrial appendage versus warfarin therapy for prevention of stroke in patients with atrial fibrillation: a randomised non-inferiority trial. Lancet 2009; 374: 534-42. GLENWOOD Jess BEARD, Doshi SK, Jonita VEAR Satchel D et al. on behalf of the PROTECT AF Investigators. Percutaneous Left Atrial Appendage Closure for Stroke Prophylaxis in Patients With Atrial Fibrillation 2.3-Year Follow-up of the PROTECT AF (Watchman Left Atrial Appendage System for Embolic Protection in Patients With Atrial Fibrillation) Trial. Circulation 2013; 127:720-729. - Alli O, Doshi S,  Kar S, Reddy VY, Sievert H et al. Quality of Life Assessment in the Randomized PROTECT AF (Percutaneous Closure of the Left Atrial Appendage Versus Warfarin Therapy for Prevention of Stroke in Patients With Atrial Fibrillation) Trial of Patients at Risk for Stroke With Nonvalvular Atrial Fibrillation. J Am Coll Cardiol 2013; 61:1790-8. GLENWOOD Satchel DR, Archer RAMAN, Price CHRISTELLA, Whisenant B, Sievert H, Doshi S, Huber K, Reddy V. Prospective randomized evaluation of the Watchman left atrial  appendage Device in patients with atrial fibrillation versus long-term warfarin therapy; the PREVAIL trial. Journal of the [employer] of Cardiology, Vol. 4, No. 1, 2014, 1-11. - Kar S, Doshi SK, Sadhu A, Horton R, Osorio J et al. Primary outcome evaluation of a next-generation left atrial appendage closure device: results from the PINNACLE FLX trial. Circulation  2021;143(18)1754-1762.   After today's visit with the patient which was dedicated solely for shared decision making visit regarding LAA closure device, the patient decided to proceed with the LAA appendage closure procedure. Prior to the procedure, I would like to obtain a gated CT scan of the chest with contrast timed for PV/LA visualization.   HAS-BLED score 2 Hypertension No  Abnormal renal and liver function (Dialysis, transplant, Cr >2.26 mg/dL /Cirrhosis or Bilirubin >2x Normal or AST/ALT/AP >3x Normal) No  Stroke No  Bleeding Yes  Labile INR (Unstable/high INR) No  Elderly (>65) Yes  Drugs or alcohol (>= 8 drinks/week, anti-plt or NSAID) No   CHA2DS2-VASc Score = 2  The patient's score is based upon: CHF History: 0 HTN History: 0 Diabetes History: 0 Stroke History: 0 Vascular Disease History: 1 Age Score: 1 Gender Score: 0  NYHA stage I   Dispo: RTC post Watchman   Signed, Donnice DELENA Primus, MD  "

## 2024-07-29 ENCOUNTER — Telehealth: Payer: Self-pay

## 2024-07-29 LAB — BASIC METABOLIC PANEL WITH GFR
BUN/Creatinine Ratio: 14 (ref 10–24)
BUN: 13 mg/dL (ref 8–27)
CO2: 22 mmol/L (ref 20–29)
Calcium: 8.9 mg/dL (ref 8.6–10.2)
Chloride: 107 mmol/L — ABNORMAL HIGH (ref 96–106)
Creatinine, Ser: 0.93 mg/dL (ref 0.76–1.27)
Glucose: 133 mg/dL — ABNORMAL HIGH (ref 70–99)
Potassium: 4.7 mmol/L (ref 3.5–5.2)
Sodium: 143 mmol/L (ref 134–144)
eGFR: 87 mL/min/1.73

## 2024-07-29 NOTE — Telephone Encounter (Signed)
 I called and left a message for a return call to schedule a Medicare Wellness visit.

## 2024-07-30 NOTE — Telephone Encounter (Addendum)
 Pt reports wife is having knee surgery the beginning of Feb and would like to wait a month or so before having testing.  Order for Cmri placed along with CBC due prior. Cmri instructions sent to pt as a letter in My Chart.

## 2024-08-18 ENCOUNTER — Inpatient Hospital Stay: Attending: Hematology and Oncology

## 2024-08-23 ENCOUNTER — Inpatient Hospital Stay: Admitting: Hematology and Oncology

## 2024-08-23 ENCOUNTER — Ambulatory Visit (HOSPITAL_COMMUNITY)
